# Patient Record
Sex: Male | Born: 1935 | Race: White | Hispanic: No | Marital: Married | Smoking: Former smoker
Health system: Southern US, Community
[De-identification: ages and names within clinical notes are randomized; demographics above are authoritative.]

## PROBLEM LIST (undated history)

## (undated) DIAGNOSIS — N189 Chronic kidney disease, unspecified: Secondary | ICD-10-CM

## (undated) DIAGNOSIS — M1990 Unspecified osteoarthritis, unspecified site: Secondary | ICD-10-CM

## (undated) DIAGNOSIS — J189 Pneumonia, unspecified organism: Secondary | ICD-10-CM

## (undated) DIAGNOSIS — I2510 Atherosclerotic heart disease of native coronary artery without angina pectoris: Secondary | ICD-10-CM

## (undated) DIAGNOSIS — F419 Anxiety disorder, unspecified: Secondary | ICD-10-CM

## (undated) DIAGNOSIS — R011 Cardiac murmur, unspecified: Secondary | ICD-10-CM

## (undated) DIAGNOSIS — I219 Acute myocardial infarction, unspecified: Secondary | ICD-10-CM

## (undated) DIAGNOSIS — I10 Essential (primary) hypertension: Secondary | ICD-10-CM

## (undated) DIAGNOSIS — K219 Gastro-esophageal reflux disease without esophagitis: Secondary | ICD-10-CM

## (undated) DIAGNOSIS — B0229 Other postherpetic nervous system involvement: Secondary | ICD-10-CM

## (undated) DIAGNOSIS — E7800 Pure hypercholesterolemia, unspecified: Secondary | ICD-10-CM

## (undated) DIAGNOSIS — IMO0002 Reserved for concepts with insufficient information to code with codable children: Secondary | ICD-10-CM

## (undated) DIAGNOSIS — N200 Calculus of kidney: Secondary | ICD-10-CM

## (undated) DIAGNOSIS — C801 Malignant (primary) neoplasm, unspecified: Secondary | ICD-10-CM

## (undated) DIAGNOSIS — R0602 Shortness of breath: Secondary | ICD-10-CM

## (undated) DIAGNOSIS — M4800 Spinal stenosis, site unspecified: Secondary | ICD-10-CM

## (undated) HISTORY — DX: Calculus of kidney: N20.0

## (undated) HISTORY — DX: Pure hypercholesterolemia, unspecified: E78.00

## (undated) HISTORY — PX: TONSILLECTOMY: SUR1361

## (undated) HISTORY — PX: LITHOTRIPSY: SUR834

## (undated) HISTORY — PX: HERNIA REPAIR: SHX51

## (undated) HISTORY — PX: NASAL SEPTUM SURGERY: SHX37

## (undated) HISTORY — PX: CORONARY ARTERY BYPASS GRAFT: SHX141

## (undated) HISTORY — DX: Other postherpetic nervous system involvement: B02.29

## (undated) HISTORY — PX: CARDIAC CATHETERIZATION: SHX172

## (undated) HISTORY — DX: Unspecified osteoarthritis, unspecified site: M19.90

---

## 1997-10-22 ENCOUNTER — Encounter (HOSPITAL_COMMUNITY): Admission: RE | Admit: 1997-10-22 | Discharge: 1998-01-20 | Payer: Self-pay | Admitting: Cardiology

## 1999-10-07 ENCOUNTER — Encounter: Payer: Self-pay | Admitting: Family Medicine

## 1999-10-07 ENCOUNTER — Encounter: Admission: RE | Admit: 1999-10-07 | Discharge: 1999-10-07 | Payer: Self-pay | Admitting: Family Medicine

## 1999-11-17 ENCOUNTER — Encounter: Admission: RE | Admit: 1999-11-17 | Discharge: 1999-11-17 | Payer: Self-pay | Admitting: Family Medicine

## 1999-11-17 ENCOUNTER — Encounter: Payer: Self-pay | Admitting: Family Medicine

## 2000-04-11 ENCOUNTER — Encounter (HOSPITAL_COMMUNITY): Admission: RE | Admit: 2000-04-11 | Discharge: 2000-07-10 | Payer: Self-pay | Admitting: Cardiology

## 2000-07-11 ENCOUNTER — Encounter (HOSPITAL_COMMUNITY): Admission: RE | Admit: 2000-07-11 | Discharge: 2000-10-09 | Payer: Self-pay | Admitting: Cardiology

## 2003-08-19 ENCOUNTER — Encounter: Admission: RE | Admit: 2003-08-19 | Discharge: 2003-08-19 | Payer: Self-pay | Admitting: Family Medicine

## 2003-08-21 ENCOUNTER — Encounter: Admission: RE | Admit: 2003-08-21 | Discharge: 2003-08-21 | Payer: Self-pay | Admitting: Family Medicine

## 2004-01-24 ENCOUNTER — Emergency Department (HOSPITAL_COMMUNITY): Admission: EM | Admit: 2004-01-24 | Discharge: 2004-01-24 | Payer: Self-pay | Admitting: Emergency Medicine

## 2004-02-06 ENCOUNTER — Ambulatory Visit (HOSPITAL_COMMUNITY): Admission: RE | Admit: 2004-02-06 | Discharge: 2004-02-06 | Payer: Self-pay | Admitting: Urology

## 2004-11-07 ENCOUNTER — Emergency Department (HOSPITAL_COMMUNITY): Admission: EM | Admit: 2004-11-07 | Discharge: 2004-11-08 | Payer: Self-pay | Admitting: Emergency Medicine

## 2004-12-17 ENCOUNTER — Encounter: Admission: RE | Admit: 2004-12-17 | Discharge: 2004-12-17 | Payer: Self-pay | Admitting: Urology

## 2004-12-23 ENCOUNTER — Ambulatory Visit (HOSPITAL_BASED_OUTPATIENT_CLINIC_OR_DEPARTMENT_OTHER): Admission: RE | Admit: 2004-12-23 | Discharge: 2004-12-23 | Payer: Self-pay | Admitting: Urology

## 2004-12-23 ENCOUNTER — Ambulatory Visit (HOSPITAL_COMMUNITY): Admission: RE | Admit: 2004-12-23 | Discharge: 2004-12-23 | Payer: Self-pay | Admitting: Urology

## 2004-12-24 ENCOUNTER — Emergency Department (HOSPITAL_COMMUNITY): Admission: EM | Admit: 2004-12-24 | Discharge: 2004-12-24 | Payer: Self-pay | Admitting: Emergency Medicine

## 2005-03-01 ENCOUNTER — Ambulatory Visit (HOSPITAL_COMMUNITY): Admission: RE | Admit: 2005-03-01 | Discharge: 2005-03-01 | Payer: Self-pay | Admitting: Urology

## 2005-03-01 ENCOUNTER — Ambulatory Visit (HOSPITAL_BASED_OUTPATIENT_CLINIC_OR_DEPARTMENT_OTHER): Admission: RE | Admit: 2005-03-01 | Discharge: 2005-03-01 | Payer: Self-pay | Admitting: Urology

## 2006-07-26 ENCOUNTER — Encounter (HOSPITAL_COMMUNITY): Admission: RE | Admit: 2006-07-26 | Discharge: 2006-10-24 | Payer: Self-pay | Admitting: Cardiology

## 2008-11-14 ENCOUNTER — Encounter: Admission: RE | Admit: 2008-11-14 | Discharge: 2008-11-14 | Payer: Self-pay | Admitting: Family Medicine

## 2010-06-10 ENCOUNTER — Inpatient Hospital Stay (HOSPITAL_COMMUNITY)
Admission: EM | Admit: 2010-06-10 | Discharge: 2010-06-13 | Payer: Self-pay | Source: Home / Self Care | Attending: Internal Medicine | Admitting: Internal Medicine

## 2010-06-11 ENCOUNTER — Encounter (INDEPENDENT_AMBULATORY_CARE_PROVIDER_SITE_OTHER): Payer: Self-pay | Admitting: Internal Medicine

## 2010-08-31 LAB — PROCALCITONIN: Procalcitonin: 1.65 ng/mL

## 2010-08-31 LAB — COMPREHENSIVE METABOLIC PANEL
Alkaline Phosphatase: 36 U/L — ABNORMAL LOW (ref 39–117)
BUN: 28 mg/dL — ABNORMAL HIGH (ref 6–23)
Calcium: 9.5 mg/dL (ref 8.4–10.5)
Glucose, Bld: 111 mg/dL — ABNORMAL HIGH (ref 70–99)
Total Protein: 5.5 g/dL — ABNORMAL LOW (ref 6.0–8.3)

## 2010-08-31 LAB — HEPATIC FUNCTION PANEL
Alkaline Phosphatase: 44 U/L (ref 39–117)
Total Bilirubin: 1 mg/dL (ref 0.3–1.2)
Total Protein: 6.1 g/dL (ref 6.0–8.3)

## 2010-08-31 LAB — POCT CARDIAC MARKERS
CKMB, poc: 4.2 ng/mL (ref 1.0–8.0)
Troponin i, poc: <0.05 ng/mL (ref 0.00–0.09)

## 2010-08-31 LAB — CULTURE, BLOOD (ROUTINE X 2): Culture: NO GROWTH

## 2010-08-31 LAB — URINALYSIS, ROUTINE W REFLEX MICROSCOPIC
Hgb urine dipstick: NEGATIVE
Nitrite: NEGATIVE

## 2010-08-31 LAB — CARDIAC PANEL(CRET KIN+CKTOT+MB+TROPI)
CK, MB: 6.6 ng/mL (ref 0.3–4.0)
Total CK: 245 U/L — ABNORMAL HIGH (ref 7–232)
Total CK: 293 U/L — ABNORMAL HIGH (ref 7–232)
Troponin I: 0.05 ng/mL (ref 0.00–0.06)
Troponin I: 0.06 ng/mL (ref 0.00–0.06)

## 2010-08-31 LAB — PROTIME-INR
INR: 1.12 (ref 0.00–1.49)
Prothrombin Time: 14.6 seconds (ref 11.6–15.2)

## 2010-08-31 LAB — POCT I-STAT, CHEM 8
BUN: 50 mg/dL — ABNORMAL HIGH (ref 6–23)
Chloride: 114 mEq/L — ABNORMAL HIGH (ref 96–112)
HCT: 52 % (ref 39.0–52.0)
Hemoglobin: 17.7 g/dL — ABNORMAL HIGH (ref 13.0–17.0)
Potassium: 4.2 mEq/L (ref 3.5–5.1)
Sodium: 142 mEq/L (ref 135–145)

## 2010-08-31 LAB — MRSA PCR SCREENING: MRSA by PCR: NEGATIVE

## 2010-08-31 LAB — BASIC METABOLIC PANEL
CO2: 19 mEq/L (ref 19–32)
Calcium: 8.9 mg/dL (ref 8.4–10.5)
Creatinine, Ser: 1.26 mg/dL (ref 0.4–1.5)
GFR calc Af Amer: >60 mL/min (ref >60)
GFR calc non Af Amer: 53 mL/min — ABNORMAL LOW (ref >60)

## 2010-08-31 LAB — TSH: TSH: 2.12 u[IU]/mL (ref 0.350–4.500)

## 2010-08-31 LAB — PROLACTIN: Prolactin: 6.6 ng/mL (ref 2.1–17.1)

## 2010-08-31 LAB — DIFFERENTIAL
Basophils Relative: 0 % (ref 0–1)
Eosinophils Absolute: 0 10*3/uL (ref 0.0–0.7)
Eosinophils Absolute: 0.1 10*3/uL (ref 0.0–0.7)
Eosinophils Relative: 1 % (ref 0–5)
Lymphs Abs: 1 10*3/uL (ref 0.7–4.0)
Monocytes Absolute: 0.4 10*3/uL (ref 0.1–1.0)
Monocytes Relative: 6 % (ref 3–12)
Neutrophils Relative %: 88 % — ABNORMAL HIGH (ref 43–77)

## 2010-08-31 LAB — URINE CULTURE

## 2010-08-31 LAB — LACTIC ACID, PLASMA
Lactic Acid, Venous: 3.1 mmol/L — ABNORMAL HIGH (ref 0.5–2.2)
Lactic Acid, Venous: 4.7 mmol/L — ABNORMAL HIGH (ref 0.5–2.2)

## 2010-08-31 LAB — CBC
HCT: 39.7 % (ref 39.0–52.0)
HCT: 50.8 % (ref 39.0–52.0)
Hemoglobin: 17.1 g/dL — ABNORMAL HIGH (ref 13.0–17.0)
MCH: 32 pg (ref 26.0–34.0)
MCH: 32.3 pg (ref 26.0–34.0)
MCHC: 33 g/dL (ref 30.0–36.0)
MCHC: 33.5 g/dL (ref 30.0–36.0)
MCV: 96.1 fL (ref 78.0–100.0)
Platelets: 135 10*3/uL — ABNORMAL LOW (ref 150–400)
RBC: 4.37 MIL/uL (ref 4.22–5.81)
RBC: 5.29 MIL/uL (ref 4.22–5.81)
RDW: 13.8 % (ref 11.5–15.5)
RDW: 14.2 % (ref 11.5–15.5)
WBC: 5.7 10*3/uL (ref 4.0–10.5)

## 2010-08-31 LAB — GIARDIA/CRYPTOSPORIDIUM SCREEN(EIA): Giardia Screen - EIA: NEGATIVE

## 2010-08-31 LAB — PHOSPHORUS: Phosphorus: 2.5 mg/dL (ref 2.3–4.6)

## 2010-08-31 LAB — BRAIN NATRIURETIC PEPTIDE: Pro B Natriuretic peptide (BNP): 57 pg/mL (ref 0.0–100.0)

## 2010-08-31 LAB — MAGNESIUM: Magnesium: 1.5 mg/dL (ref 1.5–2.5)

## 2010-11-06 NOTE — Op Note (Signed)
Shaun [name], Shaun [name]             ACCOUNT NO.:  [id number]   MEDICAL RECORD NO.:  [id number]          PATIENT TYPE:  AMB   LOCATION:  [facility]                         FACILITY:  [facility]   PHYSICIAN:  Courtney Paris, M.D.DATE OF BIRTH:  1914/08/26   DATE OF PROCEDURE:  03/01/2005  DATE OF DISCHARGE:                                 OPERATIVE REPORT   PREOPERATIVE DIAGNOSES:  Urinary retention secondary to benign prostatic  hypertrophy.   POSTOPERATIVE DIAGNOSES:  Urinary retention secondary to benign prostatic  hypertrophy.   OPERATION:  Suprapubic cystostomy, TUNA.   ANESTHESIA:  General.   SURGEON:  Courtney Paris, M.D.   BRIEF HISTORY:  This 77 year old patient has been in urinary retention since  he had a right ureteroscopy in early July. He has been unable to void  despite Proscar and Flomax and has failed numerous voiding trials. He comes  in now for a TUNA and a suprapubic tube so we can remove his Foley. 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   The patient was placed on the operating table in the dorsal lithotomy  position and after satisfactory induction of general anesthesia was shaved,  prepped and draped in the usual sterile fashion. The Foley catheter was  removed. A Lowsley tractor was then passed per urethra and then cut and  brought up in the suprapubic area where I could feel the tip of the  catheter. I cut down on this with a knife and was able to pop this through  the skin and then placed a #0 Prolene suture through the eye of the Lowsley  and also tied it to a #18 Foley catheter and pulled this back through into  the bladder and out the urethra. The stitch was then cut and then the  catheter was slowly pulled back into the bladder, the balloon inflated and  the tube irrigated to make sure it was located in the bladder. Next, the  cystoscope with the zero degree lens was then passed, no anterior strictures  were  seen. He did have a mildly obstructing prostate with about a 45 gram  prostate on rectal ultrasound with not a very large median lobe component,  mainly lateral lobe enlargement was noted.   The bladder was normal, 2+ trabeculated but otherwise no lesion seen. The  first stick of the needles with the TUNA was done at the mid bladder neck  with 14 mm of needle extension and the tissue was heated up with the machine  in a routine fashion. Two lateral bladder neck sticks were done with 18 mm  needle extension and then he had 2 more transverse left and right with 20 mm  of needle extension, 2 more transverse a little bit further back and then  two apex sticks with also 20 mm of needle extension. There were a total of 9  sticks which the patient tolerated well. The bleeding was very minimal. The  scope was removed and a #22 Foley catheter was inserted and the bladder  irrigated.  A small few clots  were returned but the irrigant was clear. The Foley was  then attached to a leg back. The suprapubic tube was clamped off and taped  to his lower abdomen with gauze. The patient was then returned to the  recovery room in good condition and will come to the office in 3 days and  will have his Foley removed at that time.      Courtney Paris, M.D.  Electronically Signed     HMK/MEDQ  D:  03/01/2005  T:  03/01/2005  Job:  [id number]

## 2011-05-29 ENCOUNTER — Emergency Department (HOSPITAL_COMMUNITY): Payer: Medicare Other

## 2011-05-29 ENCOUNTER — Encounter: Payer: Self-pay | Admitting: *Deleted

## 2011-05-29 ENCOUNTER — Inpatient Hospital Stay (HOSPITAL_COMMUNITY)
Admission: EM | Admit: 2011-05-29 | Discharge: 2011-05-31 | DRG: 153 | Disposition: A | Discharge status: Home / Self Care | Payer: Medicare Other | Attending: Internal Medicine | Admitting: Internal Medicine

## 2011-05-29 DIAGNOSIS — M5137 Other intervertebral disc degeneration, lumbosacral region: Secondary | ICD-10-CM | POA: Diagnosis present

## 2011-05-29 DIAGNOSIS — R0602 Shortness of breath: Secondary | ICD-10-CM | POA: Diagnosis present

## 2011-05-29 DIAGNOSIS — Z7982 Long term (current) use of aspirin: Secondary | ICD-10-CM

## 2011-05-29 DIAGNOSIS — J111 Influenza due to unidentified influenza virus with other respiratory manifestations: Principal | ICD-10-CM | POA: Diagnosis present

## 2011-05-29 DIAGNOSIS — M1990 Unspecified osteoarthritis, unspecified site: Secondary | ICD-10-CM | POA: Diagnosis present

## 2011-05-29 DIAGNOSIS — M51379 Other intervertebral disc degeneration, lumbosacral region without mention of lumbar back pain or lower extremity pain: Secondary | ICD-10-CM | POA: Diagnosis present

## 2011-05-29 DIAGNOSIS — J101 Influenza due to other identified influenza virus with other respiratory manifestations: Secondary | ICD-10-CM

## 2011-05-29 DIAGNOSIS — I2510 Atherosclerotic heart disease of native coronary artery without angina pectoris: Secondary | ICD-10-CM | POA: Diagnosis present

## 2011-05-29 DIAGNOSIS — R509 Fever, unspecified: Secondary | ICD-10-CM | POA: Diagnosis present

## 2011-05-29 DIAGNOSIS — I10 Essential (primary) hypertension: Secondary | ICD-10-CM

## 2011-05-29 HISTORY — DX: Atherosclerotic heart disease of native coronary artery without angina pectoris: I25.10

## 2011-05-29 HISTORY — DX: Reserved for concepts with insufficient information to code with codable children: IMO0002

## 2011-05-29 HISTORY — DX: Essential (primary) hypertension: I10

## 2011-05-29 HISTORY — DX: Unspecified osteoarthritis, unspecified site: M19.90

## 2011-05-29 HISTORY — DX: Cardiac murmur, unspecified: R01.1

## 2011-05-29 HISTORY — DX: Shortness of breath: R06.02

## 2011-05-29 HISTORY — DX: Spinal stenosis, site unspecified: M48.00

## 2011-05-29 LAB — CBC
HCT: 43.2 % (ref 39.0–52.0)
Hemoglobin: 14.3 g/dL (ref 13.0–17.0)
WBC: 11.4 10*3/uL — ABNORMAL HIGH (ref 4.0–10.5)

## 2011-05-29 LAB — BASIC METABOLIC PANEL
BUN: 22 mg/dL (ref 6–23)
Chloride: 105 mEq/L (ref 96–112)
Glucose, Bld: 122 mg/dL — ABNORMAL HIGH (ref 70–99)
Potassium: 4.1 mEq/L (ref 3.5–5.1)

## 2011-05-29 LAB — LACTIC ACID, PLASMA: Lactic Acid, Venous: 1.2 mmol/L (ref 0.5–2.2)

## 2011-05-29 MED ORDER — ACETAMINOPHEN 325 MG PO TABS
975.0000 mg | ORAL_TABLET | Freq: Once | ORAL | Status: AC
  Administered 2011-05-29: 975 mg via ORAL
  Filled 2011-05-29: qty 3

## 2011-05-29 MED ORDER — PIPERACILLIN-TAZOBACTAM 3.375 G IVPB
3.3750 g | Freq: Once | INTRAVENOUS | Status: AC
  Administered 2011-05-29: 3.375 g via INTRAVENOUS
  Filled 2011-05-29: qty 50

## 2011-05-29 MED ORDER — VANCOMYCIN HCL IN DEXTROSE 1-5 GM/200ML-% IV SOLN
1000.0000 mg | Freq: Once | INTRAVENOUS | Status: AC
  Administered 2011-05-29: 1000 mg via INTRAVENOUS
  Filled 2011-05-29: qty 200

## 2011-05-29 NOTE — ED Notes (Signed)
MD at bedside. 

## 2011-05-29 NOTE — ED Notes (Signed)
Patient sent here from [facility] In clinic for further evaluation of his fever, cough, and weakness.

## 2011-05-29 NOTE — ED Provider Notes (Signed)
History     CSN: [id number] Arrival date & time: 05/29/2011  7:21 PM   First MD Initiated Contact with Patient 05/29/11 1932      Chief Complaint  Patient presents with  . Fever  . Shortness of Breath  . Weakness    (Consider location/radiation/quality/duration/timing/severity/associated sxs/prior treatment) The history is provided by the patient and the spouse.   the patient was brought in by his family because of coughing congestion for several days with worsening shortness of breath today and development of generalized weakness and fever.  Found to have a temp of 102.7 rectally in the emergency department.  He denies nausea vomiting and diarrhea.  He denies chest pain.  She denies abdominal pain.  He denies new rash.  He denies neck pain or neck stiffness.  He denies headache.  Nothing worsens the symptoms.  Nothing improves his symptoms.  He's had no dysuria  Past Medical History  Diagnosis Date  . Hypertension     History reviewed. No pertinent past surgical history.  History reviewed. No pertinent family history.  History  Substance Use Topics  . Smoking status: Never Smoker   . Smokeless tobacco: Not on file  . Alcohol Use: No      Review of Systems  Constitutional: Positive for fever.  Respiratory: Positive for shortness of breath.   Neurological: Positive for weakness.  All other systems reviewed and are negative.    Allergies  Review of patient's allergies indicates no known allergies.  Home Medications   Current Outpatient Rx  Name Route Sig Dispense Refill  . ASPIRIN EC 325 MG PO TBEC Oral Take 325 mg by mouth daily.      . OCUVITE PO TABS Oral Take 1 tablet by mouth daily.      Marland Kitchen DICLOFENAC SODIUM 75 MG PO TBEC Oral Take 75 mg by mouth 2 (two) times daily.      Marland Kitchen EZETIMIBE-SIMVASTATIN 10-40 MG PO TABS Oral Take 1 tablet by mouth at bedtime.      Marland Kitchen GABAPENTIN 300 MG PO CAPS Oral Take 300 mg by mouth 3 (three) times daily.      . ISOSORBIDE  MONONITRATE ER 60 MG PO TB24 Oral Take 60 mg by mouth daily.      Marland Kitchen LORATADINE 10 MG PO TABS Oral Take 10 mg by mouth daily.      Marland Kitchen METOPROLOL SUCCINATE ER 50 MG PO TB24 Oral Take 50 mg by mouth daily.      Carma Leaven M PLUS PO TABS Oral Take 1 tablet by mouth daily.      Marland Kitchen NITROGLYCERIN 0.4 MG SL SUBL Sublingual Place 0.4 mg under the tongue every 5 (five) minutes as needed. For chest pain.     Marland Kitchen OMEPRAZOLE 20 MG PO CPDR Oral Take 20 mg by mouth daily.      Marland Kitchen OVER THE COUNTER MEDICATION Oral Take 2 capsules by mouth 2 (two) times daily. Herb-lax over the counter medication     . OVER THE COUNTER MEDICATION Oral Take 3 tablets by mouth daily.      Marland Kitchen POTASSIUM CITRATE 10 MEQ (1080 MG) PO TBCR Oral Take 10 mEq by mouth 2 (two) times daily.      Marland Kitchen VITAMIN C 500 MG PO TABS Oral Take 500 mg by mouth daily.      Marland Kitchen ZOLPIDEM TARTRATE 5 MG PO TABS Oral Take 5 mg by mouth at bedtime as needed. For sleep.       BP 90/77  Pulse 106  Temp(Src) 102.7 F (39.3 C) (Rectal)  Resp 18  Ht 5\' 6"  (1.676 m)  Wt 175 lb (79.379 kg)  BMI 28.25 kg/m2  SpO2 100%  Physical Exam  Nursing note and vitals reviewed. Constitutional: He is oriented to person, place, and time. He appears well-developed and well-nourished.  HENT:  Head: Normocephalic and atraumatic.  Eyes: EOM are normal.  Neck: Normal range of motion.  Cardiovascular: Regular rhythm, normal heart sounds and intact distal pulses.        tachycardia  Pulmonary/Chest: Effort normal and breath sounds normal. No respiratory distress.  Abdominal: Soft. He exhibits no distension. There is no tenderness.  Musculoskeletal: Normal range of motion.  Neurological: He is alert and oriented to person, place, and time.  Skin: Skin is warm and dry.  Psychiatric: He has a normal mood and affect. Judgment normal.    ED Course  Procedures (including critical care time)  Labs Reviewed  CBC - Abnormal; Notable for the following:    WBC 11.4 (*)    All other  components within normal limits  BASIC METABOLIC PANEL - Abnormal; Notable for the following:    Glucose, Bld 122 (*)    GFR calc non Af Amer 71 (*)    GFR calc Af Amer 82 (*)    All other components within normal limits  LACTIC ACID, PLASMA  PROCALCITONIN  CULTURE, BLOOD (ROUTINE X 2)  CULTURE, BLOOD (ROUTINE X 2)  URINALYSIS, ROUTINE W REFLEX MICROSCOPIC  URINE CULTURE  INFLUENZA PANEL BY PCR   Dg Chest 2 View  05/29/2011  *RADIOLOGY REPORT*  Clinical Data: Fever.  Cough.  Shortness of breath.  Generalized weakness.  Former smoker.  Prior CABG.  CHEST - 2 VIEW 05/29/2011:  Comparison: Portable chest x-ray 06/10/2010 [facility]. Two-view chest x-ray 12/17/2004 and 03/02 10/2003 [facility].  Findings: Prior sternotomy for CABG.  Cardiac silhouette normal in size.  Thoracic aorta tortuous and atherosclerotic, unchanged. Hilar and mediastinal contours otherwise unremarkable. Pleuroparenchymal scarring at both bases with blunting of the costophrenic angles, unchanged.  Calcified granuloma in the right mid lung, unchanged.  No new pulmonary parenchymal abnormalities. Pulmonary vascularity normal without evidence of pulmonary edema. Visualized bony thorax intact.  IMPRESSION: No acute cardiopulmonary disease.  Stable pleuroparenchymal scarring in the lung bases and calcified granuloma in the right mid lung.  Original Report Authenticated By: Arnell Sieving, M.D.     1. fever    MDM  Unclear etiology of fever. Pancultured. cxr and urine normal. No AMS to suggest need for LP. Rectal temp 102.7 Influenza titers sent. Will admit for observation  11:54 PM HR 101. BP 116/60 RR 16. Pulse ox 97% on 2 liters [place]       Lyanne Co, MD 05/29/11 2358

## 2011-05-30 ENCOUNTER — Encounter (HOSPITAL_COMMUNITY): Payer: Self-pay | Admitting: Internal Medicine

## 2011-05-30 DIAGNOSIS — R0602 Shortness of breath: Secondary | ICD-10-CM

## 2011-05-30 DIAGNOSIS — I10 Essential (primary) hypertension: Secondary | ICD-10-CM

## 2011-05-30 DIAGNOSIS — I2510 Atherosclerotic heart disease of native coronary artery without angina pectoris: Secondary | ICD-10-CM

## 2011-05-30 DIAGNOSIS — J101 Influenza due to other identified influenza virus with other respiratory manifestations: Secondary | ICD-10-CM

## 2011-05-30 LAB — BASIC METABOLIC PANEL
CO2: 25 mEq/L (ref 19–32)
Calcium: 9.9 mg/dL (ref 8.4–10.5)
Chloride: 105 mEq/L (ref 96–112)
Creatinine, Ser: 0.96 mg/dL (ref 0.50–1.35)
GFR calc Af Amer: 80 mL/min — ABNORMAL LOW (ref >90)
Sodium: 139 mEq/L (ref 135–145)

## 2011-05-30 LAB — URINE MICROSCOPIC-ADD ON

## 2011-05-30 LAB — CBC
MCV: 96.4 fL (ref 78.0–100.0)
Platelets: 171 10*3/uL (ref 150–400)
RBC: 4.18 MIL/uL — ABNORMAL LOW (ref 4.22–5.81)
RDW: 13.7 % (ref 11.5–15.5)
WBC: 9.9 10*3/uL (ref 4.0–10.5)

## 2011-05-30 LAB — INFLUENZA PANEL BY PCR (TYPE A & B): H1N1 flu by pcr: NOT DETECTED

## 2011-05-30 LAB — URINALYSIS, ROUTINE W REFLEX MICROSCOPIC
Bilirubin Urine: NEGATIVE
Glucose, UA: NEGATIVE mg/dL
Ketones, ur: 15 mg/dL — AB
pH: 5.5 (ref 5.0–8.0)

## 2011-05-30 MED ORDER — SENNOSIDES-DOCUSATE SODIUM 8.6-50 MG PO TABS: 1.0000 | ORAL_TABLET | Freq: Every evening | ORAL | Status: DC | PRN

## 2011-05-30 MED ORDER — OSELTAMIVIR PHOSPHATE 75 MG PO CAPS
75.0000 mg | ORAL_CAPSULE | Freq: Two times a day (BID) | ORAL | Status: DC
  Administered 2011-05-30 (×2): 75 mg via ORAL
  Filled 2011-05-30 (×4): qty 1

## 2011-05-30 MED ORDER — ACETAMINOPHEN 325 MG PO TABS: 650.0000 mg | ORAL_TABLET | Freq: Four times a day (QID) | ORAL | Status: DC | PRN

## 2011-05-30 MED ORDER — ACETAMINOPHEN 650 MG RE SUPP: 650.0000 mg | Freq: Four times a day (QID) | RECTAL | Status: DC | PRN

## 2011-05-30 MED ORDER — ALBUTEROL SULFATE (5 MG/ML) 0.5% IN NEBU
2.5000 mg | INHALATION_SOLUTION | Freq: Four times a day (QID) | RESPIRATORY_TRACT | Status: DC | PRN
  Administered 2011-05-30: 2.5 mg via RESPIRATORY_TRACT
  Filled 2011-05-30: qty 0.5

## 2011-05-30 MED ORDER — OSELTAMIVIR PHOSPHATE 75 MG PO CAPS
75.0000 mg | ORAL_CAPSULE | ORAL | Status: AC
  Administered 2011-05-30: 75 mg via ORAL
  Filled 2011-05-30: qty 1

## 2011-05-30 MED ORDER — VANCOMYCIN HCL 1000 MG IV SOLR
750.0000 mg | Freq: Two times a day (BID) | INTRAVENOUS | Status: DC
  Administered 2011-05-30: 750 mg via INTRAVENOUS
  Filled 2011-05-30 (×2): qty 750

## 2011-05-30 MED ORDER — ENOXAPARIN SODIUM 40 MG/0.4ML ~~LOC~~ SOLN
40.0000 mg | SUBCUTANEOUS | Status: DC
  Administered 2011-05-30: 40 mg via SUBCUTANEOUS
  Filled 2011-05-30 (×2): qty 0.4

## 2011-05-30 MED ORDER — ALUM & MAG HYDROXIDE-SIMETH 200-200-20 MG/5ML PO SUSP: 30.0000 mL | Freq: Four times a day (QID) | ORAL | Status: DC | PRN

## 2011-05-30 MED ORDER — OXYCODONE HCL 5 MG PO TABS: 5.0000 mg | ORAL_TABLET | ORAL | Status: DC | PRN

## 2011-05-30 MED ORDER — ONDANSETRON HCL 4 MG/2ML IJ SOLN: 4.0000 mg | Freq: Four times a day (QID) | INTRAMUSCULAR | Status: DC | PRN

## 2011-05-30 MED ORDER — GUAIFENESIN 100 MG/5ML PO SOLN
5.0000 mL | ORAL | Status: DC | PRN
  Administered 2011-05-30: 100 mg via ORAL
  Filled 2011-05-30: qty 5

## 2011-05-30 MED ORDER — ONDANSETRON HCL 4 MG PO TABS: 4.0000 mg | ORAL_TABLET | Freq: Four times a day (QID) | ORAL | Status: DC | PRN

## 2011-05-30 MED ORDER — SODIUM CHLORIDE 0.9 % IV SOLN
INTRAVENOUS | Status: DC
  Administered 2011-05-30: 04:00:00 via INTRAVENOUS

## 2011-05-30 MED ORDER — HYDROMORPHONE HCL PF 1 MG/ML IJ SOLN: 0.5000 mg | INTRAMUSCULAR | Status: DC | PRN

## 2011-05-30 MED ORDER — PIPERACILLIN-TAZOBACTAM 3.375 G IVPB
3.3750 g | Freq: Three times a day (TID) | INTRAVENOUS | Status: DC
  Administered 2011-05-30 (×2): 3.375 g via INTRAVENOUS
  Filled 2011-05-30 (×3): qty 50

## 2011-05-30 NOTE — ED Notes (Signed)
Pt with wheezing bil lungs; pt given PRN breathing treatment

## 2011-05-30 NOTE — Progress Notes (Signed)
Subjective: Feels much better than yesterday, although he still has some mild shortness of breath and myalgias. He has tested positive for influenza A.  Objective: Vital signs in last 24 hours: Temp:  [98.6 F (37 C)-102.7 F (39.3 C)] 98.8 F (37.1 C) (12/09 0545) Pulse Rate:  [94-136] 111  (12/09 0545) Resp:  [16-24] 16  (12/09 0545) BP: (90-142)/(48-109) 135/64 mmHg (12/09 0545) SpO2:  [91 %-100 %] 91 % (12/09 0545) Weight:  [79.379 kg (175 lb)] 175 lb (79.379 kg) (12/08 1922) Weight change:     Intake/Output from previous day: 12/08 0701 - 12/09 0700 In: 240 [P.O.:240] Out: -  Total I/O In: 240 [P.O.:240] Out: -    Physical Exam: General: Alert, awake, oriented x3, in no acute distress. HEENT: No bruits, no goiter. Heart: Regular rate and rhythm, without murmurs, rubs, gallops. Lungs: Clear to auscultation bilaterally. Abdomen: Soft, nontender, nondistended, positive bowel sounds. Extremities: No clubbing cyanosis or edema with positive pedal pulses. Neuro: Grossly intact, nonfocal.    Lab Results: Basic Metabolic Panel:  Basename 05/30/11 0620 05/29/11 2019  NA 139 137  K 3.9 4.1  CL 105 105  CO2 25 23  GLUCOSE 111* 122*  BUN 19 22  CREATININE 0.96 0.89  CALCIUM 9.9 10.1  MG -- --  PHOS -- --   CBC:  Basename 05/30/11 0620 05/29/11 2019  WBC 9.9 11.4*  NEUTROABS -- --  HGB 13.4 14.3  HCT 40.3 43.2  MCV 96.4 96.6  PLT 171 191    Studies/Results: Dg Chest 2 View  05/29/2011  *RADIOLOGY REPORT*  Clinical Data: Fever.  Cough.  Shortness of breath.  Generalized weakness.  Former smoker.  Prior CABG.  CHEST - 2 VIEW 05/29/2011:  Comparison: Portable chest x-ray 06/10/2010 [facility]. Two-view chest x-ray 12/17/2004 and 03/02 10/2003 [facility].  Findings: Prior sternotomy for CABG.  Cardiac silhouette normal in size.  Thoracic aorta tortuous and atherosclerotic, unchanged. Hilar and mediastinal contours otherwise unremarkable.  Pleuroparenchymal scarring at both bases with blunting of the costophrenic angles, unchanged.  Calcified granuloma in the right mid lung, unchanged.  No new pulmonary parenchymal abnormalities. Pulmonary vascularity normal without evidence of pulmonary edema. Visualized bony thorax intact.  IMPRESSION: No acute cardiopulmonary disease.  Stable pleuroparenchymal scarring in the lung bases and calcified granuloma in the right mid lung.  Original Report Authenticated By: Arnell Sieving, M.D.    Medications: Scheduled Meds:   . acetaminophen  975 mg Oral Once  . enoxaparin  40 mg Subcutaneous Q24H  . oseltamivir  75 mg Oral To Minor  . oseltamivir  75 mg Oral BID  . piperacillin-tazobactam (ZOSYN)  IV  3.375 g Intravenous Once  . piperacillin-tazobactam (ZOSYN)  IV  3.375 g Intravenous Q8H  . vancomycin  750 mg Intravenous Q12H  . vancomycin  1,000 mg Intravenous Once   Continuous Infusions:   . sodium chloride 75 mL/hr at 05/30/11 0343   PRN Meds:.acetaminophen, acetaminophen, albuterol, alum & mag hydroxide-simeth, guaiFENesin, HYDROmorphone, ondansetron (ZOFRAN) IV, ondansetron, oxyCODONE, senna-docusate  Assessment/Plan:  Principal Problem:  *Influenza A Active Problems:  SOB (shortness of breath)  HTN (hypertension)  CAD (coronary artery disease)    #1 influenza: He has tested positive for influenza A. This is likely the etiology of his shortness of breath and fevers. His chest x-ray shows no evidence for pneumonia so will go ahead and discontinue antibiotics and continue with Tamiflu only. If he maintains afebrile overnight and can potentially discharge home in the morning.  LOS: 1 day   Chaya Jan Pager: [phone] 05/30/2011, 1:48 PM

## 2011-05-30 NOTE — ED Notes (Signed)
Patient to be moved to yellow for holding waiting for admit orders report given to nurse

## 2011-05-30 NOTE — Progress Notes (Signed)
ANTIBIOTIC CONSULT NOTE - INITIAL  Pharmacy Consult for Vancocin/Zosyn Indication: empiric  No Known Allergies  Patient Measurements: Height: 5\' 6"  (167.6 cm) Weight: 175 lb (79.379 kg) IBW/kg (Calculated) : 63.8   Vital Signs: Temp: 98.7 F (37.1 C) (12/09 0015) Temp src: Oral (12/09 0015) BP: 120/73 mmHg (12/09 0028) Pulse Rate: 99  (12/09 0028)  Labs:  Basename 05/29/11 2019  WBC 11.4*  HGB 14.3  PLT 191  LABCREA --  CREATININE 0.89   Estimated Creatinine Clearance: 50.2 ml/min (by C-G formula based on Cr of 0.89).  Microbiology: No results found for this or any previous visit (from the past 720 hour(s)).  Medical History: Past Medical History  Diagnosis Date  . Hypertension    Medications:  Pending  Assessment: 77yo male c/o coughing and chest congestion associated with worsening SOB and generalized weakness and fever, found with elevated WBC, initial CXR negative for PNA, to begin IV ABX empirically for fever/leukocytosis of unclear etiology.  Goal of Therapy:  Vancomycin trough level 15-20 mcg/ml  Plan:  Rec'd vanc 1g and Zosyn 3.375g in ED.  Will continue therapy with vancomycin 750mg  IV Q12H and Zosyn 3.375g IV Q8H.  Monitor CBC, Cx, levels prn.  Colleen Can PharmD BCPS 05/30/2011,1:20 AM

## 2011-05-30 NOTE — H&P (Signed)
DATE OF ADMISSION:  05/30/2011  PCP:    No primary provider on file.   Chief Complaint:    HPI: Shaun [name] is an 77 y.o. male presenting with complaints of SOB, increased Cough and Chest Congestion along with Fevers and Chills X 2 days.  He had a Temperature to 102.7.  He denies and loss of appetite, nausea or vomiting or diarrhea.  He reports not being able to cough up any mucous.        Past Medical History  Diagnosis Date  . Hypertension   . CAD (coronary artery disease)   . Osteoarthritis   . DDD (degenerative disc disease)   . Spinal stenosis     Past Surgical History  Procedure Date  . Coronary artery bypass graft     Medications:  HOME MEDS: Prior to Admission medications   Medication Sig Start Date End Date Taking? Authorizing Provider  aspirin EC 325 MG tablet Take 325 mg by mouth daily.     Yes Historical Provider, MD  beta carotene w/minerals (OCUVITE) tablet Take 1 tablet by mouth daily.     Yes Historical Provider, MD  diclofenac (VOLTAREN) 75 MG EC tablet Take 75 mg by mouth 2 (two) times daily.     Yes Historical Provider, MD  ezetimibe-simvastatin (VYTORIN) 10-40 MG per tablet Take 1 tablet by mouth at bedtime.     Yes Historical Provider, MD  gabapentin (NEURONTIN) 300 MG capsule Take 300 mg by mouth 3 (three) times daily.     Yes Historical Provider, MD  isosorbide mononitrate (IMDUR) 60 MG 24 hr tablet Take 60 mg by mouth daily.     Yes Historical Provider, MD  loratadine (CLARITIN) 10 MG tablet Take 10 mg by mouth daily.     Yes Historical Provider, MD  metoprolol (TOPROL-XL) 50 MG 24 hr tablet Take 50 mg by mouth daily.     Yes Historical Provider, MD  Multiple Vitamins-Minerals (MULTIVITAMINS THER. W/MINERALS) TABS Take 1 tablet by mouth daily.     Yes Historical Provider, MD  nitroGLYCERIN (NITROSTAT) 0.4 MG SL tablet Place 0.4 mg under the tongue every 5 (five) minutes as needed. For chest pain.    Yes Historical Provider, MD  omeprazole  (PRILOSEC) 20 MG capsule Take 20 mg by mouth daily.     Yes Historical Provider, MD  OVER THE COUNTER MEDICATION Take 2 capsules by mouth 2 (two) times daily. Herb-lax over the counter medication    Yes Historical Provider, MD  OVER THE COUNTER MEDICATION Take 3 tablets by mouth daily.     Yes Historical Provider, MD  potassium citrate (UROCIT-K) 10 MEQ (1080 MG) SR tablet Take 10 mEq by mouth 2 (two) times daily.     Yes Historical Provider, MD  vitamin C (ASCORBIC ACID) 500 MG tablet Take 500 mg by mouth daily.     Yes Historical Provider, MD  zolpidem (AMBIEN) 5 MG tablet Take 5 mg by mouth at bedtime as needed. For sleep.    Yes Historical Provider, MD    Allergies:  No Known Allergies  Social History:   reports that he has never smoked. He does not have any smokeless tobacco history on file. He reports that he does not drink alcohol. His drug history not on file.  Family History: Family History  Problem Relation Age of Onset  . Cancer Father   . Coronary artery disease Father   . Stroke Mother     Review of Systems:  The patient  denies anorexia, fever, weight loss, vision loss, decreased hearing, hoarseness, chest pain, syncope, dyspnea on exertion, peripheral edema, balance deficits, hemoptysis, abdominal pain, melena, hematochezia, severe indigestion/heartburn, hematuria, incontinence, genital sores, muscle weakness, suspicious skin lesions, transient blindness, difficulty walking, depression, unusual weight change, abnormal bleeding, enlarged lymph nodes, angioedema.   Physical Exam:  GEN:  Pleasant examined  and in no acute distress; cooperative with exam Filed Vitals:   05/29/11 2230 05/29/11 2348 05/30/11 0015 05/30/11 0028  BP: 105/80 90/77 109/48 120/73  Pulse: 101 106 94 99  Temp:   98.7 F (37.1 C)   TempSrc:   Oral   Resp:  18  24  Height:      Weight:      SpO2: 96% 100% 96% 94%   Blood pressure 120/73, pulse 99, temperature 98.7 F (37.1 C), temperature  source Oral, resp. rate 24, height 5\' 6"  (1.676 m), weight 79.379 kg (175 lb), SpO2 94.00%. PSYCH: He is alert and oriented x4; does not appear anxious does not appear depressed; affect is normal HEENT: Normocephalic and Atraumatic, Mucous membranes pink; PERRLA; EOM intact; Fundi:  Benign;  No scleral icterus, Nares: Patent, Oropharynx: Clear, Fair Dentition, Neck:  FROM, no cervical lymphadenopathy nor thyromegaly or carotid bruit; no JVD; Breasts:: Not examined CHEST WALL: No tenderness CHEST: Normal respiration, clear to auscultation bilaterally HEART: Regular rate and rhythm; no murmurs rubs or gallops BACK: No kyphosis or scoliosis; no CVA tenderness ABDOMEN: Positive Bowel Sounds, Obese, soft non-tender; no masses, no organomegaly. Rectal Exam: Not done EXTREMITIES: No bone or joint deformity; age-appropriate arthropathy of the hands and knees; no cyanosis, clubbing or edema; no ulcerations. Genitalia: not examined PULSES: 2+ and symmetric SKIN: Normal hydration no rash or ulceration CNS: Cranial nerves 2-12 grossly intact no focal neurologic deficit   Labs & Imaging Results for orders placed during the hospital encounter of 05/29/11 (from the past 48 hour(s))  CBC     Status: Abnormal   Collection Time   05/29/11  8:19 PM      Component Value Range Comment   WBC 11.4 (*) 4.0 - 10.5 (K/uL)    RBC 4.47  4.22 - 5.81 (MIL/uL)    Hemoglobin 14.3  13.0 - 17.0 (g/dL)    HCT 04.5  40.9 - 81.1 (%)    MCV 96.6  78.0 - 100.0 (fL)    MCH 32.0  26.0 - 34.0 (pg)    MCHC 33.1  30.0 - 36.0 (g/dL)    RDW 91.4  78.2 - 95.6 (%)    Platelets 191  150 - 400 (K/uL)   BASIC METABOLIC PANEL     Status: Abnormal   Collection Time   05/29/11  8:19 PM      Component Value Range Comment   Sodium 137  135 - 145 (mEq/L)    Potassium 4.1  3.5 - 5.1 (mEq/L)    Chloride 105  96 - 112 (mEq/L)    CO2 23  19 - 32 (mEq/L)    Glucose, Bld 122 (*) 70 - 99 (mg/dL)    BUN 22  6 - 23 (mg/dL)    Creatinine, Ser  2.13  0.50 - 1.35 (mg/dL)    Calcium 08.6  8.4 - 10.5 (mg/dL)    GFR calc non Af Amer 71 (*) >90 (mL/min)    GFR calc Af Amer 82 (*) >90 (mL/min)   LACTIC ACID, PLASMA     Status: Normal   Collection Time   05/29/11  8:20 PM  Component Value Range Comment   Lactic Acid, Venous 1.2  0.5 - 2.2 (mmol/L)   PROCALCITONIN     Status: Normal   Collection Time   05/29/11  8:21 PM      Component Value Range Comment   Procalcitonin 0.10     URINALYSIS, ROUTINE W REFLEX MICROSCOPIC     Status: Abnormal   Collection Time   05/29/11 11:50 PM      Component Value Range Comment   Color, Urine YELLOW  YELLOW     APPearance CLEAR  CLEAR     Specific Gravity, Urine 1.020  1.005 - 1.030     pH 5.5  5.0 - 8.0     Glucose, UA NEGATIVE  NEGATIVE (mg/dL)    Hgb urine dipstick NEGATIVE  NEGATIVE     Bilirubin Urine NEGATIVE  NEGATIVE     Ketones, ur 15 (*) NEGATIVE (mg/dL)    Protein, ur NEGATIVE  NEGATIVE (mg/dL)    Urobilinogen, UA 0.2  0.0 - 1.0 (mg/dL)    Nitrite NEGATIVE  NEGATIVE     Leukocytes, UA TRACE (*) NEGATIVE    URINE MICROSCOPIC-ADD ON     Status: Normal   Collection Time   05/29/11 11:50 PM      Component Value Range Comment   WBC, UA 3-6  <3 (WBC/hpf)    RBC / HPF 0-2  <3 (RBC/hpf)    Bacteria, UA RARE  RARE     Dg Chest 2 View  05/29/2011  *RADIOLOGY REPORT*  Clinical Data: Fever.  Cough.  Shortness of breath.  Generalized weakness.  Former smoker.  Prior CABG.  CHEST - 2 VIEW 05/29/2011:  Comparison: Portable chest x-ray 06/10/2010 [facility]. Two-view chest x-ray 12/17/2004 and 03/02 10/2003 [facility].  Findings: Prior sternotomy for CABG.  Cardiac silhouette normal in size.  Thoracic aorta tortuous and atherosclerotic, unchanged. Hilar and mediastinal contours otherwise unremarkable. Pleuroparenchymal scarring at both bases with blunting of the costophrenic angles, unchanged.  Calcified granuloma in the right mid lung, unchanged.  No new pulmonary parenchymal  abnormalities. Pulmonary vascularity normal without evidence of pulmonary edema. Visualized bony thorax intact.  IMPRESSION: No acute cardiopulmonary disease.  Stable pleuroparenchymal scarring in the lung bases and calcified granuloma in the right mid lung.  Original Report Authenticated By: Arnell Sieving, M.D.      Assessment/Plan:  1.  Fever-  Early Pneumonia versus Influenzae versus Acute Bronchitis -  Blood Cultures X 2, IV Vancomycin and Zosyn started, and Oral Tamiflu given, and droplet precautions initiated. Antipyretics ordered.    2.  SOB due to #1, Nebs and supplemental O2 PRN.   3.  Tachycardia - Improving with IVFs.   4.  Leukocytosis - Due to infectious process.   5.  CAD hx - Stable  6.  Osteoarthritis Hx - Stable.  7.  Spinal Stenosis with Chronic Back Pain Hx-Stable.  8.  Other plans as per orders.    CODE STATUS:      FULL CODE        Shaylene Paganelli C 05/30/2011, 1:46 AM

## 2011-05-31 LAB — BASIC METABOLIC PANEL
CO2: 27 mEq/L (ref 19–32)
Chloride: 104 mEq/L (ref 96–112)
GFR calc Af Amer: 84 mL/min — ABNORMAL LOW (ref >90)
Potassium: 3.6 mEq/L (ref 3.5–5.1)

## 2011-05-31 LAB — URINE CULTURE
Colony Count: NO GROWTH
Culture  Setup Time: 201212091127

## 2011-05-31 LAB — CBC
HCT: 41.4 % (ref 39.0–52.0)
Hemoglobin: 13.6 g/dL (ref 13.0–17.0)
MCV: 96.7 fL (ref 78.0–100.0)
RBC: 4.28 MIL/uL (ref 4.22–5.81)
WBC: 6 10*3/uL (ref 4.0–10.5)

## 2011-05-31 MED ORDER — OSELTAMIVIR PHOSPHATE 75 MG PO CAPS: 75.0000 mg | ORAL_CAPSULE | Freq: Two times a day (BID) | ORAL | Status: AC

## 2011-05-31 NOTE — Discharge Summary (Signed)
Physician Discharge Summary  Patient ID: Shaun [name] MRN: [id number] DOB/AGE: 1914/11/28 77 y.o.  Admit date: 05/29/2011 Discharge date: 05/31/2011  Primary Care Physician:  No primary provider on file.   Discharge Diagnoses:    Principal Problem:  *Influenza Shaun Active Problems:  SOB (shortness of breath)  HTN (hypertension)  CAD (coronary artery disease)    Discharge Medication List as of 05/31/2011  9:22 AM    START taking these medications   Details  oseltamivir (TAMIFLU) 75 MG capsule Take 1 capsule (75 mg total) by mouth 2 (two) times daily., Starting 05/31/2011, Until Thu 06/10/11, Print      CONTINUE these medications which have NOT CHANGED   Details  aspirin EC 325 MG tablet Take 325 mg by mouth daily.  , Until Discontinued, Historical Med    !! beta carotene w/minerals (OCUVITE) tablet Take 1 tablet by mouth daily.  , Until Discontinued, Historical Med    diclofenac (VOLTAREN) 75 MG EC tablet Take 75 mg by mouth 2 (two) times daily.  , Until Discontinued, Historical Med    ezetimibe-simvastatin (VYTORIN) 10-40 MG per tablet Take 1 tablet by mouth at bedtime.  , Until Discontinued, Historical Med    gabapentin (NEURONTIN) 300 MG capsule Take 300 mg by mouth 3 (three) times daily.  , Until Discontinued, Historical Med    isosorbide mononitrate (IMDUR) 60 MG 24 hr tablet Take 60 mg by mouth daily.  , Until Discontinued, Historical Med    loratadine (CLARITIN) 10 MG tablet Take 10 mg by mouth daily.  , Until Discontinued, Historical Med    metoprolol (TOPROL-XL) 50 MG 24 hr tablet Take 50 mg by mouth daily.  , Until Discontinued, Historical Med    !! Multiple Vitamins-Minerals (MULTIVITAMINS THER. W/MINERALS) TABS Take 1 tablet by mouth daily.  , Until Discontinued, Historical Med    nitroGLYCERIN (NITROSTAT) 0.4 MG SL tablet Place 0.4 mg under the tongue every 5 (five) minutes as needed. For chest pain. , Until Discontinued, Historical Med    omeprazole  (PRILOSEC) 20 MG capsule Take 20 mg by mouth daily.  , Until Discontinued, Historical Med    potassium citrate (UROCIT-K) 10 MEQ (1080 MG) SR tablet Take 10 mEq by mouth 2 (two) times daily.  , Until Discontinued, Historical Med    vitamin C (ASCORBIC ACID) 500 MG tablet Take 500 mg by mouth daily.  , Until Discontinued, Historical Med    zolpidem (AMBIEN) 5 MG tablet Take 5 mg by mouth at bedtime as needed. For sleep. , Until Discontinued, Historical Med     !! - Potential duplicate medications found. Please discuss with provider.    STOP taking these medications     OVER THE COUNTER MEDICATION      OVER THE COUNTER MEDICATION          Disposition and Follow-up:  Patient has been discharged today in stable and improved condition.  Consults:  none    Significant Diagnostic Studies:  Dg Chest 2 View  05/29/2011  *RADIOLOGY REPORT*  Clinical Data: Fever.  Cough.  Shortness of breath.  Generalized weakness.  Former smoker.  Prior CABG.  CHEST - 2 VIEW 05/29/2011:  Comparison: Portable chest x-ray 06/10/2010 [facility]. Two-view chest x-ray 12/17/2004 and 03/02 10/2003 [facility].  Findings: Prior sternotomy for CABG.  Cardiac silhouette normal in size.  Thoracic aorta tortuous and atherosclerotic, unchanged. Hilar and mediastinal contours otherwise unremarkable. Pleuroparenchymal scarring at both bases with blunting of the costophrenic angles, unchanged.  Calcified granuloma in the right mid lung, unchanged.  No new pulmonary parenchymal abnormalities. Pulmonary vascularity normal without evidence of pulmonary edema. Visualized bony thorax intact.  IMPRESSION: No acute cardiopulmonary disease.  Stable pleuroparenchymal scarring in the lung bases and calcified granuloma in the right mid lung.  Original Report Authenticated By: Arnell Sieving, M.D.    Brief H and P: For complete details please refer to admission H and P, but in brief patient is Shaun pleasant 77 year old  white [name] who looks much younger than his stated age who presented to the hospital with fever, shortness of breath, cough for 2 days, had Shaun temperature of 102.7. Because of this was admitted for further evaluation and management.    Hospital Course:  Principal Problem:  *Influenza Shaun Active Problems:  SOB (shortness of breath)  HTN (hypertension)  CAD (coronary artery disease)   #1 influenza: He did test positive for influenza Shaun. This viral illness can certainly explain all his symptoms. His chest x-ray was clear without evidence for pneumonia. He has been discharged on Tamiflu.  All rest of his chronic medical conditions have been stable and his home medications have not been changed.  Time spent on Discharge: Greater than 30 minutes.  SignedChaya Jan [facility] Pager: [phone] 05/31/2011, 5:47 PM

## 2011-05-31 NOTE — Progress Notes (Signed)
   CARE MANAGEMENT NOTE 05/31/2011  Patient:  Shaun [name], Shaun [name]   Account Number:  [id number]  Date Initiated:  05/31/2011  Documentation initiated by:  Letha Cape  Subjective/Objective Assessment:   dx flu  admit- lives with spouse.     Action/Plan:   Anticipated DC Date:  05/31/2011   Anticipated DC Plan:  HOME/SELF CARE      DC Planning Services  CM consult      Choice offered to / List presented to:             Status of service:  Completed, signed off Medicare Important Message given?   (If response is "NO", the following Medicare IM given date fields will be blank) Date Medicare IM given:   Date Additional Medicare IM given:    Discharge Disposition:  HOME/SELF CARE  Per UR Regulation:    Comments:  05/31/11 13:56 Debotrah Ladona Ridgel RN, BSN [phone] patient lives with spouse, discharged home with spouse today.

## 2011-05-31 NOTE — Progress Notes (Signed)
Utilization review completed.  

## 2011-06-05 LAB — CULTURE, BLOOD (ROUTINE X 2)
Culture  Setup Time: 201212090119
Culture: NO GROWTH

## 2011-11-05 ENCOUNTER — Encounter (HOSPITAL_COMMUNITY): Payer: Self-pay | Admitting: Adult Health

## 2011-11-05 ENCOUNTER — Inpatient Hospital Stay: Admission: AD | Admit: 2011-11-05 | Payer: Self-pay | Source: Ambulatory Visit | Admitting: Interventional Cardiology

## 2011-11-05 ENCOUNTER — Inpatient Hospital Stay (HOSPITAL_COMMUNITY)
Admission: EM | Admit: 2011-11-05 | Discharge: 2011-11-17 | DRG: 280 | Disposition: A | Discharge status: Skilled Nursing Facility | Payer: MEDICARE | Attending: Interventional Cardiology | Admitting: Interventional Cardiology

## 2011-11-05 ENCOUNTER — Other Ambulatory Visit: Payer: Self-pay | Admitting: Interventional Cardiology

## 2011-11-05 DIAGNOSIS — G4700 Insomnia, unspecified: Secondary | ICD-10-CM | POA: Diagnosis present

## 2011-11-05 DIAGNOSIS — B961 Klebsiella pneumoniae [K. pneumoniae] as the cause of diseases classified elsewhere: Secondary | ICD-10-CM | POA: Diagnosis present

## 2011-11-05 DIAGNOSIS — I2119 ST elevation (STEMI) myocardial infarction involving other coronary artery of inferior wall: Principal | ICD-10-CM | POA: Diagnosis present

## 2011-11-05 DIAGNOSIS — I350 Nonrheumatic aortic (valve) stenosis: Secondary | ICD-10-CM

## 2011-11-05 DIAGNOSIS — I2510 Atherosclerotic heart disease of native coronary artery without angina pectoris: Secondary | ICD-10-CM

## 2011-11-05 DIAGNOSIS — E785 Hyperlipidemia, unspecified: Secondary | ICD-10-CM

## 2011-11-05 DIAGNOSIS — I509 Heart failure, unspecified: Secondary | ICD-10-CM | POA: Diagnosis present

## 2011-11-05 DIAGNOSIS — I359 Nonrheumatic aortic valve disorder, unspecified: Secondary | ICD-10-CM | POA: Diagnosis present

## 2011-11-05 DIAGNOSIS — I4891 Unspecified atrial fibrillation: Secondary | ICD-10-CM | POA: Diagnosis present

## 2011-11-05 DIAGNOSIS — N390 Urinary tract infection, site not specified: Secondary | ICD-10-CM | POA: Diagnosis present

## 2011-11-05 DIAGNOSIS — I4892 Unspecified atrial flutter: Secondary | ICD-10-CM | POA: Diagnosis present

## 2011-11-05 DIAGNOSIS — Z66 Do not resuscitate: Secondary | ICD-10-CM | POA: Diagnosis present

## 2011-11-05 DIAGNOSIS — I10 Essential (primary) hypertension: Secondary | ICD-10-CM | POA: Diagnosis present

## 2011-11-05 DIAGNOSIS — I5023 Acute on chronic systolic (congestive) heart failure: Secondary | ICD-10-CM | POA: Diagnosis present

## 2011-11-05 HISTORY — DX: Acute myocardial infarction, unspecified: I21.9

## 2011-11-05 LAB — CBC
HCT: 40.5 % (ref 39.0–52.0)
Hemoglobin: 13.9 g/dL (ref 13.0–17.0)
RDW: 13.2 % (ref 11.5–15.5)
WBC: 12.5 10*3/uL — ABNORMAL HIGH (ref 4.0–10.5)

## 2011-11-05 LAB — CARDIAC PANEL(CRET KIN+CKTOT+MB+TROPI): Relative Index: 10.7 — ABNORMAL HIGH (ref 0.0–2.5)

## 2011-11-05 MED ORDER — NITROGLYCERIN 0.4 MG SL SUBL
0.4000 mg | SUBLINGUAL_TABLET | SUBLINGUAL | Status: DC | PRN
  Administered 2011-11-08 (×2): 0.4 mg via SUBLINGUAL
  Filled 2011-11-05: qty 25

## 2011-11-05 MED ORDER — SODIUM CHLORIDE 0.9 % IV SOLN
INTRAVENOUS | Status: DC
  Administered 2011-11-05 – 2011-11-08 (×2): via INTRAVENOUS

## 2011-11-05 MED ORDER — HEPARIN BOLUS VIA INFUSION
3000.0000 [IU] | Freq: Once | INTRAVENOUS | Status: AC
  Administered 2011-11-05: 3000 [IU] via INTRAVENOUS

## 2011-11-05 MED ORDER — ONDANSETRON HCL 4 MG/2ML IJ SOLN: 4.0000 mg | Freq: Four times a day (QID) | INTRAMUSCULAR | Status: DC | PRN

## 2011-11-05 MED ORDER — ASPIRIN 300 MG RE SUPP: 300.0000 mg | RECTAL | Status: AC

## 2011-11-05 MED ORDER — ACETAMINOPHEN 325 MG PO TABS
650.0000 mg | ORAL_TABLET | ORAL | Status: DC | PRN
  Administered 2011-11-06 – 2011-11-17 (×5): 650 mg via ORAL
  Filled 2011-11-05 (×5): qty 2

## 2011-11-05 MED ORDER — ASPIRIN EC 81 MG PO TBEC
81.0000 mg | DELAYED_RELEASE_TABLET | Freq: Every day | ORAL | Status: DC
  Administered 2011-11-06 – 2011-11-17 (×12): 81 mg via ORAL
  Filled 2011-11-05 (×12): qty 1

## 2011-11-05 MED ORDER — NITROGLYCERIN IN D5W 200-5 MCG/ML-% IV SOLN
3.0000 ug/min | INTRAVENOUS | Status: DC
  Administered 2011-11-05: 3 ug/min via INTRAVENOUS
  Filled 2011-11-05: qty 250

## 2011-11-05 MED ORDER — ASPIRIN 81 MG PO CHEW
324.0000 mg | CHEWABLE_TABLET | ORAL | Status: AC
  Administered 2011-11-05: 324 mg via ORAL
  Filled 2011-11-05: qty 4

## 2011-11-05 MED ORDER — HEPARIN (PORCINE) IN NACL 100-0.45 UNIT/ML-% IJ SOLN
1050.0000 [IU]/h | INTRAMUSCULAR | Status: AC
  Administered 2011-11-05: 900 [IU]/h via INTRAVENOUS
  Administered 2011-11-06 (×2): 1050 [IU]/h via INTRAVENOUS
  Filled 2011-11-05 (×3): qty 250

## 2011-11-05 MED ORDER — METOPROLOL SUCCINATE ER 50 MG PO TB24
50.0000 mg | ORAL_TABLET | Freq: Every day | ORAL | Status: DC
  Administered 2011-11-05 – 2011-11-07 (×3): 50 mg via ORAL
  Filled 2011-11-05 (×4): qty 1

## 2011-11-05 MED ORDER — SIMVASTATIN 40 MG PO TABS
40.0000 mg | ORAL_TABLET | Freq: Every day | ORAL | Status: DC
  Administered 2011-11-05 – 2011-11-06 (×2): 40 mg via ORAL
  Filled 2011-11-05 (×3): qty 1

## 2011-11-05 NOTE — ED Notes (Signed)
Charlynn Court, Medical Secretary called to let Dr., Gwendel Hanson that pt. Has arrived

## 2011-11-05 NOTE — ED Notes (Signed)
[id number] Ready

## 2011-11-05 NOTE — Progress Notes (Signed)
ANTICOAGULATION CONSULT NOTE - Initial Consult  Pharmacy Consult for heparin Indication: chest pain/ACS  Allergies  Allergen Reactions  . Cocaine     Patient Measurements:   Heparin Dosing Weight: 76 kg  Vital Signs: Temp: 97.3 F (36.3 C) (05/17 1711) Temp src: Oral (05/17 1711) BP: 137/82 mmHg (05/17 1711) Pulse Rate: 89  (05/17 1711)  Labs: No results found for this basename: HGB:2,HCT:3,PLT:3,APTT:3,LABPROT:3,INR:3,HEPARINUNFRC:3,CREATININE:3,CKTOTAL:3,CKMB:3,TROPONINI:3 in the last 72 hours  The CrCl is unknown because both a height and weight (above a minimum accepted value) are required for this calculation.   Medical History: Past Medical History  Diagnosis Date  . Hypertension   . CAD (coronary artery disease)   . Osteoarthritis   . DDD (degenerative disc disease)   . Spinal stenosis   . Heart murmur   . Shortness of breath   . Myocardial infarct     Medications:   (Not in a hospital admission)  Assessment: 78 yo man to start heparin for CP. Goal of Therapy:  Heparin level 0.3-0.7 units/ml Monitor platelets by anticoagulation protocol: Yes   Plan:  Heparin 3000 unit bolus and drip at 900 units/hr. Check heparin level and CBC 8 hours after start and ddaily while on heparin. CBC daily while on heparin.  Talbert Cage Poteet 11/05/2011,6:16 PM

## 2011-11-05 NOTE — H&P (Signed)
Admit date: (Not on file) Referring Physician  Dr, D. [facility] Primary Cardiologist Eldridge Dace Chief complaint/reason for admission: Chest pain  HPI: 78 -year-old man with known coronary artery disease.  He had bypass surgery back in 1999.  He has had moderate aortic stenosis.  He was just evaluated in our office about 4 weeks ago.  Her recent echocardiogram showed normal left ventricular function.  Last night, he had discomfort in his chest a rate about a 5/10.  He is in the left side of his chest.  He does not recall what his prior angina was like so he could not tell if it was similar.  He did take several nitroglycerin tablets and after about half an hour, he had some relief.  He went to bed and woke up this morning pain-free.  During the morning he has had some twinges of discomfort in his chest but they have resolved spontaneously without nitroglycerin.  He came to the office for further evaluation.  ECG revealed slight ST elevation in lead 3 with T wave inversions noted in leads 3 and aVF.  This was consistent with inferior injury.  The patient was pain-free at that time.  I explain this finding to him and the fact that it was different from his prior ECGs.  We discussed hospital admission.  He did not want to be admitted to the hospital.  I then explained to him that we could check some blood tests to evaluate for damage to his heart muscle and he agreed to this.  If the blood tests were abnormal, he would agree to admission.  If they were normal, he preferred to go home.  The initial CK from his blood test was 901 which is well above the normal range.  He did agree to come to the emergency room.  He currently has no chest discomfort.  He will be direct admitted to a step down bed.    PMH:    Past Medical History  Diagnosis Date  . Hypertension   . CAD (coronary artery disease)   . Osteoarthritis   . DDD (degenerative disc disease)   . Spinal stenosis   . Heart murmur   . Shortness of  breath     PSH:    Past Surgical History  Procedure Date  . Coronary artery bypass graft   . Tonsillectomy   . Vascular surgery   . Coronary angioplasty     ALLERGIES:   Review of patient's allergies indicates no known allergies.  Prior to Admit Meds:  Toprol XL 50 MG Tablet Extended Release 24 Hour 1 tablet Once a day, Potassium Citrate 10 MEQ (1080 MG) Tablet Extended Release 1 tablet with meals once a day---takes a second tablet as needed, Diclofenac Sodium 75MG  Tablet Delayed Release 1 tablet BID, Loratadine 10 MG Tablet 1 tablet 1 to 2 tablets daily as needed, Nitroglycerin 0.4 MG Tablet Sublingual 1 tablet under the tongue as needed, Ocuvite Tablet 1 tablet Twice a day, Multivitamins Tablet 1 tablet once a day, Vitamin C 500 MG Tablet 1 tablet Once a day, Herbalife fiber/laxative 2 tablets twice a day, Stool Softener 100 MG Capsule 3 tablets daily, Omeprazole 20MG  Capsule Delayed Release TAKE 1 CAPSULE DAILY , Isosorbide Mononitrate 60 MG tablet 1 tablet Once a day, Gabapentin 300 MG Tablet 1 capsule Three times a day, Aspirin 325 mg 325 mg tablet 1 tablet Once a day, Vytorin 10/40MG  Tablet 1 tablet once a day, Osteo Bi-Flex Triple Strength Tablet 1 tablet daily,  Medication List reviewed and reconciled with the patient  Family HX:    Family History  Problem Relation Age of Onset  . Cancer Father   . Coronary artery disease Father   . Stroke Mother    Social HX:    History   Social History  . Marital Status: Married    Spouse Name: N/A    Number of Children: N/A  . Years of Education: N/A   Occupational History  . Not on file.   Social History Main Topics  . Smoking status: Never Smoker   . Smokeless tobacco: Not on file  . Alcohol Use: No  . Drug Use: No  . Sexually Active: Not Currently   Other Topics Concern  . Not on file   Social History Narrative  . No narrative on file     ROS:  All 11 ROS were addressed and are negative except what is stated in the  HPI  PHYSICAL EXAM There were no vitals filed for this visit. General: Well developed, well nourished, in no acute distress Head:   Normal cephalic and atramatic  Lungs:   Clear bilaterally to auscultation and percussion. Heart:   HRRR S1 S2  Abdomen: Bowel sounds are positive, abdomen soft and non-tender without masses or                  Hernia's noted. Msk:  Back normal, slow gait. Normal strength and tone for age. Extremities:   No clubbing, cyanosis or edema.  Neuro: Alert and oriented X 3. Psych:  Good affect, responds appropriately   Labs:   Lab Results  Component Value Date   WBC 6.0 05/31/2011   HGB 13.6 05/31/2011   HCT 41.4 05/31/2011   MCV 96.7 05/31/2011   PLT 164 05/31/2011   No results found for this basename: NA,K,CL,CO2,BUN,CREATININE,CALCIUM,LABALBU,PROT,BILITOT,ALKPHOS,ALT,AST,GLUCOSE in the last 168 hours Lab Results  Component Value Date   CKTOTAL 293* 06/10/2010   CKMB 6.6 CRITICAL VALUE NOTED.  VALUE IS CONSISTENT WITH PREVIOUSLY REPORTED AND CALLED VALUE.* 06/10/2010   TROPONINI  Value: 0.06        PERSISTENTLY INCREASED TROPONIN VALUES IN THE RANGE OF 0.06-0.49 ng/mL CAN BE SEEN IN:       -UNSTABLE ANGINA       -CONGESTIVE HEART FAILURE       -MYOCARDITIS       -CHEST TRAUMA       -ARRYHTHMIAS       -LATE PRESENTING MI       -COPD   CLINICAL FOLLOW-UP RECOMMENDED. 06/10/2010   No results found for this basename: PTT   Lab Results  Component Value Date   INR 1.12 06/12/2010     No results found for this basename: CHOL   No results found for this basename: HDL   No results found for this basename: LDLCALC   No results found for this basename: TRIG   No results found for this basename: CHOLHDL   No results found for this basename: LDLDIRECT      Radiology:  @[id number] @  EKG:  Normal sinus rhythm, slight inferior ST elevation with T-wave inversion.  ASSESSMENT: Late presentation of an acute inferior myocardial infarction  PLAN:  Given the  patient's age and the fact that he is now pain-free, we will just medically manage him.  I would plan on heparin for 48 hours.  We used IV nitroglycerin as needed to treat his discomfort.  Continue aspirin, beta blocker, statin.  Would consider adding Plavix  to his regimen if he does not have any significant bleeding problems.  Will watch likely through the weekend.  Recent echocardiogram showed normal left ventricular function and moderate aortic stenosis.  This was done in our office.  I don't think we need to repeat the echocardiogram unless he has significant heart failure symptoms.  Cycle enzymes.  Watch on telemetry.  Corky Crafts., MD  11/05/2011  5:42 PM

## 2011-11-05 NOTE — ED Notes (Signed)
Spoke to Dr. Donnie Aho, made aware of pt's critical lab results (cardiac panel).

## 2011-11-05 NOTE — ED Notes (Signed)
Spoke to 2900, states report will be taken after change of shift. Pt/Family made aware.

## 2011-11-05 NOTE — ED Notes (Signed)
Dr. Everette Rank to admit pt to step down. We are waiting on a bed. Dr. Everette Rank has seen pt and written orders.  Bed control notified.Pt began having chest pain last night on the left side relieved with nitro.  EKG complete. Denies radiation. Associated with mild nausea.

## 2011-11-06 LAB — BASIC METABOLIC PANEL
BUN: 17 mg/dL (ref 6–23)
CO2: 23 mEq/L (ref 19–32)
Chloride: 106 mEq/L (ref 96–112)
Creatinine, Ser: 0.85 mg/dL (ref 0.50–1.35)

## 2011-11-06 LAB — CARDIAC PANEL(CRET KIN+CKTOT+MB+TROPI)
CK, MB: 105.9 ng/mL (ref 0.3–4.0)
Relative Index: 10.8 — ABNORMAL HIGH (ref 0.0–2.5)
Total CK: 982 U/L — ABNORMAL HIGH (ref 7–232)
Troponin I: 18.83 ng/mL (ref <0.30)

## 2011-11-06 LAB — CBC
HCT: 40.2 % (ref 39.0–52.0)
Hemoglobin: 13.6 g/dL (ref 13.0–17.0)
MCH: 31.7 pg (ref 26.0–34.0)
MCHC: 33.8 g/dL (ref 30.0–36.0)

## 2011-11-06 LAB — LIPID PANEL
Cholesterol: 160 mg/dL (ref 0–200)
VLDL: 22 mg/dL (ref 0–40)

## 2011-11-06 LAB — HEPARIN LEVEL (UNFRACTIONATED)
Heparin Unfractionated: 0.19 IU/mL — ABNORMAL LOW (ref 0.30–0.70)
Heparin Unfractionated: 0.62 IU/mL (ref 0.30–0.70)

## 2011-11-06 MED ORDER — HEPARIN BOLUS VIA INFUSION
1500.0000 [IU] | Freq: Once | INTRAVENOUS | Status: AC
  Administered 2011-11-06: 1500 [IU] via INTRAVENOUS
  Filled 2011-11-06: qty 1500

## 2011-11-06 MED ORDER — DIPHENHYDRAMINE HCL 25 MG PO CAPS
25.0000 mg | ORAL_CAPSULE | Freq: Every evening | ORAL | Status: DC | PRN
  Administered 2011-11-06: 25 mg via ORAL
  Filled 2011-11-06: qty 1

## 2011-11-06 MED ORDER — FUROSEMIDE 10 MG/ML IJ SOLN
40.0000 mg | Freq: Once | INTRAMUSCULAR | Status: AC
  Administered 2011-11-06: 40 mg via INTRAVENOUS
  Filled 2011-11-06: qty 4

## 2011-11-06 NOTE — Progress Notes (Signed)
ANTICOAGULATION CONSULT NOTE - Follow Up  Pharmacy Consult for heparin Indication: chest pain/ACS  Allergies  Allergen Reactions  . Cocaine     Patient Measurements: Height: 5\' 6"  (167.6 cm) Weight: 167 lb 1.7 oz (75.8 kg) IBW/kg (Calculated) : 63.8  Heparin Dosing Weight: 76 kg  Vital Signs: Temp: 99.2 F (37.3 C) (05/18 1938) Temp src: Oral (05/18 1938) BP: 98/48 mmHg (05/18 1940) Pulse Rate: 91  (05/18 1100)  Labs:  Basename 11/06/11 2130 11/06/11 1123 11/06/11 0530 11/06/11 0239 11/05/11 2341 11/05/11 1847  HGB -- -- 13.6 -- -- 13.9  HCT -- -- 40.2 -- -- 40.5  PLT -- -- 179 -- -- 173  APTT -- -- -- -- -- 28  LABPROT -- -- -- -- -- 13.2  INR -- -- -- -- -- 0.98  HEPARINUNFRC 0.62 0.80* -- 0.19* -- --  CREATININE -- -- 0.85 -- -- --  CKTOTAL -- -- 973* -- 982* 1057*  CKMB -- -- 90.0* -- 105.9* 113.5*  TROPONINI -- -- 17.35* -- 18.83* 21.76*    Estimated Creatinine Clearance: 48 ml/min (by C-G formula based on Cr of 0.85).   Medical History: Past Medical History  Diagnosis Date  . Hypertension   . CAD (coronary artery disease)   . Osteoarthritis   . DDD (degenerative disc disease)   . Spinal stenosis   . Heart murmur   . Shortness of breath   . Myocardial infarct     Medications:  Prescriptions prior to admission  Medication Sig Dispense Refill  . aspirin EC 325 MG tablet Take 325 mg by mouth daily.        . beta carotene w/minerals (OCUVITE) tablet Take 1 tablet by mouth daily.        . diclofenac (VOLTAREN) 75 MG EC tablet Take 75 mg by mouth 2 (two) times daily.        Marland Kitchen ezetimibe-simvastatin (VYTORIN) 10-40 MG per tablet Take 1 tablet by mouth at bedtime.        . gabapentin (NEURONTIN) 300 MG capsule Take 300 mg by mouth 3 (three) times daily.        . isosorbide mononitrate (IMDUR) 60 MG 24 hr tablet Take 60 mg by mouth daily.        Marland Kitchen loratadine (CLARITIN) 10 MG tablet Take 10 mg by mouth daily.        . metoprolol (TOPROL-XL) 50 MG 24 hr  tablet Take 50 mg by mouth daily.        . Multiple Vitamins-Minerals (MULTIVITAMINS THER. W/MINERALS) TABS Take 1 tablet by mouth daily.        . nitroGLYCERIN (NITROSTAT) 0.4 MG SL tablet Place 0.4 mg under the tongue every 5 (five) minutes as needed. For chest pain.       Marland Kitchen omeprazole (PRILOSEC) 20 MG capsule Take 20 mg by mouth daily.        . potassium citrate (UROCIT-K) 10 MEQ (1080 MG) SR tablet Take 10 mEq by mouth 2 (two) times daily.        . vitamin C (ASCORBIC ACID) 500 MG tablet Take 500 mg by mouth daily.          Assessment: 78 yo male started on heparin for chest pain. Heparin level (0.62) is at-goal on 1050 units/hr.   Goal of Therapy:  Heparin level 0.3-0.7 units/ml Monitor platelets by anticoagulation protocol: Yes   Plan:  1. Continue IV heparin at 1050 units/hr   2. Daily HL, CBC  Kaya Pottenger,  Mellody Drown 11/06/2011,10:26 PM

## 2011-11-06 NOTE — Progress Notes (Signed)
SUBJECTIVE:  Mild intermittent chest discomfort.  Some shortness of breath.  Difficulty urinating due to prostate problems.    OBJECTIVE:   Vitals:   Filed Vitals:   11/06/11 0402 11/06/11 0545 11/06/11 0719 11/06/11 1039  BP: 114/51 126/67 123/87 129/82  Pulse: 86 92  89  Temp: 99 F (37.2 C)  97.9 F (36.6 C)   TempSrc: Oral  Oral   Resp: 18 20 18    Height:      Weight:      SpO2: 95% 94% 96% 95%   I&O's:   Intake/Output Summary (Last 24 hours) at 11/06/11 1052 Last data filed at 11/06/11 1000  Gross per 24 hour  Intake  781.4 ml  Output   1025 ml  Net -243.6 ml   TELEMETRY: Reviewed telemetry pt in NSR:     PHYSICAL EXAM General: Well developed, well nourished, in no acute distress Head: Eyes PERRLA, No xanthomas.   Normal cephalic and atramatic  Lungs:  Bibasilar crackles Heart:   HRRR S1 S2, 2/6 SEM Abdomen:  abdomen soft and non-tender without masses or                  Hernia's noted. Msk:  Back normal, normal gait. Normal strength and tone for age. Extremities:   1+ edema Neuro: Alert and oriented X 3. Psych:  Good affect, responds appropriately   LABS: Basic Metabolic Panel:  Basename 11/06/11 0530  NA 139  K 4.1  CL 106  CO2 23  GLUCOSE 137*  BUN 17  CREATININE 0.85  CALCIUM 10.3  MG --  PHOS --   Liver Function Tests: No results found for this basename: AST:2,ALT:2,ALKPHOS:2,BILITOT:2,PROT:2,ALBUMIN:2 in the last 72 hours No results found for this basename: LIPASE:2,AMYLASE:2 in the last 72 hours CBC:  Basename 11/06/11 0530 11/05/11 1847  WBC 15.8* 12.5*  NEUTROABS -- --  HGB 13.6 13.9  HCT 40.2 40.5  MCV 93.7 94.8  PLT 179 173   Cardiac Enzymes:  Basename 11/06/11 0530 11/05/11 2341 11/05/11 1847  CKTOTAL 973* 982* 1057*  CKMB 90.0* 105.9* 113.5*  CKMBINDEX -- -- --  TROPONINI 17.35* 18.83* 21.76*   BNP: No components found with this basename: POCBNP:3 D-Dimer: No results found for this basename: DDIMER:2 in the last 72  hours Hemoglobin A1C:  Basename 11/05/11 1847  HGBA1C 5.9*   Fasting Lipid Panel:  Basename 11/06/11 0530  CHOL 160  HDL 49  LDLCALC 89  TRIG 111  CHOLHDL 3.3  LDLDIRECT --   Thyroid Function Tests: No results found for this basename: TSH,T4TOTAL,FREET3,T3FREE,THYROIDAB in the last 72 hours Anemia Panel: No results found for this basename: VITAMINB12,FOLATE,FERRITIN,TIBC,IRON,RETICCTPCT in the last 72 hours Coag Panel:   Lab Results  Component Value Date   INR 0.98 11/05/2011   INR 1.12 06/12/2010    RADIOLOGY: No results found.    ASSESSMENT: Late presenting acute inferior MI.  Medical therapy.  PLAN:  1) Given age, and comorbidites, we are planning with conservative therapy at this time.  Enzymes are slowly trending down.  Most severe pain was Thursday night. I do not think that there is any benefit to cardiac cath at this time.  Continue beta blocker, aspirin, statin.  Titrate NTG for chest pain.    2) Some SHOB.  May be from LV dysfunction.  Will treat with IV Lasix to help with [facility].    3) Also discussed code status at length with family present.  He wishes to be DNR/DNI and filled out a  living will in the past.    4) Benadryl at night at home dose for sleep.  Corky Crafts., MD  11/06/2011  10:52 AM

## 2011-11-06 NOTE — Progress Notes (Signed)
ANTICOAGULATION CONSULT NOTE - Follow Up  Pharmacy Consult for heparin Indication: chest pain/ACS  Allergies  Allergen Reactions  . Cocaine     Patient Measurements: Height: 5\' 6"  (167.6 cm) Weight: 167 lb 1.7 oz (75.8 kg) IBW/kg (Calculated) : 63.8  Heparin Dosing Weight: 76 kg  Vital Signs: Temp: 98.4 F (36.9 C) (05/18 1100) Temp src: Oral (05/18 1100) BP: 129/82 mmHg (05/18 1039) Pulse Rate: 91  (05/18 1100)  Labs:  Basename 11/06/11 1123 11/06/11 0530 11/06/11 0239 11/05/11 2341 11/05/11 1847  HGB -- 13.6 -- -- 13.9  HCT -- 40.2 -- -- 40.5  PLT -- 179 -- -- 173  APTT -- -- -- -- 28  LABPROT -- -- -- -- 13.2  INR -- -- -- -- 0.98  HEPARINUNFRC 0.80* -- 0.19* -- --  CREATININE -- 0.85 -- -- --  CKTOTAL -- 973* -- 982* 1057*  CKMB -- 90.0* -- 105.9* 113.5*  TROPONINI -- 17.35* -- 18.83* 21.76*    Estimated Creatinine Clearance: 48 ml/min (by C-G formula based on Cr of 0.85).   Medical History: Past Medical History  Diagnosis Date  . Hypertension   . CAD (coronary artery disease)   . Osteoarthritis   . DDD (degenerative disc disease)   . Spinal stenosis   . Heart murmur   . Shortness of breath   . Myocardial infarct     Medications:  Prescriptions prior to admission  Medication Sig Dispense Refill  . aspirin EC 325 MG tablet Take 325 mg by mouth daily.        . beta carotene w/minerals (OCUVITE) tablet Take 1 tablet by mouth daily.        . diclofenac (VOLTAREN) 75 MG EC tablet Take 75 mg by mouth 2 (two) times daily.        Marland Kitchen ezetimibe-simvastatin (VYTORIN) 10-40 MG per tablet Take 1 tablet by mouth at bedtime.        . gabapentin (NEURONTIN) 300 MG capsule Take 300 mg by mouth 3 (three) times daily.        . isosorbide mononitrate (IMDUR) 60 MG 24 hr tablet Take 60 mg by mouth daily.        Marland Kitchen loratadine (CLARITIN) 10 MG tablet Take 10 mg by mouth daily.        . metoprolol (TOPROL-XL) 50 MG 24 hr tablet Take 50 mg by mouth daily.        . Multiple  Vitamins-Minerals (MULTIVITAMINS THER. W/MINERALS) TABS Take 1 tablet by mouth daily.        . nitroGLYCERIN (NITROSTAT) 0.4 MG SL tablet Place 0.4 mg under the tongue every 5 (five) minutes as needed. For chest pain.       Marland Kitchen omeprazole (PRILOSEC) 20 MG capsule Take 20 mg by mouth daily.        . potassium citrate (UROCIT-K) 10 MEQ (1080 MG) SR tablet Take 10 mEq by mouth 2 (two) times daily.        . vitamin C (ASCORBIC ACID) 500 MG tablet Take 500 mg by mouth daily.          Assessment: 78 yo male started on heparin for chest pain. Heparin level after bolus and increase in rate is slightly supratherapeutic at 0.80. No bleeding noted in documentation.   Goal of Therapy:  Heparin level 0.3-0.7 units/ml Monitor platelets by anticoagulation protocol: Yes   Plan:  1. Decrease heparin to 1050 units/hr   2. Heparin level in 8 hours.   Brett Fairy  N 11/06/2011,12:44 PM

## 2011-11-06 NOTE — Progress Notes (Signed)
ANTICOAGULATION CONSULT NOTE - Follow Up  Pharmacy Consult for heparin Indication: chest pain/ACS  Allergies  Allergen Reactions  . Cocaine     Patient Measurements: Height: 5\' 6"  (167.6 cm) Weight: 167 lb 1.7 oz (75.8 kg) IBW/kg (Calculated) : 63.8  Heparin Dosing Weight: 76 kg  Vital Signs: Temp: 97.6 F (36.4 C) (05/18 0011) Temp src: Oral (05/18 0011) BP: 118/45 mmHg (05/18 0200) Pulse Rate: 91  (05/18 0200)  Labs:  Basename 11/06/11 0239 11/05/11 2341 11/05/11 1847  HGB -- -- 13.9  HCT -- -- 40.5  PLT -- -- 173  APTT -- -- 28  LABPROT -- -- 13.2  INR -- -- 0.98  HEPARINUNFRC 0.19* -- --  CREATININE -- -- --  CKTOTAL -- 982* 1057*  CKMB -- 105.9* 113.5*  TROPONINI -- 18.83* 21.76*    Estimated Creatinine Clearance: 48.5 ml/min (by C-G formula based on Cr of 0.84).   Medical History: Past Medical History  Diagnosis Date  . Hypertension   . CAD (coronary artery disease)   . Osteoarthritis   . DDD (degenerative disc disease)   . Spinal stenosis   . Heart murmur   . Shortness of breath   . Myocardial infarct     Medications:  Prescriptions prior to admission  Medication Sig Dispense Refill  . aspirin EC 325 MG tablet Take 325 mg by mouth daily.        . beta carotene w/minerals (OCUVITE) tablet Take 1 tablet by mouth daily.        . diclofenac (VOLTAREN) 75 MG EC tablet Take 75 mg by mouth 2 (two) times daily.        Marland Kitchen ezetimibe-simvastatin (VYTORIN) 10-40 MG per tablet Take 1 tablet by mouth at bedtime.        . gabapentin (NEURONTIN) 300 MG capsule Take 300 mg by mouth 3 (three) times daily.        . isosorbide mononitrate (IMDUR) 60 MG 24 hr tablet Take 60 mg by mouth daily.        Marland Kitchen loratadine (CLARITIN) 10 MG tablet Take 10 mg by mouth daily.        . metoprolol (TOPROL-XL) 50 MG 24 hr tablet Take 50 mg by mouth daily.        . Multiple Vitamins-Minerals (MULTIVITAMINS THER. W/MINERALS) TABS Take 1 tablet by mouth daily.        . nitroGLYCERIN  (NITROSTAT) 0.4 MG SL tablet Place 0.4 mg under the tongue every 5 (five) minutes as needed. For chest pain.       Marland Kitchen omeprazole (PRILOSEC) 20 MG capsule Take 20 mg by mouth daily.        . potassium citrate (UROCIT-K) 10 MEQ (1080 MG) SR tablet Take 10 mEq by mouth 2 (two) times daily.        . vitamin C (ASCORBIC ACID) 500 MG tablet Take 500 mg by mouth daily.          Assessment: 78 yo man started on heparin for chest pain. Heparin level (0.19) is below-goal on 900 units/hr. No problems with infusion per RN.   Goal of Therapy:  Heparin level 0.3-0.7 units/ml Monitor platelets by anticoagulation protocol: Yes   Plan:  1. Heparin IV bolus of 1500 units x 1, then increase IV infusion to 1150 units/hr.  2. Heparin level in 8 hours.   Emeline Gins 11/06/2011,3:23 AM

## 2011-11-07 ENCOUNTER — Inpatient Hospital Stay (HOSPITAL_COMMUNITY): Payer: MEDICARE

## 2011-11-07 DIAGNOSIS — I359 Nonrheumatic aortic valve disorder, unspecified: Secondary | ICD-10-CM

## 2011-11-07 DIAGNOSIS — I350 Nonrheumatic aortic (valve) stenosis: Secondary | ICD-10-CM

## 2011-11-07 DIAGNOSIS — E785 Hyperlipidemia, unspecified: Secondary | ICD-10-CM

## 2011-11-07 DIAGNOSIS — I10 Essential (primary) hypertension: Secondary | ICD-10-CM

## 2011-11-07 DIAGNOSIS — I2119 ST elevation (STEMI) myocardial infarction involving other coronary artery of inferior wall: Secondary | ICD-10-CM

## 2011-11-07 LAB — CARDIAC PANEL(CRET KIN+CKTOT+MB+TROPI)
Relative Index: 4.5 — ABNORMAL HIGH (ref 0.0–2.5)
Total CK: 601 U/L — ABNORMAL HIGH (ref 7–232)

## 2011-11-07 LAB — BASIC METABOLIC PANEL
Chloride: 99 mEq/L (ref 96–112)
Creatinine, Ser: 0.89 mg/dL (ref 0.50–1.35)
GFR calc Af Amer: 82 mL/min — ABNORMAL LOW (ref >90)
GFR calc non Af Amer: 71 mL/min — ABNORMAL LOW (ref >90)
Potassium: 3.3 mEq/L — ABNORMAL LOW (ref 3.5–5.1)

## 2011-11-07 LAB — HEPARIN LEVEL (UNFRACTIONATED): Heparin Unfractionated: 0.56 IU/mL (ref 0.30–0.70)

## 2011-11-07 MED ORDER — ATORVASTATIN CALCIUM 40 MG PO TABS
40.0000 mg | ORAL_TABLET | Freq: Every day | ORAL | Status: DC
  Administered 2011-11-07 – 2011-11-16 (×9): 40 mg via ORAL
  Filled 2011-11-07 (×11): qty 1

## 2011-11-07 NOTE — Progress Notes (Signed)
Pt showed signes of confusion and restlessness after receiving Benadryl 25mg  and Tylenol 650mg ; pt attempted to get OOB on his own and almost pulled out IV; pt was reoriented; no further difficulties  Shaun [name]

## 2011-11-07 NOTE — Progress Notes (Signed)
ANTICOAGULATION CONSULT NOTE - Follow Up  Pharmacy Consult for heparin Indication: chest pain/ACS  Allergies  Allergen Reactions  . Cocaine     Patient Measurements: Height: 5\' 6"  (167.6 cm) Weight: 167 lb 1.7 oz (75.8 kg) IBW/kg (Calculated) : 63.8  Heparin Dosing Weight: 76 kg  Vital Signs: Temp: 98.9 F (37.2 C) (05/19 1058) Temp src: Oral (05/19 1058) BP: 122/70 mmHg (05/19 1058) Pulse Rate: 93  (05/19 1058)  Labs:  Basename 11/07/11 0630 11/07/11 0500 11/06/11 2130 11/06/11 1123 11/06/11 0530 11/05/11 2341 11/05/11 1847  HGB -- -- -- -- 13.6 -- 13.9  HCT -- -- -- -- 40.2 -- 40.5  PLT -- -- -- -- 179 -- 173  APTT -- -- -- -- -- -- 28  LABPROT -- -- -- -- -- -- 13.2  INR -- -- -- -- -- -- 0.98  HEPARINUNFRC 0.56 -- 0.62 0.80* -- -- --  CREATININE 0.89 -- -- -- 0.85 -- --  CKTOTAL -- 601* -- -- 973* 982* --  CKMB -- 26.9* -- -- 90.0* 105.9* --  TROPONINI -- 7.31* -- -- 17.35* 18.83* --    Estimated Creatinine Clearance: 45.8 ml/min (by C-G formula based on Cr of 0.89).   Medical History: Past Medical History  Diagnosis Date  . Hypertension   . CAD (coronary artery disease)   . Osteoarthritis   . DDD (degenerative disc disease)   . Spinal stenosis   . Heart murmur   . Shortness of breath   . Myocardial infarct     Medications:  Prescriptions prior to admission  Medication Sig Dispense Refill  . aspirin EC 325 MG tablet Take 325 mg by mouth daily.        . beta carotene w/minerals (OCUVITE) tablet Take 1 tablet by mouth daily.        . diclofenac (VOLTAREN) 75 MG EC tablet Take 75 mg by mouth 2 (two) times daily.        Marland Kitchen ezetimibe-simvastatin (VYTORIN) 10-40 MG per tablet Take 1 tablet by mouth at bedtime.        . gabapentin (NEURONTIN) 300 MG capsule Take 300 mg by mouth 3 (three) times daily.        . isosorbide mononitrate (IMDUR) 60 MG 24 hr tablet Take 60 mg by mouth daily.        Marland Kitchen loratadine (CLARITIN) 10 MG tablet Take 10 mg by mouth daily.         . metoprolol (TOPROL-XL) 50 MG 24 hr tablet Take 50 mg by mouth daily.        . Multiple Vitamins-Minerals (MULTIVITAMINS THER. W/MINERALS) TABS Take 1 tablet by mouth daily.        . nitroGLYCERIN (NITROSTAT) 0.4 MG SL tablet Place 0.4 mg under the tongue every 5 (five) minutes as needed. For chest pain.       Marland Kitchen omeprazole (PRILOSEC) 20 MG capsule Take 20 mg by mouth daily.        . potassium citrate (UROCIT-K) 10 MEQ (1080 MG) SR tablet Take 10 mEq by mouth 2 (two) times daily.        . vitamin C (ASCORBIC ACID) 500 MG tablet Take 500 mg by mouth daily.          Assessment: 78 yo male started on heparin for chest pain. Heparin level (0.58) is at-goal on 1050 units/hr.   Goal of Therapy:  Heparin level 0.3-0.7 units/ml Monitor platelets by anticoagulation protocol: Yes   Plan:  1. Continue  IV heparin at 1050 units/hr   2. Daily HL, CBC  Thank you,  Brett Fairy, PharmD Pager: [phone]  11/07/2011 1:03 PM

## 2011-11-07 NOTE — Progress Notes (Signed)
Shaun [name]  78 y.o.  male  Subjective: Appears slightly dyspneic, but denies chest discomfort or shortness of breath since yesterday. He has not yet gotten up to a chair. He is mentally alert and appropriate.  Reports mild anorexia, but this has been present for months. He notes a recent weight loss.  Review of available records shows a weight of 175 pounds 5 months ago and the current weight of 167 pounds.  Allergy: Cocaine  Objective: Vital signs in last 24 hours: Temp:  [97.7 F (36.5 C)-99.2 F (37.3 C)] 98.9 F (37.2 C) (05/19 1058) Pulse Rate:  [90-93] 93  (05/19 1058) BP: (98-122)/(48-70) 122/70 mmHg (05/19 1058) SpO2:  [93 %-96 %] 94 % (05/19 1058)  75.8 kg (167 lb 1.7 oz) Body mass index is 26.97 kg/(m^2).  Weight change:  Last BM Date: 11/05/11  Intake/Output from previous day: 05/18 0701 - 05/19 0700 In: 1048.8 [P.O.:480; I.V.:564.8; IV Piggyback:4] Out: 2050 [Urine:2050]  Total I&O since admission: -1.3 L  General- Well developed; no acute distress; tachypneic Neck- No JVD Lungs- clear lung fields; normal I:E ratio Cardiovascular- normal PMI; normal S1, absent aortic component of the second heart sound, grade 3/6 wheezing mid-peaking systolic murmur at the cardiac base Abdomen- normal bowel sounds; soft and non-tender without masses or organomegaly Skin- Warm, no significant lesions Extremities- Nl distal pulses; trace presacral edema  Lab Results: Cardiac Markers:   Basename 11/07/11 0500 11/06/11 0530  TROPONINI 7.31* 17.35*   CBC:   Basename 11/06/11 0530 11/05/11 1847  WBC 15.8* 12.5*  HGB 13.6 13.9  HCT 40.2 40.5  PLT 179 173   BMET:  Basename 11/07/11 0630 11/06/11 0530  NA 136 139  K 3.3* 4.1  CL 99 106  CO2 24 23  GLUCOSE 120* 137*  BUN 16 17  CREATININE 0.89 0.85  CALCIUM 10.1 10.3   Lipids:   Basename 11/06/11 0530  CHOL 160  TRIG 111  HDL 49   Imaging Studies/Results: No results found.  Imaging: Imaging results have been  reviewed.  No chest x-rays available-one will be obtained.  Medications:  I have reviewed the patient's current medications. Scheduled:   . aspirin EC  81 mg Oral [name]  . atorvastatin  40 mg Oral q1800  . metoprolol succinate  50 mg Oral [name]  . DISCONTD: simvastatin  40 mg Oral q1800   Infusions:     . sodium chloride 10 mL/hr at 11/05/11 2200  . heparin 1,050 Units/hr (11/06/11 1540)  . nitroGLYCERIN 10 mcg/min (11/06/11 1047)    Assessment/Plan: Acute inferior myocardial infarction:  Doing well with no apparent hemodynamic compromise and no arrhythmias. Cardiac markers are now trending downward and suggest a moderate-sized infarction.   Chest x-ray and BNP level will be obtained to exclude congestive heart failure. With tachycardia and preserved left ventricular systolic function, and beta blocker will be increased. Echocardiogram will be obtained to assess left ventricular function and severity of aortic stenosis.  EKG shows inferior T-wave inversion with a Q wave limited to lead III.  Hypertension:  Blood pressure is well-controlled with current medication.  Hyperlipidemia:  Lipid profile is good but not excellent. Atorvastatin will be substituted for simvastatin at moderate dose. A lipid profile can be obtained as an outpatient and 1-2 months.  Aortic stenosis:  Examination is consistent with moderate severity. Repeat echocardiogram will allow reassessment.    LOS: 2 days   [place] Bing 11/07/2011, 3:11 PM

## 2011-11-08 LAB — CBC
HCT: 37.6 % — ABNORMAL LOW (ref 39.0–52.0)
Hemoglobin: 12.9 g/dL — ABNORMAL LOW (ref 13.0–17.0)
MCH: 32.4 pg (ref 26.0–34.0)
MCHC: 34.3 g/dL (ref 30.0–36.0)
MCV: 94.5 fL (ref 78.0–100.0)

## 2011-11-08 LAB — HEPARIN LEVEL (UNFRACTIONATED): Heparin Unfractionated: 0.35 IU/mL (ref 0.30–0.70)

## 2011-11-08 LAB — BASIC METABOLIC PANEL
BUN: 17 mg/dL (ref 6–23)
Calcium: 10 mg/dL (ref 8.4–10.5)
Creatinine, Ser: 0.98 mg/dL (ref 0.50–1.35)
GFR calc non Af Amer: 68 mL/min — ABNORMAL LOW (ref >90)
Glucose, Bld: 181 mg/dL — ABNORMAL HIGH (ref 70–99)
Sodium: 136 mEq/L (ref 135–145)

## 2011-11-08 MED ORDER — SODIUM CHLORIDE 0.9 % IV BOLUS (SEPSIS)
250.0000 mL | Freq: Once | INTRAVENOUS | Status: AC
  Administered 2011-11-08: 250 mL via INTRAVENOUS

## 2011-11-08 MED ORDER — HEPARIN BOLUS VIA INFUSION
3000.0000 [IU] | Freq: Once | INTRAVENOUS | Status: AC
  Administered 2011-11-08: 3000 [IU] via INTRAVENOUS
  Filled 2011-11-08: qty 3000

## 2011-11-08 MED ORDER — SODIUM CHLORIDE 0.9 % IV SOLN
INTRAVENOUS | Status: DC
  Administered 2011-11-09: 20 mL/h via INTRAVENOUS
  Administered 2011-11-12: 09:00:00 via INTRAVENOUS

## 2011-11-08 MED ORDER — CLOPIDOGREL BISULFATE 300 MG PO TABS
300.0000 mg | ORAL_TABLET | Freq: Once | ORAL | Status: AC
  Administered 2011-11-08: 300 mg via ORAL
  Filled 2011-11-08: qty 1

## 2011-11-08 MED ORDER — FUROSEMIDE 10 MG/ML IJ SOLN
20.0000 mg | Freq: Once | INTRAMUSCULAR | Status: AC
  Administered 2011-11-08: 20 mg via INTRAVENOUS
  Filled 2011-11-08: qty 2

## 2011-11-08 MED ORDER — MAGNESIUM HYDROXIDE 400 MG/5ML PO SUSP
15.0000 mL | Freq: Every day | ORAL | Status: DC | PRN
  Administered 2011-11-09 – 2011-11-14 (×3): 15 mL via ORAL
  Filled 2011-11-08 (×3): qty 30

## 2011-11-08 MED ORDER — CLOPIDOGREL BISULFATE 75 MG PO TABS
75.0000 mg | ORAL_TABLET | Freq: Every day | ORAL | Status: DC
  Administered 2011-11-09 – 2011-11-17 (×9): 75 mg via ORAL
  Filled 2011-11-08 (×12): qty 1

## 2011-11-08 MED ORDER — NITROGLYCERIN 2 % TD OINT
1.0000 [in_us] | TOPICAL_OINTMENT | Freq: Once | TRANSDERMAL | Status: AC
  Administered 2011-11-08: 1 [in_us] via TOPICAL
  Filled 2011-11-08: qty 30

## 2011-11-08 MED ORDER — ALUM & MAG HYDROXIDE-SIMETH 200-200-20 MG/5ML PO SUSP
15.0000 mL | ORAL | Status: DC | PRN
  Administered 2011-11-08: 30 mL via ORAL
  Filled 2011-11-08: qty 30

## 2011-11-08 MED ORDER — ISOSORBIDE MONONITRATE ER 30 MG PO TB24
30.0000 mg | ORAL_TABLET | Freq: Every day | ORAL | Status: DC
  Administered 2011-11-08 – 2011-11-17 (×10): 30 mg via ORAL
  Filled 2011-11-08 (×10): qty 1

## 2011-11-08 MED ORDER — HEPARIN (PORCINE) IN NACL 100-0.45 UNIT/ML-% IJ SOLN
1100.0000 [IU]/h | INTRAMUSCULAR | Status: DC
  Administered 2011-11-08: 1000 [IU]/h via INTRAVENOUS
  Administered 2011-11-09: 1050 [IU]/h via INTRAVENOUS
  Administered 2011-11-10: 1100 [IU]/h via INTRAVENOUS
  Filled 2011-11-08 (×4): qty 250

## 2011-11-08 MED ORDER — SODIUM CHLORIDE 0.9 % IJ SOLN
3.0000 mL | Freq: Two times a day (BID) | INTRAMUSCULAR | Status: DC
  Administered 2011-11-08 – 2011-11-17 (×16): 3 mL via INTRAVENOUS

## 2011-11-08 MED ORDER — METOPROLOL TARTRATE 50 MG PO TABS
50.0000 mg | ORAL_TABLET | Freq: Two times a day (BID) | ORAL | Status: DC
  Administered 2011-11-08 – 2011-11-11 (×7): 50 mg via ORAL
  Filled 2011-11-08 (×8): qty 1

## 2011-11-08 NOTE — Care Management Note (Addendum)
    Page 1 of 1   11/16/2011     11:59:52 AM   CARE MANAGEMENT NOTE 11/16/2011  Patient:  Shaun [name], Shaun [name]   Account Number:  [id number]  Date Initiated:  11/08/2011  Documentation initiated by:  Junius Creamer  Subjective/Objective Assessment:   adm w mi     Action/Plan:   lives w wife   Anticipated DC Date:     Anticipated DC Plan:  HOME W HOME HEALTH SERVICES  In-house referral  Clinical Social Worker      DC Planning Services  CM consult      Choice offered to / List presented to:             Status of service:   Medicare Important Message given?   (If response is "NO", the following Medicare IM given date fields will be blank) Date Medicare IM given:   Date Additional Medicare IM given:    Discharge Disposition:    Per UR Regulation:  Reviewed for med. necessity/level of care/duration of stay  If discussed at Long Length of Stay Meetings, dates discussed:    Comments:  5/28 11:57 debbie Shaquoia Miers rn,bsn spoke w pt,wife and da laura brannock by phone [phone]. 2 other da's in room also. they have hhc list, for cir consult but later note states doing too well for cir and wife req snf for short term rehab. sw ref made. will cont to follow.  5/23 11:53a debbie dowellrn,bsn spoke w pt and wife. left hhc agency list. they have met w [facility]. phy ther rec hhpt.will cont to assist as pt progresses.  5/20 13:21p debbie Remmy Crass rn,bsn [phone]

## 2011-11-08 NOTE — Progress Notes (Deleted)
Pt discharged home with wife. Pt on home Milrinone infusion. RN reviewed discharge medication and instructions; pt verbalizes understanding.

## 2011-11-08 NOTE — Progress Notes (Addendum)
ANTICOAGULATION CONSULT NOTE - Follow Up  Pharmacy Consult for heparin Indication: chest pain/ACS  Allergies  Allergen Reactions  . Cocaine     Patient Measurements: Height: 5\' 6"  (167.6 cm) Weight: 167 lb 1.7 oz (75.8 kg) IBW/kg (Calculated) : 63.8  Heparin Dosing Weight: 76 kg  Vital Signs: Temp: 97.6 F (36.4 C) (05/20 0735) Temp src: Oral (05/20 0735) BP: 112/82 mmHg (05/20 0348)  Labs:  Basename 11/08/11 0500 11/07/11 0630 11/07/11 0500 11/06/11 2130 11/06/11 1123 11/06/11 0530 11/05/11 2341 11/05/11 1847  HGB 12.9* -- -- -- -- 13.6 -- --  HCT 37.6* -- -- -- -- 40.2 -- 40.5  PLT 183 -- -- -- -- 179 -- 173  APTT -- -- -- -- -- -- -- 28  LABPROT -- -- -- -- -- -- -- 13.2  INR -- -- -- -- -- -- -- 0.98  HEPARINUNFRC -- 0.56 -- 0.62 0.80* -- -- --  CREATININE -- 0.89 -- -- -- 0.85 -- --  CKTOTAL -- -- 601* -- -- 973* 982* --  CKMB -- -- 26.9* -- -- 90.0* 105.9* --  TROPONINI -- -- 7.31* -- -- 17.35* 18.83* --    Estimated Creatinine Clearance: 45.8 ml/min (by C-G formula based on Cr of 0.89).   Medical History: Past Medical History  Diagnosis Date  . Hypertension   . CAD (coronary artery disease)   . Osteoarthritis   . DDD (degenerative disc disease)   . Spinal stenosis   . Heart murmur   . Shortness of breath   . Myocardial infarct     Medications:  Prescriptions prior to admission  Medication Sig Dispense Refill  . aspirin EC 325 MG tablet Take 325 mg by mouth daily.        . beta carotene w/minerals (OCUVITE) tablet Take 1 tablet by mouth daily.        . diclofenac (VOLTAREN) 75 MG EC tablet Take 75 mg by mouth 2 (two) times daily.        Marland Kitchen ezetimibe-simvastatin (VYTORIN) 10-40 MG per tablet Take 1 tablet by mouth at bedtime.        . gabapentin (NEURONTIN) 300 MG capsule Take 300 mg by mouth 3 (three) times daily.        . isosorbide mononitrate (IMDUR) 60 MG 24 hr tablet Take 60 mg by mouth daily.        Marland Kitchen loratadine (CLARITIN) 10 MG tablet Take 10  mg by mouth daily.        . metoprolol (TOPROL-XL) 50 MG 24 hr tablet Take 50 mg by mouth daily.        . Multiple Vitamins-Minerals (MULTIVITAMINS THER. W/MINERALS) TABS Take 1 tablet by mouth daily.        . nitroGLYCERIN (NITROSTAT) 0.4 MG SL tablet Place 0.4 mg under the tongue every 5 (five) minutes as needed. For chest pain.       Marland Kitchen omeprazole (PRILOSEC) 20 MG capsule Take 20 mg by mouth daily.        . potassium citrate (UROCIT-K) 10 MEQ (1080 MG) SR tablet Take 10 mEq by mouth 2 (two) times daily.        . vitamin C (ASCORBIC ACID) 500 MG tablet Take 500 mg by mouth daily.          Assessment: 78 yo male started on heparin for NSTEMI over the weekend.  Subsequently heparin d/c 5/19 - no ongoing CP or arrhythmias and enzymes trending down  Pan was for medical management.  This morning new CP and new ECG changes so restart heparin.    Goal of Therapy:  Heparin level 0.3-0.7 units/ml Monitor platelets by anticoagulation protocol: Yes   Plan:  1.  IV heparin at 1000 units/hr   2. heparin bolus 3000 uts iv x1 2. Daily HL, CBC   Leota Sauers Pharm.D. CPP, BCPS Clinical Pharmacist [phone] 11/08/2011 8:38 AM

## 2011-11-08 NOTE — Progress Notes (Signed)
SUBJECTIVE:  Mild intermittent chest discomfort.  Some shortness of breath.  Felt worse after walking to the bathroom the this morning and getting back.  Problems started while sitting and shaving.  Hedid not "feel right."  No pain like he had in his chest on Thursday.  Some intermittent confusion.  Poor appetite.   Wife is very upset about his confusion.  OBJECTIVE:   Vitals:   Filed Vitals:   11/08/11 0348 11/08/11 0735 11/08/11 0800 11/08/11 0900  BP: 112/82  103/51 110/78  Pulse:   57 110  Temp: 98.5 F (36.9 C) 97.6 F (36.4 C)    TempSrc: Oral Oral    Resp: 22  27 17   Height:      Weight:      SpO2: 94% 93% 97% 98%   I&O's:    Intake/Output Summary (Last 24 hours) at 11/08/11 1610 Last data filed at 11/08/11 0400  Gross per 24 hour  Intake 601.38 ml  Output    500 ml  Net 101.38 ml   TELEMETRY: Reviewed telemetry pt in NS, intermittent atrial flutter:     PHYSICAL EXAM General: Well developed, well nourished, in no acute distress Head:    Normal cephalic and atramatic  Lungs:  Bibasilar crackles Heart:   tachycardic S1 S2, 2/6 SEM Abdomen:  abdomen soft and non-tender without masses or                  Hernia's noted. Msk:  Back normal, normal gait. Normal strength and tone for age. Extremities:   1+ edema Neuro: Alert and oriented X 3. Psych: Flat affect, responds appropriately   LABS: Basic Metabolic Panel:  Basename 11/07/11 0630 11/06/11 0530  NA 136 139  K 3.3* 4.1  CL 99 106  CO2 24 23  GLUCOSE 120* 137*  BUN 16 17  CREATININE 0.89 0.85  CALCIUM 10.1 10.3  MG -- --  PHOS -- --   Liver Function Tests: No results found for this basename: AST:2,ALT:2,ALKPHOS:2,BILITOT:2,PROT:2,ALBUMIN:2 in the last 72 hours No results found for this basename: LIPASE:2,AMYLASE:2 in the last 72 hours CBC:  Basename 11/08/11 0500 11/06/11 0530  WBC 15.4* 15.8*  NEUTROABS -- --  HGB 12.9* 13.6  HCT 37.6* 40.2  MCV 94.5 93.7  PLT 183 179   Cardiac  Enzymes:  Basename 11/07/11 0500 11/06/11 0530 11/05/11 2341  CKTOTAL 601* 973* 982*  CKMB 26.9* 90.0* 105.9*  CKMBINDEX -- -- --  TROPONINI 7.31* 17.35* 18.83*   BNP: No components found with this basename: POCBNP:3 D-Dimer: No results found for this basename: DDIMER:2 in the last 72 hours Hemoglobin A1C:  Basename 11/05/11 1847  HGBA1C 5.9*   Fasting Lipid Panel:  Basename 11/06/11 0530  CHOL 160  HDL 49  LDLCALC 89  TRIG 111  CHOLHDL 3.3  LDLDIRECT --   Thyroid Function Tests: No results found for this basename: TSH,T4TOTAL,FREET3,T3FREE,THYROIDAB in the last 72 hours Anemia Panel: No results found for this basename: VITAMINB12,FOLATE,FERRITIN,TIBC,IRON,RETICCTPCT in the last 72 hours Coag Panel:   Lab Results  Component Value Date   INR 0.98 11/05/2011   INR 1.12 06/12/2010    RADIOLOGY: No results found.    ASSESSMENT: Late presenting acute inferior MI.  Medical therapy.  PLAN:  1) Slightly more pronounced ST elevation in lead 3 today.  Given age, and comorbidites, we are still continuing with conservative therapy at this time.  Enzymes were slowly trending down.  Most severe pain was last Thursday night. I do not think  that benefits outweigh the risks of cardiac cath at this time.  Continue beta blocker, aspirin, statin.  Titrate NTG for chest pain.    2) Some SHOB. Now with slow atrial flutter.  HR around 110. Increase beta blocker.  3) Also discussed code status at length with family present.  He wishes to be DNR/DNI and filled out a living will in the past.    4) Benadryl at night at home dose for sleep.  Corky Crafts., MD  11/08/2011  9:42 AM   Addendum.  Echo reviewed.  LV function 40-45%.  Will give some IV lasix for fluid overload seen on echo.  Consider palliative care consult.

## 2011-11-08 NOTE — Progress Notes (Signed)
  Echocardiogram 2D Echocardiogram has been performed.  Shaun [name] [facility] 11/08/2011, 9:53 AM

## 2011-11-08 NOTE — Progress Notes (Signed)
ANTICOAGULATION CONSULT NOTE - Follow Up  Pharmacy Consult for heparin Indication: chest pain/ACS  Allergies  Allergen Reactions  . Cocaine     Patient Measurements: Height: 5\' 6"  (167.6 cm) Weight: 167 lb 1.7 oz (75.8 kg) IBW/kg (Calculated) : 63.8  Heparin Dosing Weight: 76 kg  Vital Signs: Temp: 98.3 F (36.8 C) (05/20 1600) Temp src: Oral (05/20 1600) BP: 98/79 mmHg (05/20 1500) Pulse Rate: 59  (05/20 1500)  Labs:  Basename 11/08/11 1550 11/08/11 0848 11/08/11 0500 11/07/11 0630 11/07/11 0500 11/06/11 2130 11/06/11 0530 11/05/11 1847  HGB -- -- 12.9* -- -- -- 13.6 --  HCT -- -- 37.6* -- -- -- 40.2 40.5  PLT -- -- 183 -- -- -- 179 173  APTT -- -- -- -- -- -- -- 28  LABPROT -- -- -- -- -- -- -- 13.2  INR -- -- -- -- -- -- -- 0.98  HEPARINUNFRC 0.35 -- -- 0.56 -- 0.62 -- --  CREATININE -- 0.98 -- 0.89 -- -- 0.85 --  CKTOTAL -- 320* -- -- 601* -- 973* --  CKMB -- 17.7* -- -- 26.9* -- 90.0* --  TROPONINI -- 10.35* -- -- 7.31* -- 17.35* --    Estimated Creatinine Clearance: 41.6 ml/min (by C-G formula based on Cr of 0.98).   Medical History: Past Medical History  Diagnosis Date  . Hypertension   . CAD (coronary artery disease)   . Osteoarthritis   . DDD (degenerative disc disease)   . Spinal stenosis   . Heart murmur   . Shortness of breath   . Myocardial infarct     Medications:  Prescriptions prior to admission  Medication Sig Dispense Refill  . aspirin EC 325 MG tablet Take 325 mg by mouth daily.        . beta carotene w/minerals (OCUVITE) tablet Take 1 tablet by mouth daily.        . diclofenac (VOLTAREN) 75 MG EC tablet Take 75 mg by mouth 2 (two) times daily.        Marland Kitchen ezetimibe-simvastatin (VYTORIN) 10-40 MG per tablet Take 1 tablet by mouth at bedtime.        . gabapentin (NEURONTIN) 300 MG capsule Take 300 mg by mouth 3 (three) times daily.        . isosorbide mononitrate (IMDUR) 60 MG 24 hr tablet Take 60 mg by mouth daily.        Marland Kitchen loratadine  (CLARITIN) 10 MG tablet Take 10 mg by mouth daily.        . metoprolol (TOPROL-XL) 50 MG 24 hr tablet Take 50 mg by mouth daily.        . Multiple Vitamins-Minerals (MULTIVITAMINS THER. W/MINERALS) TABS Take 1 tablet by mouth daily.        . nitroGLYCERIN (NITROSTAT) 0.4 MG SL tablet Place 0.4 mg under the tongue every 5 (five) minutes as needed. For chest pain.       Marland Kitchen omeprazole (PRILOSEC) 20 MG capsule Take 20 mg by mouth daily.        . potassium citrate (UROCIT-K) 10 MEQ (1080 MG) SR tablet Take 10 mEq by mouth 2 (two) times daily.        . vitamin C (ASCORBIC ACID) 500 MG tablet Take 500 mg by mouth daily.          Assessment: NSTEMI:  Heparin level is therapeutic but at the lower end of the therapeutic range.  He was therapeutic on 1050 units/hr on 5/18 and 5/19.  Goal of Therapy:  Heparin level 0.3-0.7 units/ml Monitor platelets by anticoagulation protocol: Yes   Plan:  Increase Heparin slightly to 1050 units/hr Check AM heparin level and CBC.  Estella Husk, Pharm.D., BCPS Clinical Pharmacist  Phone [phone] Pager [phone] 11/08/2011, 4:42 PM

## 2011-11-08 NOTE — Progress Notes (Signed)
Pt complained of chest pain @1530 . Pt rated pain a 5 out of 10 and described pain as a tightness in his chest and he felt SOB. RN administered SL Nitro @1532 . Reassessed pain @ 1535; pt stated chest pain as a 4 out of 10 and was gradually improving. RN administer a second SL Nitro @ 1540. Pt then stated his chest pain was a 4 out of 5 and did not feel improvement. Pt HR and blood pressure remained stable. RN notified Dr. Anne Fu of pt status change; MD ordered Imdur, Nitro paste, and Maalox. RN will continue to monitor pt closely.

## 2011-11-08 NOTE — Progress Notes (Signed)
RN assessed pt and pt reported feeling 'weak' and 'not right'.  Pt pale, diaphoretic and lethargic per wife.  Pt denies any chest pain, but does report shortness of breath.  Pt BP 85 systolic.  02 sats 93% on RA.  RN placed pt on 2L [place] 02.  RN started IVF at [facility] and called for 12 lead EKG.  Pt HR irregular.  On 12 lead, EKG revealed acute MI (STEMI) with elevation in II, III, AVF.  Also a 2nd degree HB. RN notified Dr. Eldridge Dace about pts condition and EKG.  MD ordered to restart IV heparin, 250cc NS bolus and cardiac enzymes.  MD will come see pt soon.  RN will continue to monitor pt closely.   Holley Raring Hilda Lias

## 2011-11-09 LAB — BASIC METABOLIC PANEL
BUN: 31 mg/dL — ABNORMAL HIGH (ref 6–23)
CO2: 22 mEq/L (ref 19–32)
Calcium: 10.2 mg/dL (ref 8.4–10.5)
Chloride: 100 mEq/L (ref 96–112)
Creatinine, Ser: 1.23 mg/dL (ref 0.50–1.35)
GFR calc Af Amer: 56 mL/min — ABNORMAL LOW (ref >90)
GFR calc non Af Amer: 48 mL/min — ABNORMAL LOW (ref >90)
Sodium: 137 mEq/L (ref 135–145)

## 2011-11-09 LAB — CBC
HCT: 36.1 % — ABNORMAL LOW (ref 39.0–52.0)
Hemoglobin: 12.1 g/dL — ABNORMAL LOW (ref 13.0–17.0)
RBC: 3.81 MIL/uL — ABNORMAL LOW (ref 4.22–5.81)

## 2011-11-09 LAB — HEPARIN LEVEL (UNFRACTIONATED): Heparin Unfractionated: 0.28 IU/mL — ABNORMAL LOW (ref 0.30–0.70)

## 2011-11-09 MED ORDER — POTASSIUM CHLORIDE CRYS ER 20 MEQ PO TBCR
40.0000 meq | EXTENDED_RELEASE_TABLET | Freq: Once | ORAL | Status: AC
  Administered 2011-11-09: 40 meq via ORAL
  Filled 2011-11-09: qty 2

## 2011-11-09 MED ORDER — ZOLPIDEM TARTRATE 5 MG PO TABS: 5.0000 mg | ORAL_TABLET | Freq: Every evening | ORAL | Status: DC | PRN

## 2011-11-09 MED ORDER — FUROSEMIDE 10 MG/ML IJ SOLN
20.0000 mg | Freq: Once | INTRAMUSCULAR | Status: AC
  Administered 2011-11-09: 20 mg via INTRAVENOUS
  Filled 2011-11-09: qty 2

## 2011-11-09 NOTE — Progress Notes (Addendum)
Follow up - call received from patient's stepdaughter -she has spoken with her mom, patient's wife, and request GOC meeting time be changed to 11:00 am tomorrow Wednesday 11/10/11.  Time confirmed - Valente David, RN    Palliative Medicine Team consult for goals of care; related symptom management requested by Dr Leontine Locket; spoke with patient, granddaughter Clydie Braun, step daughter Fulton Mole and niece Angie at bedside- family stated patient's wife had gone home to rest and will be spending the night with the patient tonight- they are requesting an early meeting time tomorrow as wife will already be at hospital and they feel patient's daughter will be able to be at hospital at this time- meeting scheduled for tomorrow, Wednesday 11/10/11 @ 8:00 am - family has PMT phone and will call if this time needs to be changed.   Valente David, RN 11/09/2011, 2:04 PM Palliative Medicine Team RN Liaison [phone]

## 2011-11-09 NOTE — Progress Notes (Signed)
ANTICOAGULATION CONSULT NOTE - Follow Up  Pharmacy Consult for heparin Indication: chest pain/ACS  Allergies  Allergen Reactions  . Cocaine     Patient Measurements: Height: 5\' 6"  (167.6 cm) Weight: 167 lb 1.7 oz (75.8 kg) IBW/kg (Calculated) : 63.8  Heparin Dosing Weight: 76 kg  Vital Signs: Temp: 98.3 F (36.8 C) (05/21 0741) Temp src: Oral (05/21 0741) BP: 130/64 mmHg (05/21 0741) Pulse Rate: 76  (05/21 0741)  Labs:  Basename 11/09/11 0530 11/08/11 1550 11/08/11 0848 11/08/11 0500 11/07/11 0630 11/07/11 0500  HGB 12.1* -- -- 12.9* -- --  HCT 36.1* -- -- 37.6* -- --  PLT 206 -- -- 183 -- --  APTT -- -- -- -- -- --  LABPROT -- -- -- -- -- --  INR -- -- -- -- -- --  HEPARINUNFRC 0.28* 0.35 -- -- 0.56 --  CREATININE 1.23 -- 0.98 -- 0.89 --  CKTOTAL 320* -- 320* -- -- 601*  CKMB 20.6* -- 17.7* -- -- 26.9*  TROPONINI 9.25* -- 10.35* -- -- 7.31*    Estimated Creatinine Clearance: 33.1 ml/min (by C-G formula based on Cr of 1.23).   Medical History: Past Medical History  Diagnosis Date  . Hypertension   . CAD (coronary artery disease)   . Osteoarthritis   . DDD (degenerative disc disease)   . Spinal stenosis   . Heart murmur   . Shortness of breath   . Myocardial infarct     Medications:  Prescriptions prior to admission  Medication Sig Dispense Refill  . aspirin EC 325 MG tablet Take 325 mg by mouth daily.        . beta carotene w/minerals (OCUVITE) tablet Take 1 tablet by mouth daily.        . diclofenac (VOLTAREN) 75 MG EC tablet Take 75 mg by mouth 2 (two) times daily.        Marland Kitchen ezetimibe-simvastatin (VYTORIN) 10-40 MG per tablet Take 1 tablet by mouth at bedtime.        . gabapentin (NEURONTIN) 300 MG capsule Take 300 mg by mouth 3 (three) times daily.        . isosorbide mononitrate (IMDUR) 60 MG 24 hr tablet Take 60 mg by mouth daily.        Marland Kitchen loratadine (CLARITIN) 10 MG tablet Take 10 mg by mouth daily.        . metoprolol (TOPROL-XL) 50 MG 24 hr  tablet Take 50 mg by mouth daily.        . Multiple Vitamins-Minerals (MULTIVITAMINS THER. W/MINERALS) TABS Take 1 tablet by mouth daily.        . nitroGLYCERIN (NITROSTAT) 0.4 MG SL tablet Place 0.4 mg under the tongue every 5 (five) minutes as needed. For chest pain.       Marland Kitchen omeprazole (PRILOSEC) 20 MG capsule Take 20 mg by mouth daily.        . potassium citrate (UROCIT-K) 10 MEQ (1080 MG) SR tablet Take 10 mEq by mouth 2 (two) times daily.        . vitamin C (ASCORBIC ACID) 500 MG tablet Take 500 mg by mouth daily.          Assessment: NSTEMI plan for conservative treatment and optimize meds.    Heparin level 0.28 < goal.  No bleeding  noted, CBC stable, vss. Goal of Therapy:  Heparin level 0.3-0.7 units/ml Monitor platelets by anticoagulation protocol: Yes   Plan:  Increase Heparin slightly to 1100 units/hr Check AM heparin level  and CBC.  Leota Sauers Pharm.D. CPP, BCPS Clinical Pharmacist [phone] 11/09/2011 10:38 AM

## 2011-11-09 NOTE — Progress Notes (Signed)
SUBJECTIVE:   chest discomfort improved.  Some shortness of breath.  Has not walked since yesterday morning.   No pain like he had in his chest on Thursday.  Some intermittent confusion.  Ate oatmeal today. Likes regular diet better.     OBJECTIVE:   Vitals:   Filed Vitals:   11/09/11 0432 11/09/11 0500 11/09/11 0600 11/09/11 0741  BP: 115/56 111/53 130/63 130/64  Pulse: 73 60 86 76  Temp: 98.1 F (36.7 C)   98.3 F (36.8 C)  TempSrc: Oral   Oral  Resp: 21 21 30 17   Height:      Weight:      SpO2:  95% 93% 95%   I&O's:    Intake/Output Summary (Last 24 hours) at 11/09/11 0854 Last data filed at 11/09/11 0700  Gross per 24 hour  Intake 1084.44 ml  Output    305 ml  Net 779.44 ml   TELEMETRY: Reviewed telemetry pt in NS, intermittent atrial flutter, pause > 3 seconds, presumably during sleep at 4AM on 5/21:     PHYSICAL EXAM General: Well developed, well nourished, in no acute distress Head:    Normal cephalic and atramatic  Lungs:  Bibasilar crackles Heart:   RRR S1 S2, 2/6 SEM Abdomen:  abdomen soft and non-tender without masses or                  Hernia's noted. Msk:  Back normal, normal gait. Normal strength and tone for age. Extremities:   tr edema Neuro: Alert and oriented X 3. Psych: Flat affect, responds appropriately   LABS: Basic Metabolic Panel:  Basename 11/09/11 0530 11/08/11 0848  NA 137 136  K 3.6 3.5  CL 100 98  CO2 22 21  GLUCOSE 149* 181*  BUN 31* 17  CREATININE 1.23 0.98  CALCIUM 10.2 10.0  MG -- --  PHOS -- --   Liver Function Tests: No results found for this basename: AST:2,ALT:2,ALKPHOS:2,BILITOT:2,PROT:2,ALBUMIN:2 in the last 72 hours No results found for this basename: LIPASE:2,AMYLASE:2 in the last 72 hours CBC:  Basename 11/09/11 0530 11/08/11 0500  WBC 17.7* 15.4*  NEUTROABS -- --  HGB 12.1* 12.9*  HCT 36.1* 37.6*  MCV 94.8 94.5  PLT 206 183   Cardiac Enzymes:  Basename 11/09/11 0530 11/08/11 0848 11/07/11 0500    CKTOTAL 320* 320* 601*  CKMB 20.6* 17.7* 26.9*  CKMBINDEX -- -- --  TROPONINI 9.25* 10.35* 7.31*   BNP: No components found with this basename: POCBNP:3 D-Dimer: No results found for this basename: DDIMER:2 in the last 72 hours Hemoglobin A1C: No results found for this basename: HGBA1C in the last 72 hours Fasting Lipid Panel: No results found for this basename: CHOL,HDL,LDLCALC,TRIG,CHOLHDL,LDLDIRECT in the last 72 hours Thyroid Function Tests: No results found for this basename: TSH,T4TOTAL,FREET3,T3FREE,THYROIDAB in the last 72 hours Anemia Panel: No results found for this basename: VITAMINB12,FOLATE,FERRITIN,TIBC,IRON,RETICCTPCT in the last 72 hours Coag Panel:   Lab Results  Component Value Date   INR 0.98 11/05/2011   INR 1.12 06/12/2010    RADIOLOGY: No results found.    ASSESSMENT: Late presenting acute inferior MI.  Medical therapy.  PLAN:  1) Given age, and comorbidites, we are still continuing with conservative therapy at this time.  Enzymes were slowly trending down.  Most severe pain was last Thursday night. I do not think that benefits outweigh the risks of cardiac cath at this time.  Continue beta blocker, aspirin, statin.  Titrate NTG for chest pain.  Can increase  Imdur.  2) Some SHOB. atrial flutter resolved on Increased beta blocker.  Try Lasix again today. Watch Cr.  BUN slightly increased. Replace K.  3)   He wishes to be DNR/DNI and filled out a living will in the past.  Dr. Ladona Ridgel consulted from palliative care.  Will also get PT to work with patient today.  Encouraged PO intake.  Family is very supportive of keeping him comfortable here and at home.  Marland Kitchen  Corky Crafts., MD  11/09/2011  8:54 AM

## 2011-11-10 ENCOUNTER — Inpatient Hospital Stay (HOSPITAL_COMMUNITY): Payer: MEDICARE

## 2011-11-10 DIAGNOSIS — M79609 Pain in unspecified limb: Secondary | ICD-10-CM

## 2011-11-10 DIAGNOSIS — R0602 Shortness of breath: Secondary | ICD-10-CM

## 2011-11-10 LAB — URINE MICROSCOPIC-ADD ON

## 2011-11-10 LAB — URINALYSIS, ROUTINE W REFLEX MICROSCOPIC
Glucose, UA: NEGATIVE mg/dL
Ketones, ur: NEGATIVE mg/dL
Protein, ur: 30 mg/dL — AB
pH: 5 (ref 5.0–8.0)

## 2011-11-10 LAB — BASIC METABOLIC PANEL
CO2: 23 mEq/L (ref 19–32)
Calcium: 10.3 mg/dL (ref 8.4–10.5)
Chloride: 99 mEq/L (ref 96–112)
Creatinine, Ser: 1.06 mg/dL (ref 0.50–1.35)
Glucose, Bld: 132 mg/dL — ABNORMAL HIGH (ref 70–99)

## 2011-11-10 LAB — CBC
HCT: 35.8 % — ABNORMAL LOW (ref 39.0–52.0)
Hemoglobin: 12 g/dL — ABNORMAL LOW (ref 13.0–17.0)
MCH: 31.9 pg (ref 26.0–34.0)
MCHC: 33.5 g/dL (ref 30.0–36.0)
MCV: 95.2 fL (ref 78.0–100.0)
RBC: 3.76 MIL/uL — ABNORMAL LOW (ref 4.22–5.81)

## 2011-11-10 MED ORDER — POTASSIUM CHLORIDE CRYS ER 20 MEQ PO TBCR: 40.0000 meq | EXTENDED_RELEASE_TABLET | Freq: Once | ORAL | Status: DC

## 2011-11-10 MED ORDER — DOCUSATE SODIUM 100 MG PO CAPS
100.0000 mg | ORAL_CAPSULE | Freq: Every day | ORAL | Status: DC
  Administered 2011-11-10 – 2011-11-12 (×3): 100 mg via ORAL
  Filled 2011-11-10 (×4): qty 1

## 2011-11-10 MED ORDER — FUROSEMIDE 10 MG/ML IJ SOLN
40.0000 mg | Freq: Once | INTRAMUSCULAR | Status: AC
  Administered 2011-11-10: 40 mg via INTRAVENOUS
  Filled 2011-11-10: qty 4

## 2011-11-10 MED ORDER — POTASSIUM CHLORIDE CRYS ER 20 MEQ PO TBCR
EXTENDED_RELEASE_TABLET | ORAL | Status: AC
  Administered 2011-11-10: 10:00:00
  Filled 2011-11-10: qty 2

## 2011-11-10 MED ORDER — OXYCODONE HCL 5 MG PO TABS: 5.0000 mg | ORAL_TABLET | Freq: Four times a day (QID) | ORAL | Status: DC | PRN

## 2011-11-10 NOTE — Consult Note (Signed)
Consult Note from the Palliative Medicine Team at [facility] Patient Shaun [name]      DOB: 01/15/1915      OZH:[id number]   Consult Requested by:Dr Catalina Gravel                              PCP: No primary provider on file. Reason for Consultation: Goals of Care/Symptom Management    Phone Number:None  Reviewed chart, spoke with staff and meet with the following [place] family: patient, Shaun [name] (wife/HPOA), Shaun [name] (daughter), Shaun [name] (patient's step-daughter) and Shaun [name] (Grad-Daughter). We discussed patients status and living situation prior to recent admission. Patient living at home with spouse, leading fairly active life-style considering age of 53, cardio-vascular co-morbidities, and DJD of the right knee. Was able to ambulate short distances with slight SOB, and R knee pain with weight-bearing, appetite normal. Mental state prior to was alert/oriented, no bouts of confusion (except remote episode after taking Ambein) Patient/wife participate in local bowling league weekly. Daughters also manage to support parents on an intermittent basis as needed. Patient and family would like to see the patient return home upon discharge or return home after SNF rehab if PT evaluation recommends this based on upcoming evaluation.  We had detailed discussion pertaining to advanced directives. Levels of medical care aggresiveness regarding cardiac resuscitation, respiratory intubation, use of antibiotic therapy, re-hospitalization, artificial feeding and hydration. Patient values and goals of care were established and  Medical Orders for Scope of Treatment form was filled out and placed on chart, in addition to DNR goldenrod form.  Also inquired about patients sleeping habits, naps in afternoon (1-3 hrs) in recliner, takes Tylenol PM ( with Benadryl 25 mg) nightly at home. Was prescribed Ambien 3 yrs ago, but had an episode of confusion, ambulating in home at night, has not taken since. Had  episode of confusion on 5/18 after taking Benadryl for sleep, since he takes a form of Benadryl at home nightly confusion other night may be multi-factorial and related to intermittent delirium.   Assessment and Plan: 1. Code Status:DNR/DNI 2. Symptom Control:      -Dyspnea/Pain: recommendations for R knee pain with weight bearing, and also for dyspnea as needed Oxycodone 5 mg every         6 hrs as needed. Colace 100 mg at HS to prevent constipation with opioid use.  3. Psycho/Social: Emotional support to patient and family. Will continue to follow during admission 4. Spiritual: Devote methodist, chaplin service involved already. 5. Disposition: Patient and family requesting discharge to home or to home after SNF/Rehab depending on PT recommendations. Will wait to place CSW or Case Management consult until after PT evaluation.  Patient Documents Completed or Given: Document Given Completed  Advanced Directives Pkt    MOST     YES  DNR     YES  Gone from My Sight    Hard Choices      Brief HPI: Is an 78 yo WM that was admitted on 5/17 admitted after experiencing chest discomfort on 5/16 that relieved after 3 dosings of NTG at home. Was evaluated 5/17 at Dr Suezanne Jacquet at which time EKG showed changes consistent with inferior wall cardiac injury, f/u CK serum level elevated, at which time patient was admitted for further evaluation and treatment.   ROS: Dyspnea, generalized weakness, R knee pain when weight bearing    PMH:  Past Medical History  Diagnosis Date  .  Hypertension   . CAD (coronary artery disease)   . Osteoarthritis   . DDD (degenerative disc disease)   . Spinal stenosis   . Heart murmur   . Shortness of breath   . Myocardial infarct      PSH: Past Surgical History  Procedure Date  . Coronary artery bypass graft   . Tonsillectomy   . Vascular surgery   . Coronary angioplasty    I have reviewed the FH and SH and  If appropriate update it with new  information. Allergies  Allergen Reactions  . Cocaine    Scheduled Meds:   . aspirin EC  81 mg Oral Daily  . atorvastatin  40 mg Oral q1800  . clopidogrel  75 mg Oral Q breakfast  . furosemide  40 mg Intravenous Once  . isosorbide mononitrate  30 mg Oral Daily  . metoprolol tartrate  50 mg Oral BID  . potassium chloride SA      . potassium chloride  40 mEq Oral Once  . sodium chloride  3 mL Intravenous Q12H   Continuous Infusions:   . sodium chloride 20 mL/hr at 11/09/11 1900  . DISCONTD: heparin 1,100 Units/hr (11/10/11 0900)   PRN Meds:.acetaminophen, alum & mag hydroxide-simeth, diphenhydrAMINE, magnesium hydroxide, nitroGLYCERIN, ondansetron (ZOFRAN) IV, zolpidem    BP 118/60  Pulse 105  Temp(Src) 98.2 F (36.8 C) (Oral)  Resp 20  Ht 5\' 6"  (1.676 m)  Wt 75.8 kg (167 lb 1.7 oz)  BMI 26.97 kg/m2  SpO2 94%   PPS: 70% prior to this admission, currently 50%. PT evaluation post MI would evaluate impact on previous level of activity  Variable  0 Points 1 Point 2 Points Total  Heart rate per minute  <90 beats 90-109 beats >110 beats    1  Respiratory  Rate per minute < 18 breaths 19-30 breaths  >30 breaths     1   Restlessness; nonpurposeful movements None  occas slight movement Frequent movement     0  Paradoxical breathing pattern: None  Present     0  Accessory muscle use: rise in clavicle during inspiration None Slight rise Pronounced rise      2  Grunting at end-expiration: guttural sound None  Present      0  Nasal flaring: involuntary movement of nares None  Present      0  Look of fear None  Eyes wide      0  Overall total out of 16       4/16    Respiratory Distress Observation Scale Journal of Palliative Medicine Vol. 13, Number 3, 2010 Campbell et al. Page. 285-290   Intake/Output Summary (Last 24 hours) at 11/10/11 1224 Last data filed at 11/10/11 1200  Gross per 24 hour  Intake    639 ml  Output    900 ml  Net   -261 ml   LBM:11/09/11                      Physical Exam:  General: Elderly WM that appears younger than chronological age, slightly dyspneic, sitting comfortably in bed HEENT:  Anicteric, buccal mucosa dry Chest: fine crackles at bases, otherwise clear  CVS:RRR, no audible MGR Abdomen:soft, non-tender Ext: trace pedal edema, no mottling Neuro:alert/oriented x3  Labs: CBC    Component Value Date/Time   WBC 18.9* 11/10/2011 0539   RBC 3.76* 11/10/2011 0539   HGB 12.0* 11/10/2011 0539   HCT 35.8* 11/10/2011 0539  PLT 197 11/10/2011 0539   MCV 95.2 11/10/2011 0539   MCH 31.9 11/10/2011 0539   MCHC 33.5 11/10/2011 0539   RDW 13.4 11/10/2011 0539   LYMPHSABS 1.0 06/11/2010 0545   MONOABS 0.5 06/11/2010 0545   EOSABS 0.0 06/11/2010 0545   BASOSABS 0.0 06/11/2010 0545    BMET    Component Value Date/Time   NA 134* 11/10/2011 0539   K 4.4 11/10/2011 0539   CL 99 11/10/2011 0539   CO2 23 11/10/2011 0539   GLUCOSE 132* 11/10/2011 0539   BUN 41* 11/10/2011 0539   CREATININE 1.06 11/10/2011 0539   CALCIUM 10.3 11/10/2011 0539   GFRNONAA 58* 11/10/2011 0539   GFRAA 67* 11/10/2011 0539    CMP     Component Value Date/Time   NA 134* 11/10/2011 0539   K 4.4 11/10/2011 0539   CL 99 11/10/2011 0539   CO2 23 11/10/2011 0539   GLUCOSE 132* 11/10/2011 0539   BUN 41* 11/10/2011 0539   CREATININE 1.06 11/10/2011 0539   CALCIUM 10.3 11/10/2011 0539   PROT 5.5* 06/12/2010 0455   ALBUMIN 2.9* 06/12/2010 0455   AST 35 06/12/2010 0455   ALT 32 06/12/2010 0455   ALKPHOS 36* 06/12/2010 0455   BILITOT 0.3 06/12/2010 0455   GFRNONAA 58* 11/10/2011 0539   GFRAA 67* 11/10/2011 0539      Time In Time Out Total Time Spent with Patient Total Overall Time     11:00a   12:30p   60 min    90 min    Greater than 50%  of this time was spent counseling and coordinating care related to the above assessment and plan.   Freddie Breech, CNS-C Palliative Medicine Team [facility] Team Phone: [phone] Pager:  [phone]

## 2011-11-10 NOTE — Evaluation (Signed)
Physical Therapy Evaluation Patient Details Name: Shaun [name] MRN: [id number] DOB: 1914-11-11 Today's Date: 11/10/2011 Time: 4098-1191 PT Time Calculation (min): 28 min  PT Assessment / Plan / Recommendation Clinical Impression  Pt admitted with MI and has not been OOB since 5/17 admission. Pt limited by dyspnea and ? decr SaO2 (monitor with poor waveform for 2 minutes and unable to get a true reading). Pt otherwise moving surprisingly well and anticipate can go home with spouse on d/c. Will follow for further assessment and recommendations.    PT Assessment  Patient needs continued PT services    Follow Up Recommendations  Home health PT;Supervision - Intermittent    Barriers to Discharge None      lEquipment Recommendations  None recommended by PT    Recommendations for Other Services OT consult   Frequency Min 3X/week    Precautions / Restrictions Precautions Precautions: Other (comment) (monitor SaO2)   Pertinent Vitals/Pain SaO2 at rest on 2L 97%; on RA decr to 90% and 2L resumed prior to activity; during activity monitor not reading (poor waveform) so unclear if SaO2 dropped.  HR 96 to 107      Mobility  Bed Mobility Bed Mobility: Rolling Right;Right Sidelying to Sit;Sitting - Scoot to Edge of Bed Rolling Right: 6: Modified independent (Device/Increase time);With rail Right Sidelying to Sit: 6: Modified independent (Device/Increase time);With rails;HOB flat Sitting - Scoot to Edge of Bed: 6: Modified independent (Device/Increase time) Details for Bed Mobility Assistance: uses bil UEs to assist with all mobility Transfers Transfers: Sit to Stand;Stand to Sit;Stand Pivot Transfers Sit to Stand: 4: Min guard;With upper extremity assist;From bed Stand to Sit: 4: Min guard;Without upper extremity assist;To chair/3-in-1 Stand Pivot Transfers: 4: Min guard Details for Transfer Assistance: Pt required cues for safe use of RW/hand placement; slightly uncontrolled descent  to chair without use of arms    Exercises     PT Diagnosis: Difficulty walking  PT Problem List: Decreased activity tolerance;Decreased balance;Decreased mobility;Decreased knowledge of use of DME;Cardiopulmonary status limiting activity PT Treatment Interventions: DME instruction;Gait training;Stair training;Functional mobility training;Therapeutic activities;Balance training;Patient/family education   PT Goals Acute Rehab PT Goals PT Goal Formulation: With patient/family Time For Goal Achievement: 11/17/11 Potential to Achieve Goals: Good Pt will go Supine/Side to Sit: with modified independence;Other (comment) (incr time, no rail, HOB 0) PT Goal: Supine/Side to Sit - Progress: Goal set today Pt will go Sit to Stand: with modified independence;with upper extremity assist PT Goal: Sit to Stand - Progress: Goal set today Pt will Ambulate: 51 - 150 feet;with modified independence;with least restrictive assistive device PT Goal: Ambulate - Progress: Goal set today Pt will Go Up / Down Stairs: 3-5 stairs;with supervision;with rail(s) PT Goal: Up/Down Stairs - Progress: Goal set today  Visit Information  Last PT Received On: 11/10/11 Assistance Needed: +1    Subjective Data  Subjective:  How much do you charge to say I can go home? Pt reports he plays 3 games of bowling once/week. Patient Stated Goal: Return home and to bowling.   Prior Functioning  Home Living Lives With: Spouse Available Help at Discharge: Family;Available 24 hours/day Type of Home: House Home Access: Stairs to enter [employer] of Steps: 4 Entrance Stairs-Rails: Right;Left;Can reach both Home Layout: One level Bathroom Shower/Tub: Health visitor: Standard Bathroom Accessibility: Yes How Accessible: Accessible via walker Home Adaptive Equipment: Built-in shower seat;Straight cane;Walker - rolling;Wheelchair - manual;Bedside commode/3-in-1 Prior Function Level of Independence:  Independent with assistive device(s) (used cane "most  of the time" PTA) Able to Take Stairs?: Yes Vocation: Retired Musician: H[employer]    Cognition  Overall Cognitive Status: Appears within functional limits for tasks assessed/performed Arousal/Alertness: Awake/alert Behavior During Session: [facility] for tasks performed    Extremity/Trunk Assessment Right Lower Extremity Assessment RLE ROM/Strength/Tone: [facility] for tasks assessed Left Lower Extremity Assessment LLE ROM/Strength/Tone: [facility] for tasks assessed   Balance Dynamic Standing Balance Dynamic Standing - Balance Support: Right upper extremity supported Dynamic Standing - Level of Assistance: Other (comment) (minguard for safety) Dynamic Standing - Balance Activities: Lateral lean/weight shifting;Reaching for objects;Other (comment) (stepping in place)  End of Session PT - End of Session Equipment Utilized During Treatment: Gait belt Activity Tolerance: Treatment limited secondary to medical complications (Comment) (dyspnea with ? decr SaO2 ) Patient left: in chair;with call bell/phone within reach;with family/visitor present Nurse Communication: Mobility status;Other (comment) (?SaO2 status with mobility)   Kazim Corrales 11/10/2011, 4:07 PM  Pager [phone]

## 2011-11-10 NOTE — Progress Notes (Signed)
SUBJECTIVE:   No chest discomfort.  Some shortness of breath.  Has not walked.  Awaiting PT consult.   No pain like he had in his chest on last Thursday.  Some intermittent confusion.  Ate oatmeal today. Likes regular diet better.     OBJECTIVE:   Vitals:   Filed Vitals:   11/09/11 2306 11/10/11 0400 11/10/11 0401 11/10/11 0804  BP: 138/66  134/62 141/73  Pulse: 110  86 110  Temp: 97.5 F (36.4 C)  97.4 F (36.3 C) 97.9 F (36.6 C)  TempSrc: Oral   Oral  Resp: 24 34  27  Height:      Weight:      SpO2: 98%  97% 92%   I&O's:    Intake/Output Summary (Last 24 hours) at 11/10/11 0915 Last data filed at 11/10/11 0900  Gross per 24 hour  Intake   1151 ml  Output    650 ml  Net    501 ml   TELEMETRY: Reviewed telemetry pt in NS, intermittent atrial flutter, pause > 3 seconds, presumably during sleep at 4AM on 5/21:     PHYSICAL EXAM General: Well developed, well nourished, in no acute distress Head:    Normal cephalic and atramatic  Lungs:  Bibasilar crackles Heart:   RRR S1 S2, 2/6 SEM Abdomen:  abdomen soft and non-tender without masses or                  Hernia's noted. Msk:  Back normal, normal gait. Normal strength and tone for age. Extremities:   tr edema Neuro: Alert and oriented X 3. Psych: Flat affect, responds appropriately   LABS: Basic Metabolic Panel:  Basename 11/10/11 0539 11/09/11 0530  NA 134* 137  K 4.4 3.6  CL 99 100  CO2 23 22  GLUCOSE 132* 149*  BUN 41* 31*  CREATININE 1.06 1.23  CALCIUM 10.3 10.2  MG -- --  PHOS -- --   Liver Function Tests: No results found for this basename: AST:2,ALT:2,ALKPHOS:2,BILITOT:2,PROT:2,ALBUMIN:2 in the last 72 hours No results found for this basename: LIPASE:2,AMYLASE:2 in the last 72 hours CBC:  Basename 11/10/11 0539 11/09/11 0530  WBC 18.9* 17.7*  NEUTROABS -- --  HGB 12.0* 12.1*  HCT 35.8* 36.1*  MCV 95.2 94.8  PLT 197 206   Cardiac Enzymes:  Basename 11/09/11 0530 11/08/11 0848  CKTOTAL  320* 320*  CKMB 20.6* 17.7*  CKMBINDEX -- --  TROPONINI 9.25* 10.35*   BNP: No components found with this basename: POCBNP:3 D-Dimer: No results found for this basename: DDIMER:2 in the last 72 hours Hemoglobin A1C: No results found for this basename: HGBA1C in the last 72 hours Fasting Lipid Panel: No results found for this basename: CHOL,HDL,LDLCALC,TRIG,CHOLHDL,LDLDIRECT in the last 72 hours Thyroid Function Tests: No results found for this basename: TSH,T4TOTAL,FREET3,T3FREE,THYROIDAB in the last 72 hours Anemia Panel: No results found for this basename: VITAMINB12,FOLATE,FERRITIN,TIBC,IRON,RETICCTPCT in the last 72 hours Coag Panel:   Lab Results  Component Value Date   INR 0.98 11/05/2011   INR 1.12 06/12/2010    RADIOLOGY: No results found.    ASSESSMENT: Late presenting acute inferior MI.  Medical therapy.  PLAN:  1) Given age, and comorbidites, we are still continuing with conservative therapy at this time.  Enzymes were slowly trending down.  Most severe pain was last Thursday night. I do not think that benefits outweigh the risks of cardiac cath at this time.  Continue beta blocker, aspirin, statin.  Titrate NTG for chest pain.  Can increase Imdur.  2) Some SHOB. atrial flutter resolved on Increased beta blocker.  Try Lasix again today. Watch Cr.  BUN slightly increased. Replace K.  3)   He wishes to be DNR/DNI and filled out a living will in the past.  Dr. Ladona Ridgel consulted from palliative care.  Will also get PT to work with patient today.  Encouraged PO intake.  Family is very supportive of keeping him comfortable here and at home.  4) Increased WBC.  May be related to recent MI.  Check CXR and U/A.  No fever.    5) Urine output is low.  By echo, he had a dilated IVC which would indicate fluid overload.  Given SHOB, will give higher dose of Lasix. Marland Kitchen  Corky Crafts., MD  11/10/2011  9:15 AM

## 2011-11-11 DIAGNOSIS — M79609 Pain in unspecified limb: Secondary | ICD-10-CM

## 2011-11-11 DIAGNOSIS — R0602 Shortness of breath: Secondary | ICD-10-CM

## 2011-11-11 DIAGNOSIS — F411 Generalized anxiety disorder: Secondary | ICD-10-CM

## 2011-11-11 LAB — CBC
HCT: 35.1 % — ABNORMAL LOW (ref 39.0–52.0)
Hemoglobin: 11.8 g/dL — ABNORMAL LOW (ref 13.0–17.0)
MCV: 95.1 fL (ref 78.0–100.0)
RDW: 13.4 % (ref 11.5–15.5)
WBC: 17 10*3/uL — ABNORMAL HIGH (ref 4.0–10.5)

## 2011-11-11 LAB — BASIC METABOLIC PANEL
BUN: 42 mg/dL — ABNORMAL HIGH (ref 6–23)
Chloride: 99 mEq/L (ref 96–112)
Creatinine, Ser: 0.9 mg/dL (ref 0.50–1.35)
GFR calc Af Amer: 82 mL/min — ABNORMAL LOW (ref >90)
Glucose, Bld: 151 mg/dL — ABNORMAL HIGH (ref 70–99)
Potassium: 4.2 mEq/L (ref 3.5–5.1)

## 2011-11-11 MED ORDER — METOPROLOL TARTRATE 100 MG PO TABS
100.0000 mg | ORAL_TABLET | Freq: Two times a day (BID) | ORAL | Status: DC
  Administered 2011-11-11: 100 mg via ORAL
  Filled 2011-11-11 (×3): qty 1

## 2011-11-11 MED ORDER — LORAZEPAM 2 MG/ML IJ SOLN
1.0000 mg | INTRAMUSCULAR | Status: DC | PRN
  Administered 2011-11-11: 1 mg via INTRAVENOUS
  Filled 2011-11-11: qty 1

## 2011-11-11 MED ORDER — DEXTROSE 5 % IV SOLN
1.0000 g | INTRAVENOUS | Status: DC
  Administered 2011-11-11 – 2011-11-16 (×6): 1 g via INTRAVENOUS
  Filled 2011-11-11 (×7): qty 10

## 2011-11-11 MED ORDER — METOPROLOL TARTRATE 25 MG PO TABS
75.0000 mg | ORAL_TABLET | Freq: Two times a day (BID) | ORAL | Status: DC
  Filled 2011-11-11: qty 1

## 2011-11-11 MED ORDER — FUROSEMIDE 10 MG/ML IJ SOLN
40.0000 mg | Freq: Once | INTRAMUSCULAR | Status: AC
  Administered 2011-11-11: 40 mg via INTRAVENOUS
  Filled 2011-11-11: qty 4

## 2011-11-11 NOTE — Progress Notes (Signed)
HOME HEALTH AGENCIES SERVING [place]   Agencies that are Medicare-Certified and are affiliated with [facility]  Telephone Number Address  [facility]   The [facility] has ownership interest in this company; however, you are under no obligation to use this agency. [phone] or  [phone] [address] [place], [place] [address]   Agencies that are Medicare-Certified and are not affiliated with [facility]                                                                                 Home Health Agency Telephone Number Address  [facility] [phone] Fax [phone] [address], [address] [place], [place]  [address]  [facility] [phone] or [phone] Fax [phone] [address] [place], [place] [address]  [facility] [phone] Fax [phone] [address] [place], [place] [address]  [facility] [phone] Fax [phone] [address], [address] [place], [place]  [address]  [facility] [phone] Fax [phone] [address], [address] , [place] [address]  [facility] [phone] [address] [place], [place] [address]  [facility] [phone]  [address] [place], [place] [address]  [facility] [phone] or [phone] Fax [phone] [address], [address] [place], [place]  [address]  [facility] [phone] Fax [phone] [address] [place], [place]  [address]  [facility]  [phone] Fax [phone] [address] [place],  [address]      

## 2011-11-11 NOTE — Progress Notes (Signed)
Patient is very confused and hallucinating. Family feels that the ativan given earlier to help the patient sleep has caused him to be even more restless and agitated. Family request that no other sedative be given to the patient. Family is at bedside at this time.

## 2011-11-11 NOTE — Progress Notes (Signed)
SUBJECTIVE:   No chest discomfort.  Some shortness of breath.  Has not walked today.  Did not sleep well.    No pain like he had in his chest on last Thursday.  Some intermittent confusion.  Eating better.    OBJECTIVE:   Vitals:   Filed Vitals:   11/10/11 2138 11/10/11 2311 11/11/11 0512 11/11/11 0730  BP:  125/78 143/93 116/85  Pulse: 109 99 123 121  Temp:  97.6 F (36.4 C) 98.8 F (37.1 C) 98 F (36.7 C)  TempSrc:  Oral Oral Oral  Resp:  34 29 29  Height:      Weight:      SpO2:  96% 96% 96%   I&O's:    Intake/Output Summary (Last 24 hours) at 11/11/11 1205 Last data filed at 11/11/11 0900  Gross per 24 hour  Intake    390 ml  Output    980 ml  Net   -590 ml   TELEMETRY: Reviewed telemetry pt in NS, intermittent atrial flutter, pause > 3 seconds, presumably during sleep at 4AM on 5/21:     PHYSICAL EXAM General: Well developed, well nourished, in no acute distress Head:    Normal cephalic and atramatic  Lungs:  Bibasilar crackles Heart:   Tachycardic S1 S2, 2/6 SEM Abdomen:  abdomen soft and non-tender without masses or                  Hernia's noted. Msk:  Back normal, normal gait. Normal strength and tone for age. Extremities:   tr edema Neuro: Alert and oriented X 3. Psych: Flat affect, responds appropriately   LABS: Basic Metabolic Panel:  Basename 11/11/11 0553 11/10/11 0539  NA 137 134*  K 4.2 4.4  CL 99 99  CO2 22 23  GLUCOSE 151* 132*  BUN 42* 41*  CREATININE 0.90 1.06  CALCIUM 10.2 10.3  MG -- --  PHOS -- --   Liver Function Tests: No results found for this basename: AST:2,ALT:2,ALKPHOS:2,BILITOT:2,PROT:2,ALBUMIN:2 in the last 72 hours No results found for this basename: LIPASE:2,AMYLASE:2 in the last 72 hours CBC:  Basename 11/11/11 0553 11/10/11 0539  WBC 17.0* 18.9*  NEUTROABS -- --  HGB 11.8* 12.0*  HCT 35.1* 35.8*  MCV 95.1 95.2  PLT 237 197   Cardiac Enzymes:  Basename 11/09/11 0530  CKTOTAL 320*  CKMB 20.6*  CKMBINDEX  --  TROPONINI 9.25*   BNP: No components found with this basename: POCBNP:3 D-Dimer: No results found for this basename: DDIMER:2 in the last 72 hours Hemoglobin A1C: No results found for this basename: HGBA1C in the last 72 hours Fasting Lipid Panel: No results found for this basename: CHOL,HDL,LDLCALC,TRIG,CHOLHDL,LDLDIRECT in the last 72 hours Thyroid Function Tests: No results found for this basename: TSH,T4TOTAL,FREET3,T3FREE,THYROIDAB in the last 72 hours Anemia Panel: No results found for this basename: VITAMINB12,FOLATE,FERRITIN,TIBC,IRON,RETICCTPCT in the last 72 hours Coag Panel:   Lab Results  Component Value Date   INR 0.98 11/05/2011   INR 1.12 06/12/2010    RADIOLOGY: No results found.    ASSESSMENT: Late presenting acute inferior MI.  Medical therapy.  PLAN:  1) Given age, and comorbidites, we are still continuing with conservative therapy at this time.  Enzymes were slowly trending down.  Most severe pain was last Thursday night. I do not think that benefits outweigh the risks of cardiac cath at this time.  Continue beta blocker, aspirin, statin.  Titrate NTG for chest pain.  Can increase Imdur.  2) Some SHOB. atrial  flutter returned.  Will  Increase beta blocker.  Try Lasix again today. Watch Cr.  BUN slightly increased. Replace K.  3)   He wishes to be DNR/DNI and filled out a living will in the past.  Dr. Ladona Ridgel consulted from palliative care.  Will also get PT to work with patient today.  Encouraged PO intake.  Family is very supportive of keeping him comfortable here and at home.  4) Increased WBC.  May be related to recent MI.   CXR DID not show clear pneumonia.  UA showed positive nitrite, but he does not have any dysuria. will check urine culture.  No fever.  WBC improved today.  5) Urine output better yesterday.  By echo, he had a dilated IVC which would indicate fluid overload.  Given SHOB, will give another dose of Lasix. Marland Kitchen  Corky Crafts., MD    11/11/2011  12:05 PM

## 2011-11-11 NOTE — Progress Notes (Signed)
PT Cancellation Note  Treatment cancelled today due to patient's refusal to participate secondary to lack of sleep. Family also feel rest is appropriate at this time as patient is tachcardic and tachypneic. Patient has been up to chair with nursing today and progression of gait is not appropriate today. We will check back at later date.  Edwyna Perfect, PT  Pager [phone]  11/11/2011, 8:18 AM

## 2011-11-11 NOTE — Progress Notes (Signed)
Patient seen and examined, very anxious, restless. Has  been having sinus tachycardia/? atrial flutter , tachypnea. Patient has elevated WBC and mildly abnormal UA. Patient also has not been sleeping for past few days.  Filed Vitals:   11/11/11 1540  BP: 115/67  Pulse: 121  Temp: 97.3 F (36.3 C)  Resp: 37   Chest : Clear Bilaterally Heart : S1S2 RRR Abdomen: Soft, nontender Ext: No edema  A/P  Anxiety: Ativan 1 mg iv Q 4 hr prn. Sinus tach/A flutter: D/W Dr Eldridge Dace, will increase the dose to Metoprolol 100 mg po BID ? UTI: Has abnormal ua, will obtain urine culture and start Rocephin 1 gm iv daily. Insomnia: Ativan. Above plan was discussed with Dr Eldridge Dace on phone.

## 2011-11-12 DIAGNOSIS — R0602 Shortness of breath: Secondary | ICD-10-CM

## 2011-11-12 DIAGNOSIS — F411 Generalized anxiety disorder: Secondary | ICD-10-CM

## 2011-11-12 LAB — BASIC METABOLIC PANEL
BUN: 40 mg/dL — ABNORMAL HIGH (ref 6–23)
Chloride: 98 mEq/L (ref 96–112)
GFR calc Af Amer: 83 mL/min — ABNORMAL LOW (ref >90)
Potassium: 4 mEq/L (ref 3.5–5.1)

## 2011-11-12 LAB — CBC
HCT: 35.6 % — ABNORMAL LOW (ref 39.0–52.0)
Hemoglobin: 11.8 g/dL — ABNORMAL LOW (ref 13.0–17.0)
MCHC: 33.1 g/dL (ref 30.0–36.0)
WBC: 16.3 10*3/uL — ABNORMAL HIGH (ref 4.0–10.5)

## 2011-11-12 MED ORDER — METOPROLOL TARTRATE 50 MG PO TABS
50.0000 mg | ORAL_TABLET | Freq: Two times a day (BID) | ORAL | Status: DC
  Administered 2011-11-12 – 2011-11-17 (×11): 50 mg via ORAL
  Filled 2011-11-12 (×12): qty 1

## 2011-11-12 MED ORDER — MORPHINE SULFATE 10 MG/5ML PO SOLN
2.6000 mg | ORAL | Status: DC
  Administered 2011-11-12 – 2011-11-14 (×12): 2.6 mg via ORAL
  Filled 2011-11-12 (×12): qty 5

## 2011-11-12 MED ORDER — FUROSEMIDE 40 MG PO TABS
40.0000 mg | ORAL_TABLET | Freq: Every day | ORAL | Status: DC
  Administered 2011-11-13 – 2011-11-17 (×5): 40 mg via ORAL
  Filled 2011-11-12 (×5): qty 1

## 2011-11-12 MED ORDER — AMIODARONE LOAD VIA INFUSION
150.0000 mg | Freq: Once | INTRAVENOUS | Status: AC
  Administered 2011-11-12: 150 mg via INTRAVENOUS
  Filled 2011-11-12: qty 83.34

## 2011-11-12 MED ORDER — AMIODARONE HCL IN DEXTROSE 360-4.14 MG/200ML-% IV SOLN
60.0000 mg/h | INTRAVENOUS | Status: AC
  Administered 2011-11-12: 60 mg/h via INTRAVENOUS
  Filled 2011-11-12: qty 200

## 2011-11-12 MED ORDER — MORPHINE SULFATE (CONCENTRATE) 20 MG/ML PO SOLN: 2.5000 mg | ORAL | Status: DC

## 2011-11-12 MED ORDER — AMIODARONE HCL IN DEXTROSE 360-4.14 MG/200ML-% IV SOLN
30.0000 mg/h | INTRAVENOUS | Status: DC
  Administered 2011-11-12 – 2011-11-13 (×2): 30 mg/h via INTRAVENOUS
  Filled 2011-11-12 (×6): qty 200

## 2011-11-12 NOTE — Progress Notes (Addendum)
Patient JY:NWGNFA F [name]      DOB: 1914-11-03      OZH:[id number]   Palliative Medicine Team at [facility] Progress Note    Subjective:Patient has increased confusion and agitation after dose of Ativan yesterday. Dr Ollen Gross called PMT cell phone requesting another meeting regarding goals of care and symptom management. Patient still remains tachycardic and tachypneic.  Dr Sharl Ma had meeting with wife, step-daughter Ollen Gross, daughter Darl Pikes and grand-daughter present. Talked about patients issue with sleeping over past several days, untoward reaction to ativan yesterday and revisited families goals of care.   Filed Vitals:   11/12/11 1300  BP: 136/80  Pulse: 102  Temp:   Resp: 37   Physical exam: General: Resting in bed, alert/responses appropriate CHEST: CTA bilaterally, respirations labored CVS: rhythm regular, tachy, no MGR ABD: non-distended, BS audible EXT's: no edema   Variable  0 Points 1 Point 2 Points Total  Heart rate per minute  <90 beats 90-109 beats >110 beats 1  Respiratory  Rate per minute < 18 breaths 19-30 breaths  >30 breaths 2  Restlessness; nonpurposeful movements None  occas slight movement Frequent movement 1  Paradoxical breathing pattern: None  Present 2  Accessory muscle use: rise in clavicle during inspiration None Slight rise Pronounced rise 2  Grunting at end-expiration: guttural sound None  Present 0  Nasal flaring: involuntary movement of nares None  Present 0  Look of fear None  Eyes wide 0  Overall total out of 16    8/16    Respiratory Distress Observation Scale Journal of Palliative Medicine Vol. 13, Number 3, 2010 Campbell et al. Page. 285-290    Assessment and plan:   78 yo WM that was admitted on 5/17 admitted after experiencing chest discomfort on 5/16 that relieved after 3 dosings of NTG at home. Was evaluated 5/17 at Dr Suezanne Jacquet at which time EKG showed changes consistent with inferior wall cardiac injury, f/u CK serum  level elevated, at which time patient was admitted for further evaluation and treatment.  1) Code Status: DNR/DNI 2) Symptom Control:      -Dyspnea/Anxiety: discontinued Ativan and Oxycodone as needed orders. Ordered Roxinol 0.25 mg SL every 4 hours scheduled.  3) Psycho-Social: Emotional support to family 4)Disposition:Patient/family still hopeful for discharge home with PT or SNF with rehab home, but family members understand this will be dependant on patients functional progress.   Time In Time Out Total Time Spent with Patient Total Overall Time  12 noon 1:30p 15 min    90 min    Greater than 50%  of this time was spent counseling and coordinating care related to the above assessment and plan.  Shaun [name], CNS-C Palliative Medicine Team [facility] Team Phone: [phone] Pager: [phone]

## 2011-11-12 NOTE — Progress Notes (Signed)
SUBJECTIVE:   No chest discomfort.  Some shortness of breath.  Has not walked today.  He says he slept better but the family states that he did not sleep well.   No pain like he had in his chest on last Thursday.  Some intermittent confusion with Ativan and with ambien in the past.  Eating better.  He has not done well with his home dose of benadryl.  Family states that the best he has done is when he received 2 tylenols prior to bedtime.  Tachycardia has persisted despite increased metoprolol.   OBJECTIVE:   Vitals:   Filed Vitals:   11/11/11 2140 11/11/11 2327 11/12/11 0400 11/12/11 0735  BP: 125/61 127/99 134/70 126/82  Pulse: 122 94 111 112  Temp:  97.4 F (36.3 C) 98.1 F (36.7 C) 97.8 F (36.6 C)  TempSrc:  Oral Oral Oral  Resp:  32 35 21  Height:      Weight:      SpO2:  99% 92% 98%   I&O's:    Intake/Output Summary (Last 24 hours) at 11/12/11 1610 Last data filed at 11/12/11 0600  Gross per 24 hour  Intake    380 ml  Output   1225 ml  Net   -845 ml   TELEMETRY: Reviewed telemetry pt previously in NS, intermittent atrial flutter, pause > 3 seconds, presumably during sleep at 4AM on 5/21:; Now in persistent atrial tachycardia for the past several hours     PHYSICAL EXAM General: Well developed, well nourished, in no acute distress Head:    Normal cephalic and atramatic  Lungs:  Bibasilar crackles Heart:   Tachycardic S1 S2, 2/6 SEM Abdomen:  abdomen soft and non-tender without masses or                  Hernia's noted. Msk:  Back normal, normal gait. Normal strength and tone for age. Extremities:   tr edema Neuro: Alert and oriented X 3. Psych: Flat affect, responds appropriately   LABS: Basic Metabolic Panel:  Basename 11/12/11 0540 11/11/11 0553  NA 135 137  K 4.0 4.2  CL 98 99  CO2 24 22  GLUCOSE 120* 151*  BUN 40* 42*  CREATININE 0.87 0.90  CALCIUM 10.1 10.2  MG -- --  PHOS -- --   Liver Function Tests: No results found for this basename:  AST:2,ALT:2,ALKPHOS:2,BILITOT:2,PROT:2,ALBUMIN:2 in the last 72 hours No results found for this basename: LIPASE:2,AMYLASE:2 in the last 72 hours CBC:  Basename 11/12/11 0540 11/11/11 0553  WBC 16.3* 17.0*  NEUTROABS -- --  HGB 11.8* 11.8*  HCT 35.6* 35.1*  MCV 95.7 95.1  PLT 242 237   Cardiac Enzymes: No results found for this basename: CKTOTAL:3,CKMB:3,CKMBINDEX:3,TROPONINI:3 in the last 72 hours BNP: No components found with this basename: POCBNP:3 D-Dimer: No results found for this basename: DDIMER:2 in the last 72 hours Hemoglobin A1C: No results found for this basename: HGBA1C in the last 72 hours Fasting Lipid Panel: No results found for this basename: CHOL,HDL,LDLCALC,TRIG,CHOLHDL,LDLDIRECT in the last 72 hours Thyroid Function Tests: No results found for this basename: TSH,T4TOTAL,FREET3,T3FREE,THYROIDAB in the last 72 hours Anemia Panel: No results found for this basename: VITAMINB12,FOLATE,FERRITIN,TIBC,IRON,RETICCTPCT in the last 72 hours Coag Panel:   Lab Results  Component Value Date   INR 0.98 11/05/2011   INR 1.12 06/12/2010    RADIOLOGY: No results found.    ASSESSMENT: Late presenting acute inferior MI.  Medical therapy.  PLAN:  1) Given age, and comorbidites, we are  still continuing with conservative therapy at this time.  Enzymes were slowly trending down.  Most severe pain was last Thursday night 5/16. I do not think that benefits outweigh the risks of cardiac cath at this time.  Continue beta blocker, aspirin, statin.  Titrate NTG for chest pain.  Can increase Imdur.  No recent chest pain off of heparin.  2) Some SHOB. atrial tachycardia persisting despite Increased beta blocker.  Start IV amiodarone today for rhythm control.  Can switch to PO once his rhythm converts.  Try Lasix again today. Watch Cr.  BUN slightly increased. Replace K.  3)   He wishes to be DNR/DNI and filled out a living will in the past.  Dr. Ladona Ridgel consulted from palliative care.   Will also get PT to work with patient today.  Encouraged PO intake.  Family is very supportive of keeping him comfortable here and at home.  4) Increased WBC.  May be related to recent MI.   CXR DID not show clear pneumonia.  UA showed positive nitrite, but he does not have any dysuria. will check urine culture.  Rocephin started by palliative care.  WBC improved today.  Condom catheter changed yesterday.  5) Urine output better yesterday.  By echo, he had a dilated IVC which would indicate fluid overload.  Given SHOB, will give another dose of Lasix. Marland Kitchen  Corky Crafts., MD  11/12/2011  8:52 AM

## 2011-11-13 DIAGNOSIS — F411 Generalized anxiety disorder: Secondary | ICD-10-CM

## 2011-11-13 DIAGNOSIS — R0602 Shortness of breath: Secondary | ICD-10-CM

## 2011-11-13 DIAGNOSIS — M79609 Pain in unspecified limb: Secondary | ICD-10-CM

## 2011-11-13 LAB — CBC
HCT: 35.8 % — ABNORMAL LOW (ref 39.0–52.0)
Hemoglobin: 11.7 g/dL — ABNORMAL LOW (ref 13.0–17.0)
MCH: 31.4 pg (ref 26.0–34.0)
MCHC: 32.7 g/dL (ref 30.0–36.0)
MCV: 96 fL (ref 78.0–100.0)

## 2011-11-13 LAB — BASIC METABOLIC PANEL
BUN: 45 mg/dL — ABNORMAL HIGH (ref 6–23)
Chloride: 100 mEq/L (ref 96–112)
Glucose, Bld: 149 mg/dL — ABNORMAL HIGH (ref 70–99)
Potassium: 4.1 mEq/L (ref 3.5–5.1)

## 2011-11-13 LAB — URINE CULTURE

## 2011-11-13 MED ORDER — DOCUSATE SODIUM 100 MG PO CAPS
200.0000 mg | ORAL_CAPSULE | Freq: Two times a day (BID) | ORAL | Status: DC
  Administered 2011-11-13 – 2011-11-17 (×8): 200 mg via ORAL
  Filled 2011-11-13 (×9): qty 2

## 2011-11-13 MED ORDER — AMIODARONE HCL 200 MG PO TABS
200.0000 mg | ORAL_TABLET | Freq: Two times a day (BID) | ORAL | Status: DC
  Administered 2011-11-13 – 2011-11-17 (×9): 200 mg via ORAL
  Filled 2011-11-13 (×10): qty 1

## 2011-11-13 NOTE — Progress Notes (Signed)
Subjective:  Still did not sleep well.  No SOB or chest pain.  Begun on Roxinal yesterday per palliative care.  Appears comfortable and alert this am.  Objective:  Vital Signs in the last 24 hours: BP 92/63  Pulse 77  Temp(Src) 97.5 F (36.4 C) (Oral)  Resp 20  Ht 5\' 6"  (1.676 m)  Wt 75.8 kg (167 lb 1.7 oz)  BMI 26.97 kg/m2  SpO2 95%  Physical Exam: Elderly alert WM in NAD Lungs:  Clear to A&P Cardiac:  irregular rhythm, normal S1 and S2, no S3 Abdomen:  Soft, nontender, no masses Extremities:  No edema present  Intake/Output from previous day: 05/24 0701 - 05/25 0700 In: 1213.7 [P.O.:540; I.V.:623.7; IV Piggyback:50] Out: 675 [Urine:675] Weight Filed Weights   11/05/11 2045  Weight: 75.8 kg (167 lb 1.7 oz)    Lab Results: Basic Metabolic Panel:  Basename 11/13/11 0555 11/12/11 0540  NA 138 135  K 4.1 4.0  CL 100 98  CO2 25 24  GLUCOSE 149* 120*  BUN 45* 40*  CREATININE 1.12 0.87    CBC:  Basename 11/13/11 0555 11/12/11 0540  WBC 16.9* 16.3*  NEUTROABS -- --  HGB 11.7* 11.8*  HCT 35.8* 35.6*  MCV 96.0 95.7  PLT 302 242    BNP    Component Value Date/Time   PROBNP 8222.0* 11/07/2011 1630    Telemetry: Atrial fibrillation with controlled response  Assessment/Plan:  1.  Recent inferior MI with CHF 2. Atrial tachycardia/fibrillation 3. DNR  Rec:  D/c IV amiodarone and change to po.  Advance activity and try to get home.  Darden Palmer  MD [facility]  11/13/2011, 10:24 AM

## 2011-11-13 NOTE — Progress Notes (Signed)
Subjective: patient seen, heart rate controlled, breathing better.Appears well rested. Was started on Amiodarone per cardiology . Also was started on scheduled roxanol 2.5 mg po q 4 hrs.  Filed Vitals:   11/13/11 1216  BP: 101/64  Pulse: 78  Temp:   Resp:     Chest: Clear Bilaterally Heart : S1S2 RRR Abdomen: Soft, nontender Ext : No edema Neuro: Alert, oriented x 3  A/P Dyspnea; Improved. Will continue with Roxanol for one more day then make it PRN  UTI Urine culture is growing GNR, final report is pending. Continue rocephin Follow urine culture   Atrial flutter Heart rate improved with amiodarone.

## 2011-11-14 LAB — BASIC METABOLIC PANEL
BUN: 48 mg/dL — ABNORMAL HIGH (ref 6–23)
Calcium: 10 mg/dL (ref 8.4–10.5)
GFR calc non Af Amer: 55 mL/min — ABNORMAL LOW (ref >90)
Glucose, Bld: 126 mg/dL — ABNORMAL HIGH (ref 70–99)

## 2011-11-14 MED ORDER — MORPHINE SULFATE 10 MG/5ML PO SOLN
2.6000 mg | ORAL | Status: DC | PRN
Start: 2011-11-14 — End: 2011-11-16

## 2011-11-14 NOTE — Progress Notes (Signed)
Subjective:  Still did not sleep well.  Mild SOB.  Able to take a few steps and up in chair Appears comfortable and alert this am.  Objective:  Vital Signs in the last 24 hours: BP 121/74  Pulse 99  Temp(Src) 97.7 F (36.5 C) (Oral)  Resp 18  Ht 5\' 6"  (1.676 m)  Wt 75.8 kg (167 lb 1.7 oz)  BMI 26.97 kg/m2  SpO2 96%  Physical Exam: Elderly alert WM in NAD Lungs:  Clear to A&P Cardiac:  regular rhythm, normal S1 and S2, no S3, 2/6 systolic murmur Abdomen:  Soft, nontender, no masses Extremities:  No edema present  Intake/Output from previous day: 05/25 0701 - 05/26 0700 In: 956.8 [P.O.:860; I.V.:96.8] Out: 825 [Urine:825] Weight Filed Weights   11/05/11 2045  Weight: 75.8 kg (167 lb 1.7 oz)    Lab Results: Basic Metabolic Panel:  Basename 11/14/11 0535 11/13/11 0555  NA 139 138  K 4.6 4.1  CL 100 100  CO2 31 25  GLUCOSE 126* 149*  BUN 48* 45*  CREATININE 1.11 1.12    CBC:  Basename 11/13/11 0555 11/12/11 0540  WBC 16.9* 16.3*  NEUTROABS -- --  HGB 11.7* 11.8*  HCT 35.8* 35.6*  MCV 96.0 95.7  PLT 302 242    BNP    Component Value Date/Time   PROBNP 8222.0* 11/07/2011 1630    Telemetry: Regular rhythm.  Difficult to tell if sinus or a tach with controlled response  Assessment/Plan:  1.  Recent inferior MI with CHF 2. Atrial tachycardia/fibrillation 3. DNR  Rec:  Advance activity and try to get home.  I will ask PT to see to help with ambulation.  Should be able to go home under Hospice.   Darden Palmer  MD [facility]  11/14/2011, 10:38 AM

## 2011-11-14 NOTE — Progress Notes (Signed)
Subjective: Patient seen, slept good last night. Breathing is much improved.Urine culture is growing Klebsiella, which is sensitive to Rocephin.  Filed Vitals:   11/14/11 0804  BP:   Pulse:   Temp: 97.7 F (36.5 C)  Resp: 18    Chest: Clear Bilaterally Heart : S1S2 RRR Abdomen: Soft, nontender Ext : No edema Neuro: Alert, oriented x 3  A/P Dyspnea Improved Will change the Roxanol to prn  UTI Klebsiella Cont  Rocephin   Insomnia Resolved    Meredeth Ide Palliative Medicine Team [phone]

## 2011-11-15 DIAGNOSIS — F411 Generalized anxiety disorder: Secondary | ICD-10-CM

## 2011-11-15 DIAGNOSIS — R0602 Shortness of breath: Secondary | ICD-10-CM

## 2011-11-15 LAB — CBC
MCH: 31.7 pg (ref 26.0–34.0)
MCHC: 32.7 g/dL (ref 30.0–36.0)
Platelets: 346 10*3/uL (ref 150–400)

## 2011-11-15 MED ORDER — POLYETHYLENE GLYCOL 3350 17 G PO PACK
17.0000 g | PACK | Freq: Every day | ORAL | Status: DC
  Administered 2011-11-15 – 2011-11-17 (×3): 17 g via ORAL
  Filled 2011-11-15 (×4): qty 1

## 2011-11-15 MED ORDER — BISACODYL 5 MG PO TBEC
5.0000 mg | DELAYED_RELEASE_TABLET | Freq: Every day | ORAL | Status: DC | PRN
  Administered 2011-11-15: 5 mg via ORAL
  Filled 2011-11-15: qty 1

## 2011-11-15 NOTE — Progress Notes (Signed)
Physical Therapy Treatment Patient Details Name: Shaun [name] MRN: [id number] DOB: 1914/07/21 Today's Date: 11/15/2011 Time: 4098-1191 PT Time Calculation (min): 41 min  PT Assessment / Plan / Recommendation Comments on Treatment Session  Pt's O2 sats dropped to 82% with activity on RA, returned to 96% with rest (96% on RA before ambulation).  Pt is unsteady without RW, recommend for home.  Pt's wife concerned about taking care of pt when they go home.  Recommend 24 hr supervision for home, [facility] would be beneficial as well as HHPT/OT. However, given wife's inability to care for pt physically with her medical conditions, Short-term SNF rehab would also be a safe option, family considering.  Request SW consult to discuss this further.  Pt's wife concerned about getting inthe steps of the house, bed mobility, toileting issues (apparently pt was having incontinence issues PTA), and self care.  PT will continue to follow.    Follow Up Recommendations  Home health PT;Supervision/Assistance - 24 hour;Other (comment);Skilled nursing facility [facility] vs SNF for short term rehab, [facility] if d/c home)    Barriers to Discharge        Equipment Recommendations  Rolling walker with 5" wheels;Other (comment) (if d/c home)    Recommendations for Other Services OT consult  Frequency Min 3X/week   Plan Frequency remains appropriate;Discharge plan needs to be updated;Equipment recommendations need to be updated    Precautions / Restrictions Precautions Precautions: Other (comment) (watch HR and O2) Restrictions Weight Bearing Restrictions: No   Pertinent Vitals/Pain No c/o pain O2 sats before ambulation on RA 96% O2 sats with ambulation on RA  82% O2 sats after ambulation with rest 96%    Mobility  Bed Mobility Bed Mobility: Not assessed Transfers Transfers: Sit to Stand;Stand to Sit Sit to Stand: 5: Supervision;With upper extremity assist;With armrests;From chair/3-in-1 Stand to Sit: 5:  Supervision;With upper extremity assist;To chair/3-in-1 Details for Transfer Assistance: vc's for hand placement with RW which pt did not follow, eduaction given on safety in regards to RW.  Pt did not use a RW PTA.   Ambulation/Gait Ambulation/Gait Assistance: 4: Min assist Ambulation Distance (Feet): 200 Feet Assistive device: Rolling walker Ambulation/Gait Assistance Details: Pt used RW for 150' with min-guard A, vc's for upright posture but despite this, pt tended to keep trunk flexed and stay back from RW.  Pt used cane PTA so tried ambulation with only HHA on right (uses cane on R), and pt needed min A and became unsteady with decreased insight into decreased safety.  1 LOB with min A to correct.  Pt given RW back after this.  Recommend RW for d/c. Gait Pattern: Trunk flexed;Shuffle Gait velocity: decreased Stairs: No Wheelchair Mobility Wheelchair Mobility: No        PT Goals Acute Rehab PT Goals PT Goal Formulation: With patient/family Time For Goal Achievement: 11/17/11 Potential to Achieve Goals: Good Pt will go Supine/Side to Sit: with modified independence;Other (comment) Pt will go Sit to Stand: with modified independence;with upper extremity assist PT Goal: Sit to Stand - Progress: Progressing toward goal Pt will Ambulate: 51 - 150 feet;with modified independence;with least restrictive assistive device PT Goal: Ambulate - Progress: Progressing toward goal Pt will Go Up / Down Stairs: 3-5 stairs;with supervision;with rail(s) PT Goal: Up/Down Stairs - Progress: Progressing toward goal  Visit Information  Last PT Received On: 11/15/11 Assistance Needed: +1    Subjective Data  Subjective: I am ready to go home Patient Stated Goal: return home   Cognition  Overall Cognitive Status: Appears within functional limits for tasks assessed/performed Arousal/Alertness: Awake/alert Orientation Level: Oriented X4 / Intact Behavior During Session: [facility] for tasks performed      Balance  Balance Balance Assessed: Yes Dynamic Standing Balance Dynamic Standing - Balance Support: Right upper extremity supported;During functional activity Dynamic Standing - Level of Assistance: 4: Min assist  End of Session PT - End of Session Equipment Utilized During Treatment: Gait belt Activity Tolerance: Patient tolerated treatment well Patient left: in chair;with call bell/phone within reach;with family/visitor present Nurse Communication: Mobility status  Lyanne Co, PT  Acute Rehab Services  [phone]   Lyanne Co 11/15/2011, 2:18 PM

## 2011-11-15 NOTE — Progress Notes (Signed)
Patient ZO:XWRUEA F Friedmann      DOB: 05-25-1915      VWU:[id number]   Palliative Medicine Team at [facility] Progress Note    Subjective: Patient awake/alert lying in bed, stated that he slept well last night. Does have c/o constipation, is having flatus, received MOM x 2 doses on 5/25 and 5/25, is scheduled to get suppository this morning. Wife and grand-son at bedside.     Filed Vitals:   11/15/11 0900  BP:   Pulse: 102  Temp:   Resp:    Physical exam: General: Alert/comfortable, does not appear anxious or dyspneic at rest HEENT: anicteric, buccal mucosa moist CHEST: CTA bilaterally CVS: S1/S2, no MGR ABD: non-distended, non-tender, BS audible  GU: foley d/w medium amber clear urine EXT:no pedal edema  Assessment and plan: 78 yo WM that was admitted on 5/17 admitted after experiencing chest discomfort on 5/16 that relieved after 3 dosings of NTG at home. Was evaluated 5/17 at Dr Suezanne Jacquet at which time EKG showed changes consistent with inferior wall cardiac injury, f/u CK serum level elevated, at which time patient was admitted for further evaluation and treatment.   1) Code Status: DNR/DNI  2) Symptom Control:  -Dyspnea/Anxiety: patient tolerated previously scheduled dosing of morphine, now as needed.Last dose 5/26 at 5:00a. Patient encouraged to ask for med if he becomes dyspneic/anxious.  -Insomnia: patient admits to sleeping well last night - UTI: + C&S for Klebsiella, on Rocephin, will repeat CBC this morning, previous result showed leukocytosis -Constipation: one BM since admission 5/17, appetite has been good, s/p 2 doses of MOM not effective, to get Ducolax suppository this morning. Will f/u. 3) Psycho-Social: Emotional support to family  4)Disposition:Patient/family still hopeful for discharge home with PT or SNF with rehab home, but family members understand this will be dependant on patients functional progress.   Time In Time Out Total Time Spent with  Patient Total Overall Time  9:00a 9:30a 30 min 30 min   Greater than 50%  of this time was spent counseling and coordinating care related to the above assessment and plan.  Freddie Breech, CNS-C Palliative Medicine Team [facility] Team Phone: [phone] Pager: [phone]

## 2011-11-15 NOTE — Progress Notes (Signed)
Subjective:  Sleeping better.  C/o constipation.  No chest pain.  Not SOB and feels better. PT has not seen.  Objective:  Vital Signs in the last 24 hours: BP 129/71  Pulse 102  Temp(Src) 97.7 F (36.5 C) (Oral)  Resp 22  Ht 5\' 6"  (1.676 m)  Wt 75.8 kg (167 lb 1.7 oz)  BMI 26.97 kg/m2  SpO2 96%  Physical Exam: Elderly alert WM in NAD Lungs:  Clear to A&P Cardiac:  regular rhythm, normal S1 and S2, no S3, 2/6 systolic murmur Abdomen:  Soft, nontender, no masses Extremities:  No edema present  Intake/Output from previous day: 05/26 0701 - 05/27 0700 In: 823 [P.O.:720; I.V.:3; IV Piggyback:100] Out: 860 [Urine:860] Weight Filed Weights   11/05/11 2045  Weight: 75.8 kg (167 lb 1.7 oz)    Lab Results: Basic Metabolic Panel:  Basename 11/14/11 0535 11/13/11 0555  NA 139 138  K 4.6 4.1  CL 100 100  CO2 31 25  GLUCOSE 126* 149*  BUN 48* 45*  CREATININE 1.11 1.12    CBC:  Basename 11/13/11 0555  WBC 16.9*  NEUTROABS --  HGB 11.7*  HCT 35.8*  MCV 96.0  PLT 302    BNP    Component Value Date/Time   PROBNP 8222.0* 11/07/2011 1630    Telemetry: Regular rhythm.  Difficult to tell if sinus or a tach with controlled response  Assessment/Plan:  1.  Recent inferior MI with CHF 2. Atrial tachycardia/fibrillation maybe in sinus 3. DNR  Rec:  Advance activity and try to get home.Miralax for constipation.  I will ask PT to see to help with ambulation.  Should be able to go home under Hospice.   Darden Palmer  MD [facility] Cardiology  11/15/2011, 10:30 AM

## 2011-11-16 DIAGNOSIS — M79609 Pain in unspecified limb: Secondary | ICD-10-CM

## 2011-11-16 DIAGNOSIS — F411 Generalized anxiety disorder: Secondary | ICD-10-CM

## 2011-11-16 DIAGNOSIS — R0602 Shortness of breath: Secondary | ICD-10-CM

## 2011-11-16 LAB — GLUCOSE, CAPILLARY: Glucose-Capillary: 120 mg/dL — ABNORMAL HIGH (ref 70–99)

## 2011-11-16 MED ORDER — BIOTENE DRY MOUTH MT LIQD
15.0000 mL | Freq: Two times a day (BID) | OROMUCOSAL | Status: DC
  Administered 2011-11-16 – 2011-11-17 (×2): 15 mL via OROMUCOSAL

## 2011-11-16 MED ORDER — OXYCODONE HCL 5 MG PO TABS
5.0000 mg | ORAL_TABLET | Freq: Four times a day (QID) | ORAL | Status: DC | PRN
  Administered 2011-11-17: 5 mg via ORAL
  Filled 2011-11-16: qty 1

## 2011-11-16 MED ORDER — HEPARIN SODIUM (PORCINE) 5000 UNIT/ML IJ SOLN
5000.0000 [IU] | Freq: Three times a day (TID) | INTRAMUSCULAR | Status: DC
  Administered 2011-11-16 – 2011-11-17 (×4): 5000 [IU] via SUBCUTANEOUS
  Filled 2011-11-16 (×10): qty 1

## 2011-11-16 NOTE — Progress Notes (Signed)
Thank you for consult on Shaun [name].  Chart reviewed and note that patient is progressing along well with mobility.  He's supervision for transfers and min assist for ambulating 200 feet.  Therapy is recommending HHPT  If 24 hours supervision is available. Note wife with concerns about caring for patient and SNF would be a safe option for now. Will defer CIR consult as patient is too high level.

## 2011-11-16 NOTE — Progress Notes (Addendum)
Patient Shaun [name]      DOB: [birth year]      YNW:[id number]   Palliative Medicine Team at [facility] Progress Note    Subjective:Patient doing well stated he has been sleeping well. Participating in PT, walked to the BR and getting up in chair daily.      Filed Vitals:   11/16/11 1215  BP: 120/66  Pulse: 87  Temp: 98.1 F (36.7 C)  Resp: 17   Physical exam: General: Alert/comfortable, does not appear anxious or dyspneic at rest  HEENT: anicteric, buccal mucosa moist  CHEST: CTA bilaterally  CVS: S1/S2, no MGR  ABD: non-distended, non-tender, BS audible  GU: foley d/w medium amber clear urine  EXT:no pedal edema  Variable  0 Points 1 Point 2 Points Total  Heart rate per minute  <90 beats 90-109 beats >110 beats     1  Respiratory  Rate per minute < 18 breaths 19-30 breaths  >30 breaths      0  Restlessness; nonpurposeful movements None  occas slight movement Frequent movement     0  Paradoxical breathing pattern: None  Present      0  Accessory muscle use: rise in clavicle during inspiration None Slight rise Pronounced rise     1  Grunting at end-expiration: guttural sound None  Present      0  Nasal flaring: involuntary movement of nares None  Present     0  Look of fear None  Eyes wide     0  Overall total out of 16       1/16    Respiratory Distress Observation Scale Journal of Palliative Medicine Vol. 13, Number 3, 2010 Campbell et al. Page. 285-290   Assessment and plan:  78 yo WM that was admitted on 5/17 admitted after experiencing chest discomfort on 5/16 that relieved after 3 dosings of NTG at home. Was evaluated 5/17 at Dr Suezanne Jacquet at which time EKG showed changes consistent with inferior wall cardiac injury, f/u CK serum level elevated, at which time patient was admitted for further evaluation and treatment.   1) Code Status: DNR/DNI  2) Symptom Control:  -Dyspnea/Anxiety: has morphine as needed. Patient encouraged to ask for med if he  becomes dyspneic/anxious. Spoke with Dr Leontine Locket would recommend Oxycodone 5 mg every 6 hrs as needed upon discharge. Morphine solution discontinued, oxycodone added to as needed medications. -Insomnia: patient admits to sleeping well last night  - UTI: + C&S for Klebsiella, on Rocephin, 5/27 still with leukocytosis  -Constipation: has moved bowels 5/27 and 5/28 after Miralax 3) Psycho-Social: Emotional support to family  4)Disposition:Patient/family still hopeful for discharge home with PT or SNF with rehab home. Working with CSW for SNF discharge choice.   Time In Time Out Total Time Spent with Patient Total Overall Time  12:30p 1:00p 20 min 30 min   Greater than 50%  of this time was spent counseling and coordinating care related to the above assessment and plan.  Freddie Breech, CNS-C Palliative Medicine Team [facility] Team Phone: [phone] Pager: [phone]

## 2011-11-16 NOTE — Progress Notes (Signed)
SUBJECTIVE:   No chest discomfort.  Some shortness of breath.  Has not walked today. Desats with activity.  He says he slept better.    Some intermittent confusion with Ativan and with ambien in the past.  Eating better.    Tachycardia improved with amiodarone.  Constipation.   OBJECTIVE:   Vitals:   Filed Vitals:   11/15/11 2315 11/16/11 0008 11/16/11 0358 11/16/11 0730  BP: 102/60  115/76 107/67  Pulse: 77  85 97  Temp:  98.2 F (36.8 C) 98.3 F (36.8 C) 98 F (36.7 C)  TempSrc:  Oral Oral Oral  Resp:   18   Height:      Weight:      SpO2: 98%  98% 96%   I&O's:    Intake/Output Summary (Last 24 hours) at 11/16/11 0850 Last data filed at 11/16/11 0600  Gross per 24 hour  Intake    725 ml  Output   1102 ml  Net   -377 ml   TELEMETRY: Reviewed telemetry pt previously in NS, intermittent atrial flutter, pause > 3 seconds, presumably during sleep at 4AM on 5/21:;NSR, sinus tachycardia     PHYSICAL EXAM General: Well developed, well nourished, in no acute distress Head:    Normal cephalic and atramatic  Lungs:  Bibasilar crackles Heart:   Tachycardic S1 S2, 2/6 SEM Abdomen:  abdomen soft and non-tender without masses or                  Hernia's noted. Msk:  Back normal, normal gait. Normal strength and tone for age. Extremities:   tr edema; right hand erythema Neuro: Alert and oriented X 3. Psych: Flat affect, responds appropriately   LABS: Basic Metabolic Panel:  Basename 11/14/11 0535  NA 139  K 4.6  CL 100  CO2 31  GLUCOSE 126*  BUN 48*  CREATININE 1.11  CALCIUM 10.0  MG --  PHOS --   Liver Function Tests: No results found for this basename: AST:2,ALT:2,ALKPHOS:2,BILITOT:2,PROT:2,ALBUMIN:2 in the last 72 hours No results found for this basename: LIPASE:2,AMYLASE:2 in the last 72 hours CBC:  Basename 11/15/11 1008  WBC 15.7*  NEUTROABS --  HGB 11.5*  HCT 35.2*  MCV 97.0  PLT 346   Cardiac Enzymes: No results found for this basename:  CKTOTAL:3,CKMB:3,CKMBINDEX:3,TROPONINI:3 in the last 72 hours BNP: No components found with this basename: POCBNP:3 D-Dimer: No results found for this basename: DDIMER:2 in the last 72 hours Hemoglobin A1C: No results found for this basename: HGBA1C in the last 72 hours Fasting Lipid Panel: No results found for this basename: CHOL,HDL,LDLCALC,TRIG,CHOLHDL,LDLDIRECT in the last 72 hours Thyroid Function Tests: No results found for this basename: TSH,T4TOTAL,FREET3,T3FREE,THYROIDAB in the last 72 hours Anemia Panel: No results found for this basename: VITAMINB12,FOLATE,FERRITIN,TIBC,IRON,RETICCTPCT in the last 72 hours Coag Panel:   Lab Results  Component Value Date   INR 0.98 11/05/2011   INR 1.12 06/12/2010    RADIOLOGY: No results found.    ASSESSMENT: Late presenting acute inferior MI.  Medical therapy.  PLAN:  1) Inferior MI.  Continue beta blocker, aspirin, statin. Can increase Imdur.  No recent chest pain off of heparin.  Initially, patient was to go home with HHPT.  However, this will be too much for wife.  Will obtain inpatient rehab consult per the family's request.    2) Some SHOB. atrial tachycardia improved with amiodarone.  Lasix daily. Watch Cr.  BUN slightly increased. Replace K.  He will need continuous oxygen.  Family  is agreeable.  3)   He wishes to be DNR/DNI and filled out a living will in the past.  Dr. Ladona Ridgel consulted from palliative care.  Will also get PT to work with patient today.  Encouraged PO intake.  Family is very supportive of keeping him comfortable here and at home.  4) Increased WBC.  May be related to recent MI vs. UTI.  Rocephin started by palliative care. Should cover skin organisms affecting right hand.  Would defer to internal medicine as to which oral antibiotic would be used for covering UTI if he is able to be discharged, and how long treatment needs to be.   5) Using condom cath due to issues with incontinence in setting of lasix and being  confined to bed.  Corky Crafts., MD  11/16/2011  8:50 AM

## 2011-11-16 NOTE — Clinical Social Work Placement (Signed)
     Clinical Social Work Department CLINICAL SOCIAL WORK PLACEMENT NOTE 11/17/2011  Patient:  ZARED, KNOTH  Account Number:  [id number] Admit date:  11/05/2011  Clinical Social Worker:  Hulan Fray  Date/time:  11/16/2011 04:51 PM  Clinical Social Work is seeking post-discharge placement for this patient at the following level of care:   SKILLED NURSING   (*CSW will update this form in Epic as items are completed)   11/16/2011  Patient/family provided with [facility] Department of Clinical Social Works list of facilities offering this level of care within the geographic area requested by the patient (or if unable, by the patients family).  11/16/2011  Patient/family informed of their freedom to choose among providers that offer the needed level of care, that participate in Medicare, Medicaid or managed care program needed by the patient, have an available bed and are willing to accept the patient.  11/16/2011  Patient/family informed of [facility] ownership interest in [facility], as well as of the fact that they are under no obligation to receive care at this facility.  PASARR submitted to EDS on 11/16/2011 PASARR number received from EDS on   FL2 transmitted to all facilities in geographic area requested by pt/family on  11/16/2011 FL2 transmitted to all facilities within larger geographic area on   Patient informed that his/her managed care company has contracts with or will negotiate with  certain facilities, including the following:     Patient/family informed of bed offers received:  11/17/2011 Patient chooses bed at [facility] Physician recommends and patient chooses bed at    Patient to be transferred to [facility] on  11/17/2011 Patient to be transferred to facility by Private Vehicle  The following physician request were entered in Epic:   Additional Comments: Patient's wife wants to transport patient to facility but she may change  her mind for EMS transport. CSW will complete EMS form just in case.

## 2011-11-16 NOTE — Progress Notes (Signed)
Physical Therapy Treatment Patient Details Name: Shaun [name] MRN: [id number] DOB: 12-12-1914 Today's Date: 11/16/2011 Time: 7829-5621 PT Time Calculation (min): 33 min  PT Assessment / Plan / Recommendation Comments on Treatment Session  Wife and daughter-in-law present throughout session. Inquiring re: pt's failure to qualify for inpt Rehab. Spoke with Shaun [name], Rehab Admissions Coordinator who referred me to PA Shaun [name]). Discussed case with Shaun [name] and she continues to feel that pt is too high level for inpt Rehab and recommended family look at alternative ways to provide assist in home environment if wife can only provide supervision. Relayed this information to pt/family and they reported they have been trying to find someone to hire to assist wife with no success. Family is unsure if the daughter working on this has talked to Conservation officer, historic buildings for assistance. Again discussed the option of short-term skilled nursing for rehab and family is hoping to avoid this option. Pt continues to require min assist with walking due to decr balance, needing instructional cues re: use of RW and for manipulating O2 tubing. Will continue to follow.    Follow Up Recommendations  Skilled nursing facility;Supervision/Assistance - 24 hour (vs HHPT if family can provide 24/7 (denied by inpt rehab))    Barriers to Discharge        Equipment Recommendations  Defer to next venue    Recommendations for Other Services OT consult  Frequency Min 3X/week   Plan Discharge plan needs to be updated;Frequency remains appropriate    Precautions / Restrictions Precautions Precautions: Fall;Other (comment) (decr SaO2 with activity)   Pertinent Vitals/Pain Denies pain; SaO2 decr to 84% on 3L with walking; returned to 90% in ~45 sec with seated rest    Mobility  Bed Mobility Bed Mobility: Rolling Left;Left Sidelying to Sit;Sitting - Scoot to Edge of Bed Rolling Right: 6: Modified independent  (Device/Increase time);With rail Right Sidelying to Sit: 6: Modified independent (Device/Increase time);HOB elevated (HOB 15, no rail) Sitting - Scoot to Edge of Bed: 6: Modified independent (Device/Increase time) Details for Bed Mobility Assistance: pt requires incr time/effort to complete tasks; safety not an issue Transfers Transfers: Sit to Stand;Stand to Sit Sit to Stand: 5: Supervision;With upper extremity assist;From bed;From toilet;With armrests;From chair/3-in-1 (grab bar) Stand to Sit: 5: Supervision;With upper extremity assist;To bed;To chair/3-in-1;To toilet;With armrests (grab bar) Details for Transfer Assistance: vc's for hand placement/safe use of RW when standing from bed; to other surfaces did not need cues; supervision for safety Ambulation/Gait Ambulation/Gait Assistance: 4: Min assist Ambulation Distance (Feet): 160 Feet Assistive device: Rolling walker Ambulation/Gait Assistance Details: Pt continues with flexed trunk and pushes RW too far ahead (in part due to RW too tall and adjusted to lowest height). Assist with turning RW (for balance and placing RW). Assist with manipulating oxygen tubing for safety. Gait Pattern: Trunk flexed;Shuffle Gait velocity: decreased    Exercises Other Exercises Other Exercises: Pt instructed in deep breathing exercises and use of pursed lip breathing while walking and encouraged to do at rest throughout the day.   PT Diagnosis:    PT Problem List:   PT Treatment Interventions:     PT Goals Acute Rehab PT Goals PT Goal: Supine/Side to Sit - Progress: Progressing toward goal (needs to do with HOB 0 and no rail) PT Goal: Sit to Stand - Progress: Progressing toward goal PT Goal: Ambulate - Progress: Progressing toward goal  Visit Information  Last PT Received On: 11/16/11 Assistance Needed: +1    Subjective Data  Subjective:  Wife "I just can't manage him like this...if he were to begin to fall, I could not catch him" Patient Stated  Goal: Get stronger to go home   Cognition       Balance     End of Session PT - End of Session Equipment Utilized During Treatment: Gait belt;Oxygen Activity Tolerance: Patient limited by fatigue (and decr SaO2) Patient left: in chair;with call bell/phone within reach;with family/visitor present Nurse Communication: Mobility status;Other (comment) (discussed d/c planning)    Shaun [name] 11/16/2011, 2:55 PM Pager [phone]

## 2011-11-16 NOTE — Clinical Social Work Psychosocial (Signed)
     Clinical Social Work Department BRIEF PSYCHOSOCIAL ASSESSMENT 11/16/2011  Patient:  Shaun [name], Shaun [name]     Account Number:  [id number]     Admit date:  11/05/2011  Clinical Social Worker:  Hulan Fray  Date/Time:  11/16/2011 04:40 PM  Referred by:  Care Management  Date Referred:  11/16/2011 Referred for  SNF Placement   Other Referral:   Interview type:  Other - See comment Other interview type:   patient and family, wife and daughter in law    PSYCHOSOCIAL DATA Living Status:  WIFE Admitted from facility:   Level of care:   Primary support name:  Shaun [name] Primary support relationship to patient:  SPOUSE Degree of support available:   supportive    CURRENT CONCERNS Current Concerns  Post-Acute Placement   Other Concerns:    SOCIAL WORK ASSESSMENT / PLAN Clinical Social Worker received consult from CM regarding change in disposition. CSW spoke with patient's wife regarding the possibilty of patient being discharged to SNF, short term. CIR was not able to take patient because he had progressed very well. Wife appeared concerned about CIR not able to take patient. CSW provided wife with SNF packet and she was agreeable to CSW faxing patient information to [facility]. to see the availability. CSW received call from patient's daughter Shaun [name] (c:[phone]) regarding disposition of SNF placement. CSW answered daughter's questions and she was appreciative of CSW's assistance. CSW will followup with family regarding bed offers received. CSW will complete FL2 for MD's signature and continue to follow.   Assessment/plan status:  Psychosocial Support/Ongoing Assessment of Needs Other assessment/ plan:   Information/referral to community resources:   SNF packet    PATIENTS/FAMILYS RESPONSE TO PLAN OF CARE: Family was appreciative of CSW's assistance with discharge disposition of SNF placement. Family is agreeable to SNF search in [place].

## 2011-11-17 ENCOUNTER — Encounter (HOSPITAL_COMMUNITY): Payer: Self-pay

## 2011-11-17 LAB — BASIC METABOLIC PANEL
Calcium: 9.6 mg/dL (ref 8.4–10.5)
GFR calc Af Amer: 80 mL/min — ABNORMAL LOW (ref >90)
GFR calc non Af Amer: 69 mL/min — ABNORMAL LOW (ref >90)
Sodium: 141 mEq/L (ref 135–145)

## 2011-11-17 MED ORDER — AMIODARONE HCL 200 MG PO TABS: 200.0000 mg | ORAL_TABLET | Freq: Two times a day (BID) | ORAL | Status: DC

## 2011-11-17 MED ORDER — FUROSEMIDE 40 MG PO TABS: 40.0000 mg | ORAL_TABLET | Freq: Every day | ORAL | Status: DC

## 2011-11-17 MED ORDER — CLOPIDOGREL BISULFATE 75 MG PO TABS: 75.0000 mg | ORAL_TABLET | Freq: Every day | ORAL | Status: DC

## 2011-11-17 MED ORDER — SULFAMETHOXAZOLE-TRIMETHOPRIM 400-80 MG PO TABS: 1.0000 | ORAL_TABLET | Freq: Two times a day (BID) | ORAL | Status: AC

## 2011-11-17 MED ORDER — OXYCODONE HCL 5 MG PO TABS: 5.0000 mg | ORAL_TABLET | Freq: Four times a day (QID) | ORAL | Status: AC | PRN

## 2011-11-17 MED ORDER — POTASSIUM CHLORIDE CRYS ER 20 MEQ PO TBCR: 40.0000 meq | EXTENDED_RELEASE_TABLET | Freq: Every day | ORAL | Status: DC

## 2011-11-17 MED ORDER — OMEPRAZOLE 20 MG PO CPDR: 20.0000 mg | DELAYED_RELEASE_CAPSULE | Freq: Every day | ORAL | Status: AC

## 2011-11-17 MED ORDER — ACETAMINOPHEN 325 MG PO TABS: 650.0000 mg | ORAL_TABLET | ORAL | Status: DC | PRN

## 2011-11-17 MED ORDER — ALUM & MAG HYDROXIDE-SIMETH 200-200-20 MG/5ML PO SUSP: 15.0000 mL | ORAL | Status: AC | PRN

## 2011-11-17 MED ORDER — SULFAMETHOXAZOLE-TRIMETHOPRIM 400-80 MG PO TABS
1.0000 | ORAL_TABLET | Freq: Two times a day (BID) | ORAL | Status: DC
  Filled 2011-11-17 (×3): qty 1

## 2011-11-17 MED ORDER — ASPIRIN 81 MG PO TBEC: 81.0000 mg | DELAYED_RELEASE_TABLET | Freq: Every day | ORAL | Status: DC

## 2011-11-17 MED ORDER — DSS 100 MG PO CAPS: 200.0000 mg | ORAL_CAPSULE | Freq: Two times a day (BID) | ORAL | Status: AC

## 2011-11-17 MED ORDER — BISACODYL 5 MG PO TBEC: 5.0000 mg | DELAYED_RELEASE_TABLET | Freq: Every day | ORAL | Status: AC | PRN

## 2011-11-17 NOTE — Discharge Instructions (Signed)
Use oxycodone for shortness of breath.

## 2011-11-17 NOTE — Discharge Summary (Addendum)
Patient ID: Shaun [name] MRN: [id number] DOB/AGE: Jul 05, 1914 78 y.o.  Admit date: 11/05/2011 Discharge date: 11/17/2011  Primary Discharge Diagnosis Acute inferior MI Secondary Discharge Diagnosis Urinary tract infection; confusion; atrial tachycardia  Significant Diagnostic Studies: cardiac graphics: Echocardiogram: LVEF 40-45%  Consults: palliative care  Hospital Course: 78 year old man who was admitted after having evidence of an inferior myocardial infarction.  He had pain the previous night but he went to bed.  He came to our office and the ECG abnormality was noted.  He was admitted from there.  He was heparinized for a couple of days in the hospital.  He tolerated this well.  On hospital day #4, the heparin was stopped and he developed some chest discomfort and shortness of breath.  Heparin was restarted for an additional 48 hours and his symptoms resolved.  He was managed with some beta blockers as well as long-acting nitrates as well.  He was started on Plavix.  His blood pressure was low initially.  He also had atrial tachycardia which was treated with beta blockers.  Eventually, amiodarone was used which converted the atrial tachycardia back to normal sinus rhythm.  He was also found to have a urinary tract infection significant for Klebsiella.  This was treated with Rocephin for 5 days in the hospital.  He was seen by palliative care due to his advanced age.  He was evaluated by physical therapy.  Initially, it was thought that he could have home health physical therapy and be discharged, but then it was decided that it would be too much for his wife to handle.  Initially, the family wanted to go to inpatient rehabilitation but he was deemed not to be a candidate.  They chose [facility] which is where he will be going.  Of note, in the hospital, he had some intermittent confusion, particularly at night.  He had difficulty sleeping.  We tried Benadryl which he takes at home.  This did  not help.  Palliative care had prescribed liquid morphine.  This was subsequently changed to immediate release oxycodone.  This was to help with shortness of breath.  He had episodes of shortness of breath which were treated with IV Lasix and subsequently oral Lasix.  This then was also treated with Ativan which made him confused.  While walking with PT, he would desaturate to the 80s.  No problems at rest.  He will be discharged with a prescription for 2 L [place] with activity.   Once it was decided that he could not go home.  Social work was consulted and a Financial controller for skilled nursing facilities started.  He will be going to [facility].  Upon leaving the hospital, his CODE STATUS was again discussed with him.  All throughout the hospitalization, he was a DO NOT RESUSCITATE based on prior wishes he had expressed his family.  The patient also had declined any ventilator support or CPR.  However at the time of discharge, he changed his mind and is willing to accept CPR or ventilator support.  His DO NOT RESUSCITATE was reversed upon discharge.  It may be beneficial to have other family members speak to him about this regarding his sudden change of opinion.   Discharge Exam: Blood pressure 87/61, pulse 73, temperature 97.8 F (36.6 C), temperature source Oral, resp. rate 18, height 5\' 6"  (1.676 m), weight 75.8 kg (167 lb 1.7 oz), SpO2 97.00%.   [name]/AT RRR, S1 S2 Scant bibasilar crackles Abdomen soft nontender No pedal edema  Right upper extremity shows an area of redness at an IV site which has decreased in size.  Labs:   Lab Results  Component Value Date   WBC 15.7* 11/15/2011   HGB 11.5* 11/15/2011   HCT 35.2* 11/15/2011   MCV 97.0 11/15/2011   PLT 346 11/15/2011    Lab 11/17/11 0544  NA 141  K 3.9  CL 101  CO2 28  BUN 30*  CREATININE 0.95  CALCIUM 9.6  PROT --  BILITOT --  ALKPHOS --  ALT --  AST --  GLUCOSE 105*   Lab Results  Component Value Date   CKTOTAL 320* 11/09/2011   CKMB  20.6* 11/09/2011   TROPONINI 9.25* 11/09/2011    Lab Results  Component Value Date   CHOL 160 11/06/2011   Lab Results  Component Value Date   HDL 49 11/06/2011   Lab Results  Component Value Date   LDLCALC 89 11/06/2011   Lab Results  Component Value Date   TRIG 111 11/06/2011   Lab Results  Component Value Date   CHOLHDL 3.3 11/06/2011   No results found for this basename: LDLDIRECT      Radiology: Chest x-ray showing bilateral pleural effusions and enlarged heart size. EKG: Normal sinus rhythm, persistent inferior ST elevation with Q waves  FOLLOW UP PLANS AND APPOINTMENTS  Medication List  As of 11/17/2011  3:13 PM   STOP taking these medications         diclofenac 75 MG EC tablet      metoprolol succinate 50 MG 24 hr tablet      potassium citrate 10 MEQ (1080 MG) SR tablet         TAKE these medications         acetaminophen 325 MG tablet   Commonly known as: TYLENOL   Take 2 tablets (650 mg total) by mouth every 4 (four) hours as needed.      alum & mag hydroxide-simeth 200-200-20 MG/5ML suspension   Commonly known as: MAALOX/MYLANTA   Take 15-30 mLs by mouth every 4 (four) hours as needed.      amiodarone 200 MG tablet   Commonly known as: PACERONE   Take 1 tablet (200 mg total) by mouth 2 (two) times daily.      aspirin 81 MG EC tablet   Take 1 tablet (81 mg total) by mouth daily.      bisacodyl 5 MG EC tablet   Commonly known as: DULCOLAX   Take 1 tablet (5 mg total) by mouth daily as needed.      clopidogrel 75 MG tablet   Commonly known as: PLAVIX   Take 1 tablet (75 mg total) by mouth daily with breakfast.      DSS 100 MG Caps   Take 200 mg by mouth 2 (two) times daily.      ezetimibe-simvastatin 10-40 MG per tablet   Commonly known as: VYTORIN   Take 1 tablet by mouth at bedtime.      furosemide 40 MG tablet   Commonly known as: LASIX   Take 1 tablet (40 mg total) by mouth daily.      gabapentin 300 MG capsule   Commonly known as:  NEURONTIN   Take 300 mg by mouth 3 (three) times daily.      isosorbide mononitrate 60 MG 24 hr tablet   Commonly known as: IMDUR   Take 60 mg by mouth daily.      loratadine 10 MG tablet  Commonly known as: CLARITIN   Take 10 mg by mouth daily.      multivitamins ther. w/minerals Tabs   Take 1 tablet by mouth daily.      beta carotene w/minerals tablet   Take 1 tablet by mouth daily.      nitroGLYCERIN 0.4 MG SL tablet   Commonly known as: NITROSTAT   Place 0.4 mg under the tongue every 5 (five) minutes as needed. For chest pain.      omeprazole 20 MG capsule   Commonly known as: PRILOSEC   Take 1 capsule (20 mg total) by mouth daily.      oxyCODONE 5 MG immediate release tablet   Commonly known as: Oxy IR/ROXICODONE   Take 1 tablet (5 mg total) by mouth every 6 (six) hours as needed (use for dyspnea).      potassium chloride SA 20 MEQ tablet   Commonly known as: K-DUR,KLOR-CON   Take 2 tablets (40 mEq total) by mouth daily.      sulfamethoxazole-trimethoprim 400-80 MG per tablet   Commonly known as: BACTRIM,SEPTRA   Take 1 tablet by mouth every 12 (twelve) hours.      vitamin C 500 MG tablet   Commonly known as: ASCORBIC ACID   Take 500 mg by mouth daily.           Follow-up Information    Follow up with Corky Crafts., MD in 3 weeks.   Contact information:   [address] [place] [address] [phone]          BRING ALL MEDICATIONS WITH YOU TO FOLLOW UP APPOINTMENTS  Time spent with patient to include physician time:45 minutes  discussing issues with family and patient including CODE STATUS and doing stat discharge summary. SignedCorky Crafts. 11/17/2011, 3:13 PM

## 2011-11-17 NOTE — Progress Notes (Signed)
Physical Therapy Treatment Patient Details Name: KENYEN CANDY MRN: [id number] DOB: May 31, 1915 Today's Date: 11/17/2011 Time: 7829-5621 PT Time Calculation (min): 27 min  PT Assessment / Plan / Recommendation Comments on Treatment Session  On first attempt, pt had just bathed and gotten up to chair and too dyspneic. Returned one hour later and pt requesting to have his oxy IR medicine prior to PT due to dyspnea. RN provided meds and returned in 20 minutes and pt eager to work.     Follow Up Recommendations  Skilled nursing facility;Supervision/Assistance - 24 hour    Barriers to Discharge        Equipment Recommendations  Defer to next venue    Recommendations for Other Services    Frequency Min 3X/week   Plan Discharge plan remains appropriate;Frequency remains appropriate    Precautions / Restrictions Precautions Precautions: Fall;Other (comment) (monitor SaO2)   Pertinent Vitals/Pain On 2L O2 during ambulation; SaO2 96% with seated rest after 40 ft    Mobility  Transfers Transfers: Sit to Stand;Stand to Sit Sit to Stand: 5: Supervision;With upper extremity assist;From chair/3-in-1 (x 2) Stand to Sit: 5: Supervision;With upper extremity assist;With armrests;To chair/3-in-1 (x 2) Details for Transfer Assistance: pt required vc for safe use of RW/hand placement x 1 and then able to recall  Ambulation/Gait Ambulation/Gait Assistance: 5: Supervision;Other (comment) (close; assist with O2 tubing) Ambulation Distance (Feet): 80 Feet (40 ft x2 seated rest; in room maneuvering around obstacles) Assistive device: Rolling walker Ambulation/Gait Assistance Details: Pt continues with flexed trunk; doing better in close spaces keeping RW closer to him (although required occasional reminder cues); pt unable to keep O2 tubing out of his way without assistance; did better on second walk thru room, but still needed cues and assist with tubing; no unsteadiness Gait Pattern: Trunk  flexed;Shuffle Gait velocity: decreased    Exercises     PT Diagnosis:    PT Problem List:   PT Treatment Interventions:     PT Goals Acute Rehab PT Goals PT Goal: Sit to Stand - Progress: Progressing toward goal PT Goal: Ambulate - Progress: Progressing toward goal  Visit Information  Last PT Received On: 11/17/11 Assistance Needed: +1    Subjective Data  Subjective: "I might be going to [address]  When asked what he needs to be able to do to feel comfortable going home, "I need to be up more and able to do for myself" OT Consult now pending.   Cognition       Balance     End of Session PT - End of Session Equipment Utilized During Treatment: Oxygen (O2 extension to simulate home environment) Activity Tolerance: Patient limited by fatigue;Other (comment) (limited by dyspnea) Patient left: in chair;with call bell/phone within reach;with family/visitor present Nurse Communication: Mobility status;Other (comment) (allowing pt to manipulate o2 tubing when walking in room)    Lynnette Pote 11/17/2011, 11:56 AM Pager [phone]

## 2011-11-17 NOTE — Progress Notes (Signed)
SUBJECTIVE:   No chest discomfort.  Some shortness of breath.  He walked today using oxygen.  Some intermittent confusion with Ativan and with ambien in the past. Eating better.  Tachycardia improved with amiodarone.  Constipation.   OBJECTIVE:   Vitals:   Filed Vitals:   11/17/11 0400 11/17/11 0730 11/17/11 0732 11/17/11 1013  BP: 116/67 111/75 111/75 102/88  Pulse: 73 89 89 79  Temp: 97.7 F (36.5 C) 97.7 F (36.5 C)    TempSrc: Oral Oral    Resp: 16 17    Height:      Weight:      SpO2: 98% 100% 96%    I&O's:    Intake/Output Summary (Last 24 hours) at 11/17/11 1159 Last data filed at 11/17/11 0900  Gross per 24 hour  Intake    710 ml  Output    925 ml  Net   -215 ml   TELEMETRY: Reviewed telemetry pt previously in NS, intermittent atrial flutter, pause > 3 seconds, presumably during sleep at 4AM on 5/21:;NSR, sinus tachycardia     PHYSICAL EXAM General: Well developed, well nourished, in no acute distress Head:    Normal cephalic and atramatic  Lungs:  Bibasilar crackles Heart:   Tachycardic S1 S2, 2/6 SEM Abdomen:  abdomen soft and non-tender without masses or                  Hernia's noted. Msk:  Back normal, normal gait. Normal strength and tone for age. Extremities:   tr edema; right hand erythema Neuro: Alert and oriented X 3. Psych: Flat affect, responds appropriately   LABS: Basic Metabolic Panel:  Basename 11/17/11 0544  NA 141  K 3.9  CL 101  CO2 28  GLUCOSE 105*  BUN 30*  CREATININE 0.95  CALCIUM 9.6  MG --  PHOS --   Liver Function Tests: No results found for this basename: AST:2,ALT:2,ALKPHOS:2,BILITOT:2,PROT:2,ALBUMIN:2 in the last 72 hours No results found for this basename: LIPASE:2,AMYLASE:2 in the last 72 hours CBC:  Basename 11/15/11 1008  WBC 15.7*  NEUTROABS --  HGB 11.5*  HCT 35.2*  MCV 97.0  PLT 346   Cardiac Enzymes: No results found for this basename: CKTOTAL:3,CKMB:3,CKMBINDEX:3,TROPONINI:3 in the last 72  hours BNP: No components found with this basename: POCBNP:3 D-Dimer: No results found for this basename: DDIMER:2 in the last 72 hours Hemoglobin A1C: No results found for this basename: HGBA1C in the last 72 hours Fasting Lipid Panel: No results found for this basename: CHOL,HDL,LDLCALC,TRIG,CHOLHDL,LDLDIRECT in the last 72 hours Thyroid Function Tests: No results found for this basename: TSH,T4TOTAL,FREET3,T3FREE,THYROIDAB in the last 72 hours Anemia Panel: No results found for this basename: VITAMINB12,FOLATE,FERRITIN,TIBC,IRON,RETICCTPCT in the last 72 hours Coag Panel:   Lab Results  Component Value Date   INR 0.98 11/05/2011   INR 1.12 06/12/2010    RADIOLOGY: No results found.    ASSESSMENT: Late presenting acute inferior MI.  Medical therapy.  PLAN:  1) Inferior MI.  Continue beta blocker, aspirin, statin.  No recent chest pain off of heparin.  Initially, patient was to go home with HHPT.  However, this will be too much for wife.  Overqualified for inpatient rehab.  Family looking at other places.  Main issue now is placement.  2) Some SHOB. atrial tachycardia improved with amiodarone.  Lasix daily. Watch Cr.  BUN slightly increased. Replace K.  He will need oxygen while walking.  Family is agreeable.  Palliative care changed liquid morphine to oxycodone IR.  3)   He wishes to be DNR/DNI and filled out a living will in the past.  Dr. Ladona Ridgel consulted from palliative care.  Will also get PT to work with patient today.  Encouraged PO intake.  Family is very supportive of keeping him comfortable here and at home.  4) Increased WBC.  May be related to recent MI vs. UTI.  Rocephin started by palliative care. Should cover skin organisms affecting right hand.  Would defer to internal medicine as to which oral antibiotic would be used for covering UTI if he is able to be discharged, and how long treatment needs to be.   5) Using condom cath due to issues with incontinence in setting  of lasix and being confined to bed.  Corky Crafts., MD  11/17/2011  11:59 AM

## 2011-11-17 NOTE — Progress Notes (Signed)
Patient ZO:XWRUEA F Califano      DOB: May 30, 1915      VWU:[id number]  Stopped into see patient, wife and step daughter present. Plans are for discharge to [facility] today per wife. Informed her that the liquid Morphine prescribed during hospitalizations will be changed to Oxycodone tabs 5 mg, and that Dr Jim Like is to provide a prescription upon discharge.  Freddie Breech, CNS-C Palliative Medicine Team [facility] Team Phone: [phone] Pager: [phone]

## 2011-11-17 NOTE — Progress Notes (Signed)
Clinical Social Worker met with patient and family to relay bed offers received. Family is going to [facility] as they are very interested in this facility. Family will contact CSW on final decision for SNF placement. CSW will continue to follow.   Rozetta Nunnery MSW, [employer] [phone]

## 2011-11-25 NOTE — Consult Note (Signed)
Agree with above 

## 2012-03-08 ENCOUNTER — Other Ambulatory Visit: Payer: Self-pay | Admitting: Urology

## 2012-03-22 ENCOUNTER — Encounter (HOSPITAL_COMMUNITY): Payer: Self-pay | Admitting: Pharmacy Technician

## 2012-03-27 ENCOUNTER — Encounter (HOSPITAL_COMMUNITY): Payer: Self-pay

## 2012-03-27 ENCOUNTER — Ambulatory Visit (HOSPITAL_COMMUNITY)
Admission: RE | Admit: 2012-03-27 | Discharge: 2012-03-27 | Disposition: A | Discharge status: Home / Self Care | Payer: MEDICARE | Source: Ambulatory Visit | Attending: Urology | Admitting: Urology

## 2012-03-27 ENCOUNTER — Encounter (HOSPITAL_COMMUNITY)
Admission: RE | Admit: 2012-03-27 | Discharge: 2012-03-27 | Disposition: A | Discharge status: Home / Self Care | Payer: MEDICARE | Source: Ambulatory Visit | Attending: Urology | Admitting: Urology

## 2012-03-27 DIAGNOSIS — I517 Cardiomegaly: Secondary | ICD-10-CM | POA: Insufficient documentation

## 2012-03-27 DIAGNOSIS — Z01812 Encounter for preprocedural laboratory examination: Secondary | ICD-10-CM | POA: Insufficient documentation

## 2012-03-27 DIAGNOSIS — I252 Old myocardial infarction: Secondary | ICD-10-CM | POA: Insufficient documentation

## 2012-03-27 DIAGNOSIS — Z951 Presence of aortocoronary bypass graft: Secondary | ICD-10-CM | POA: Insufficient documentation

## 2012-03-27 DIAGNOSIS — N201 Calculus of ureter: Secondary | ICD-10-CM | POA: Insufficient documentation

## 2012-03-27 HISTORY — DX: Anxiety disorder, unspecified: F41.9

## 2012-03-27 HISTORY — DX: Chronic kidney disease, unspecified: N18.9

## 2012-03-27 HISTORY — DX: Malignant (primary) neoplasm, unspecified: C80.1

## 2012-03-27 HISTORY — DX: Pneumonia, unspecified organism: J18.9

## 2012-03-27 HISTORY — DX: Gastro-esophageal reflux disease without esophagitis: K21.9

## 2012-03-27 LAB — CBC
HCT: 41.8 % (ref 39.0–52.0)
MCH: 30.4 pg (ref 26.0–34.0)
MCHC: 32.5 g/dL (ref 30.0–36.0)
RDW: 14.8 % (ref 11.5–15.5)

## 2012-03-27 LAB — BASIC METABOLIC PANEL
BUN: 30 mg/dL — ABNORMAL HIGH (ref 6–23)
Calcium: 11 mg/dL — ABNORMAL HIGH (ref 8.4–10.5)
GFR calc Af Amer: 52 mL/min — ABNORMAL LOW (ref >90)
GFR calc non Af Amer: 45 mL/min — ABNORMAL LOW (ref >90)
Potassium: 4.6 mEq/L (ref 3.5–5.1)
Sodium: 140 mEq/L (ref 135–145)

## 2012-03-27 LAB — SURGICAL PCR SCREEN
MRSA, PCR: NEGATIVE
Staphylococcus aureus: NEGATIVE

## 2012-03-27 NOTE — Progress Notes (Signed)
11/08/11 ECHO in EPIC  EKG 5/13 on chart  Last office visit with Dr Eldridge Dace 03/15/12 on chart

## 2012-03-27 NOTE — Patient Instructions (Signed)
20 Shaun [name]  03/27/2012   Your procedure is scheduled on:  03/29/12 300pm-430pm  Report to [facility] at 1230pm  Call this number if you have problems the morning of surgery: [phone]   Remember:   Do not eat food:After Midnight.  May have clear liquids:until 0830am then npo .  Clear liquids include soda, tea, black coffee, apple or grape juice, broth.  Take these medicines the morning of surgery with A SIP OF WATER:    Do not wear jewelry,   Do not wear lotions, powders, or perfumes. .   Men may shave face and neck.  Do not bring valuables to the hospital.  Contacts, dentures or bridgework may not be worn into surgery.      Patients discharged the day of surgery will not be allowed to drive home.  Name and phone number of your driver:             SEE CHG INSTRUCTION SHEET    Please read over the following fact sheets that you were given: MRSA Information, coughing and deep breathing exercises, leg exercises

## 2012-03-27 NOTE — Progress Notes (Signed)
Abnoramal BMET result faxed to Dr Margarita Grizzle via EPIC.

## 2012-03-29 ENCOUNTER — Encounter (HOSPITAL_COMMUNITY): Payer: Self-pay | Admitting: Anesthesiology

## 2012-03-29 ENCOUNTER — Encounter (HOSPITAL_COMMUNITY): Payer: Self-pay | Admitting: *Deleted

## 2012-03-29 ENCOUNTER — Ambulatory Visit (HOSPITAL_COMMUNITY): Payer: MEDICARE | Admitting: Anesthesiology

## 2012-03-29 ENCOUNTER — Ambulatory Visit (HOSPITAL_COMMUNITY): Payer: MEDICARE

## 2012-03-29 ENCOUNTER — Inpatient Hospital Stay (HOSPITAL_COMMUNITY)
Admission: RE | Admit: 2012-03-29 | Discharge: 2012-03-31 | DRG: 988 | Disposition: A | Discharge status: Home / Self Care | Payer: MEDICARE | Source: Ambulatory Visit | Attending: Urology | Admitting: Urology

## 2012-03-29 ENCOUNTER — Encounter (HOSPITAL_COMMUNITY)
Admission: RE | Disposition: A | Discharge status: Home / Self Care | Payer: Self-pay | Source: Ambulatory Visit | Attending: Urology

## 2012-03-29 DIAGNOSIS — I4891 Unspecified atrial fibrillation: Secondary | ICD-10-CM | POA: Diagnosis present

## 2012-03-29 DIAGNOSIS — F411 Generalized anxiety disorder: Secondary | ICD-10-CM | POA: Diagnosis present

## 2012-03-29 DIAGNOSIS — K219 Gastro-esophageal reflux disease without esophagitis: Secondary | ICD-10-CM | POA: Diagnosis present

## 2012-03-29 DIAGNOSIS — I509 Heart failure, unspecified: Secondary | ICD-10-CM | POA: Diagnosis present

## 2012-03-29 DIAGNOSIS — I5021 Acute systolic (congestive) heart failure: Secondary | ICD-10-CM | POA: Diagnosis present

## 2012-03-29 DIAGNOSIS — Z951 Presence of aortocoronary bypass graft: Secondary | ICD-10-CM

## 2012-03-29 DIAGNOSIS — I129 Hypertensive chronic kidney disease with stage 1 through stage 4 chronic kidney disease, or unspecified chronic kidney disease: Secondary | ICD-10-CM | POA: Diagnosis present

## 2012-03-29 DIAGNOSIS — N189 Chronic kidney disease, unspecified: Secondary | ICD-10-CM | POA: Diagnosis present

## 2012-03-29 DIAGNOSIS — J811 Chronic pulmonary edema: Secondary | ICD-10-CM

## 2012-03-29 DIAGNOSIS — Z823 Family history of stroke: Secondary | ICD-10-CM

## 2012-03-29 DIAGNOSIS — Z8249 Family history of ischemic heart disease and other diseases of the circulatory system: Secondary | ICD-10-CM

## 2012-03-29 DIAGNOSIS — I359 Nonrheumatic aortic valve disorder, unspecified: Secondary | ICD-10-CM | POA: Diagnosis present

## 2012-03-29 DIAGNOSIS — I2510 Atherosclerotic heart disease of native coronary artery without angina pectoris: Secondary | ICD-10-CM | POA: Diagnosis present

## 2012-03-29 DIAGNOSIS — N201 Calculus of ureter: Secondary | ICD-10-CM

## 2012-03-29 DIAGNOSIS — I350 Nonrheumatic aortic (valve) stenosis: Secondary | ICD-10-CM

## 2012-03-29 DIAGNOSIS — I5023 Acute on chronic systolic (congestive) heart failure: Principal | ICD-10-CM | POA: Diagnosis present

## 2012-03-29 HISTORY — PX: CYSTOSCOPY WITH URETEROSCOPY: SHX5123

## 2012-03-29 SURGERY — CYSTOSCOPY WITH URETEROSCOPY
Anesthesia: General | Laterality: Left

## 2012-03-29 MED ORDER — BELLADONNA ALKALOIDS-OPIUM 16.2-60 MG RE SUPP
RECTAL | Status: DC | PRN
  Administered 2012-03-29: 1 via RECTAL

## 2012-03-29 MED ORDER — SENNOSIDES-DOCUSATE SODIUM 8.6-50 MG PO TABS
1.0000 | ORAL_TABLET | Freq: Two times a day (BID) | ORAL | Status: DC
  Administered 2012-03-30 – 2012-03-31 (×3): 1 via ORAL
  Filled 2012-03-29 (×5): qty 1

## 2012-03-29 MED ORDER — ASPIRIN EC 81 MG PO TBEC
81.0000 mg | DELAYED_RELEASE_TABLET | Freq: Every day | ORAL | Status: DC
  Administered 2012-03-30 – 2012-03-31 (×2): 81 mg via ORAL
  Filled 2012-03-29 (×3): qty 1

## 2012-03-29 MED ORDER — CEFAZOLIN SODIUM-DEXTROSE 2-3 GM-% IV SOLR
2.0000 g | INTRAVENOUS | Status: AC
  Administered 2012-03-29: 2 g via INTRAVENOUS

## 2012-03-29 MED ORDER — LACTATED RINGERS IV SOLN: INTRAVENOUS | Status: DC

## 2012-03-29 MED ORDER — FENTANYL CITRATE 0.05 MG/ML IJ SOLN
25.0000 ug | INTRAMUSCULAR | Status: AC | PRN
  Administered 2012-03-29 (×2): 25 ug via INTRAVENOUS
  Administered 2012-03-29 (×2): 12.5 ug via INTRAVENOUS
  Administered 2012-03-29 (×2): 25 ug via INTRAVENOUS

## 2012-03-29 MED ORDER — FUROSEMIDE 10 MG/ML IJ SOLN
40.0000 mg | Freq: Once | INTRAMUSCULAR | Status: AC
  Administered 2012-03-29: 40 mg via INTRAVENOUS

## 2012-03-29 MED ORDER — FUROSEMIDE 10 MG/ML IJ SOLN
INTRAMUSCULAR | Status: AC
Start: 2012-03-29 — End: 2012-03-30
  Filled 2012-03-29: qty 2

## 2012-03-29 MED ORDER — FUROSEMIDE 40 MG PO TABS: 40.0000 mg | ORAL_TABLET | Freq: Every morning | ORAL | Status: DC

## 2012-03-29 MED ORDER — LIDOCAINE HCL 2 % EX GEL
CUTANEOUS | Status: DC | PRN
  Administered 2012-03-29: 1 via URETHRAL

## 2012-03-29 MED ORDER — BELLADONNA ALKALOIDS-OPIUM 16.2-60 MG RE SUPP: 1.0000 | Freq: Four times a day (QID) | RECTAL | Status: DC | PRN

## 2012-03-29 MED ORDER — BACITRACIN-NEOMYCIN-POLYMYXIN 400-5-5000 EX OINT: 1.0000 "application " | TOPICAL_OINTMENT | Freq: Three times a day (TID) | CUTANEOUS | Status: DC | PRN

## 2012-03-29 MED ORDER — SODIUM CHLORIDE 0.9 % IV SOLN
INTRAVENOUS | Status: DC
  Administered 2012-03-29: 1000 mL via INTRAVENOUS

## 2012-03-29 MED ORDER — FUROSEMIDE 10 MG/ML IJ SOLN
INTRAMUSCULAR | Status: AC
  Filled 2012-03-29: qty 2

## 2012-03-29 MED ORDER — ISOSORBIDE MONONITRATE ER 60 MG PO TB24
60.0000 mg | ORAL_TABLET | Freq: Every day | ORAL | Status: DC
  Administered 2012-03-30: 60 mg via ORAL
  Filled 2012-03-29 (×3): qty 1

## 2012-03-29 MED ORDER — NITROGLYCERIN IN D5W 200-5 MCG/ML-% IV SOLN
2.0000 ug/min | INTRAVENOUS | Status: DC
  Administered 2012-03-29: 10 ug/min via INTRAVENOUS

## 2012-03-29 MED ORDER — PHENYLEPHRINE HCL 10 MG/ML IJ SOLN
INTRAMUSCULAR | Status: DC | PRN
  Administered 2012-03-29: 40 ug via INTRAVENOUS

## 2012-03-29 MED ORDER — FENTANYL CITRATE 0.05 MG/ML IJ SOLN
INTRAMUSCULAR | Status: AC
  Filled 2012-03-29: qty 2

## 2012-03-29 MED ORDER — MORPHINE SULFATE 2 MG/ML IJ SOLN
2.0000 mg | INTRAMUSCULAR | Status: DC | PRN
Start: 2012-03-29 — End: 2012-03-31

## 2012-03-29 MED ORDER — NITROGLYCERIN IN D5W 200-5 MCG/ML-% IV SOLN: 2.0000 ug/min | INTRAVENOUS | Status: DC

## 2012-03-29 MED ORDER — FUROSEMIDE 10 MG/ML IJ SOLN
INTRAMUSCULAR | Status: AC
  Filled 2012-03-29: qty 4

## 2012-03-29 MED ORDER — FUROSEMIDE 10 MG/ML IJ SOLN
20.0000 mg | Freq: Once | INTRAMUSCULAR | Status: AC
  Administered 2012-03-29: 20 mg via INTRAVENOUS

## 2012-03-29 MED ORDER — SODIUM CHLORIDE 0.9 % IJ SOLN: 3.0000 mL | INTRAMUSCULAR | Status: DC | PRN

## 2012-03-29 MED ORDER — SODIUM CHLORIDE 0.9 % IV SOLN: 250.0000 mL | INTRAVENOUS | Status: DC

## 2012-03-29 MED ORDER — DOCUSATE SODIUM 100 MG PO CAPS
100.0000 mg | ORAL_CAPSULE | Freq: Every day | ORAL | Status: DC
  Administered 2012-03-30 – 2012-03-31 (×2): 100 mg via ORAL
  Filled 2012-03-29 (×2): qty 1

## 2012-03-29 MED ORDER — SUCCINYLCHOLINE CHLORIDE 20 MG/ML IJ SOLN
INTRAMUSCULAR | Status: DC | PRN
  Administered 2012-03-29: 80 mg via INTRAVENOUS

## 2012-03-29 MED ORDER — OXYCODONE HCL 5 MG PO TABS: 5.0000 mg | ORAL_TABLET | ORAL | Status: DC | PRN

## 2012-03-29 MED ORDER — FUROSEMIDE 10 MG/ML IJ SOLN
40.0000 mg | Freq: Every day | INTRAMUSCULAR | Status: DC
  Administered 2012-03-30: 40 mg via INTRAVENOUS
  Filled 2012-03-29 (×2): qty 4

## 2012-03-29 MED ORDER — ONDANSETRON HCL 4 MG/2ML IJ SOLN
INTRAMUSCULAR | Status: DC | PRN
  Administered 2012-03-29: 4 mg via INTRAVENOUS

## 2012-03-29 MED ORDER — SODIUM CHLORIDE 0.9 % IJ SOLN
3.0000 mL | Freq: Two times a day (BID) | INTRAMUSCULAR | Status: DC
  Administered 2012-03-30 (×2): 3 mL via INTRAVENOUS

## 2012-03-29 MED ORDER — LORATADINE 10 MG PO TABS
10.0000 mg | ORAL_TABLET | Freq: Every day | ORAL | Status: DC | PRN
  Filled 2012-03-29: qty 1

## 2012-03-29 MED ORDER — CIPROFLOXACIN HCL 250 MG PO TABS
250.0000 mg | ORAL_TABLET | Freq: Two times a day (BID) | ORAL | Status: AC
  Administered 2012-03-29: 250 mg via ORAL
  Filled 2012-03-29 (×2): qty 1

## 2012-03-29 MED ORDER — PANTOPRAZOLE SODIUM 40 MG PO TBEC
40.0000 mg | DELAYED_RELEASE_TABLET | Freq: Every day | ORAL | Status: DC
  Administered 2012-03-30 – 2012-03-31 (×2): 40 mg via ORAL
  Filled 2012-03-29 (×2): qty 1

## 2012-03-29 MED ORDER — ALPRAZOLAM 0.25 MG PO TABS: 0.1200 mg | ORAL_TABLET | Freq: Every evening | ORAL | Status: DC | PRN

## 2012-03-29 MED ORDER — DOCUSATE SODIUM 100 MG PO CAPS
200.0000 mg | ORAL_CAPSULE | Freq: Every day | ORAL | Status: DC
  Administered 2012-03-30: 200 mg via ORAL
  Filled 2012-03-29 (×3): qty 2

## 2012-03-29 MED ORDER — CEFAZOLIN SODIUM-DEXTROSE 2-3 GM-% IV SOLR
INTRAVENOUS | Status: AC
  Filled 2012-03-29: qty 50

## 2012-03-29 MED ORDER — FENTANYL CITRATE 0.05 MG/ML IJ SOLN
INTRAMUSCULAR | Status: DC | PRN
Start: 2012-03-29 — End: 2012-03-29
  Administered 2012-03-29: 12.5 ug via INTRAVENOUS

## 2012-03-29 MED ORDER — AMIODARONE HCL 200 MG PO TABS
200.0000 mg | ORAL_TABLET | Freq: Every morning | ORAL | Status: DC
  Administered 2012-03-30 – 2012-03-31 (×2): 200 mg via ORAL
  Filled 2012-03-29 (×2): qty 1

## 2012-03-29 MED ORDER — ONDANSETRON HCL 4 MG/2ML IJ SOLN: 4.0000 mg | INTRAMUSCULAR | Status: DC | PRN

## 2012-03-29 MED ORDER — NITROGLYCERIN 0.4 MG SL SUBL: 0.4000 mg | SUBLINGUAL_TABLET | SUBLINGUAL | Status: DC | PRN

## 2012-03-29 MED ORDER — ACETAMINOPHEN 325 MG PO TABS: 650.0000 mg | ORAL_TABLET | ORAL | Status: DC | PRN

## 2012-03-29 MED ORDER — DOCUSATE SODIUM 100 MG PO CAPS: 100.0000 mg | ORAL_CAPSULE | Freq: Two times a day (BID) | ORAL | Status: DC

## 2012-03-29 MED ORDER — LACTATED RINGERS IV SOLN
INTRAVENOUS | Status: DC | PRN
  Administered 2012-03-29: 13:00:00 via INTRAVENOUS

## 2012-03-29 MED ORDER — POTASSIUM CHLORIDE CRYS ER 10 MEQ PO TBCR
10.0000 meq | EXTENDED_RELEASE_TABLET | Freq: Two times a day (BID) | ORAL | Status: DC
  Administered 2012-03-30 – 2012-03-31 (×3): 10 meq via ORAL
  Filled 2012-03-29 (×5): qty 1

## 2012-03-29 MED ORDER — ATORVASTATIN CALCIUM 40 MG PO TABS
40.0000 mg | ORAL_TABLET | Freq: Every day | ORAL | Status: DC
  Administered 2012-03-30: 40 mg via ORAL
  Filled 2012-03-29 (×3): qty 1

## 2012-03-29 MED ORDER — NITROGLYCERIN IN D5W 200-5 MCG/ML-% IV SOLN
INTRAVENOUS | Status: AC
  Filled 2012-03-29: qty 250

## 2012-03-29 MED ORDER — PROPOFOL 10 MG/ML IV BOLUS
INTRAVENOUS | Status: DC | PRN
  Administered 2012-03-29: 100 mg via INTRAVENOUS

## 2012-03-29 MED ORDER — ACETAMINOPHEN 10 MG/ML IV SOLN
1000.0000 mg | Freq: Four times a day (QID) | INTRAVENOUS | Status: AC
  Administered 2012-03-29 – 2012-03-30 (×4): 1000 mg via INTRAVENOUS
  Filled 2012-03-29 (×4): qty 100

## 2012-03-29 MED ORDER — SODIUM CHLORIDE 0.9 % IR SOLN
Status: DC | PRN
  Administered 2012-03-29: 4000 mL via INTRAVESICAL

## 2012-03-29 SURGICAL SUPPLY — 26 items
ADAPTER CATH URET PLST 4-6FR (CATHETERS) ×1 IMPLANT
ADPR CATH URET STRL DISP 4-6FR (CATHETERS)
BAG URO CATCHER STRL LF (DRAPE) ×2 IMPLANT
BASKET ZERO TIP NITINOL 2.4FR (BASKET) ×1 IMPLANT
BSKT STON RTRVL ZERO TP 2.4FR (BASKET) ×1
CATH INTERMIT  6FR 70CM (CATHETERS) ×1 IMPLANT
CATH URET 5FR 28IN OPEN ENDED (CATHETERS) ×2 IMPLANT
CLOTH BEACON ORANGE TIMEOUT ST (SAFETY) ×2 IMPLANT
DRAPE CAMERA CLOSED 9X96 (DRAPES) ×2 IMPLANT
GLOVE BIOGEL M 7.0 STRL (GLOVE) ×2 IMPLANT
GLOVE BIOGEL PI IND STRL 7.0 (GLOVE) ×1 IMPLANT
GLOVE BIOGEL PI INDICATOR 7.0 (GLOVE) ×1
GLOVE SURG SS PI 8.0 STRL IVOR (GLOVE) ×2 IMPLANT
GOWN PREVENTION PLUS XLARGE (GOWN DISPOSABLE) ×2 IMPLANT
GOWN STRL NON-REIN LRG LVL3 (GOWN DISPOSABLE) ×2 IMPLANT
GOWN STRL REIN XL XLG (GOWN DISPOSABLE) ×2 IMPLANT
GUIDEWIRE ANG ZIPWIRE 038X150 (WIRE) IMPLANT
GUIDEWIRE STR DUAL SENSOR (WIRE) ×2 IMPLANT
MANIFOLD NEPTUNE II (INSTRUMENTS) ×2 IMPLANT
MARKER SKIN DUAL TIP RULER LAB (MISCELLANEOUS) ×2 IMPLANT
PACK CYSTO (CUSTOM PROCEDURE TRAY) ×2 IMPLANT
SCRUB PCMX 4 OZ (MISCELLANEOUS) ×2 IMPLANT
SHEATH ACCESS URETERAL 38CM (SHEATH) ×1 IMPLANT
STENT CONTOUR 6FRX24X.038 (STENTS) ×1 IMPLANT
TUBING CONNECTING 10 (TUBING) ×2 IMPLANT
WIRE COONS/BENSON .038X145CM (WIRE) ×1 IMPLANT

## 2012-03-29 NOTE — Transfer of Care (Signed)
Immediate Anesthesia Transfer of Care Note  Patient: Shaun [name]  Procedure(s) Performed: Procedure(s) (LRB): CYSTOSCOPY WITH URETEROSCOPY (Left) CYSTOSCOPY WITH STENT PLACEMENT (Left)  Patient Location: PACU  Anesthesia Type: General  Level of Consciousness: sedated, patient cooperative and responds to stimulaton  Airway & Oxygen Therapy: Patient Spontanous Breathing and Patient connected to face mask oxgen  Post-op Assessment: Report given to PACU RN and Post -op Vital signs reviewed and stable  Post vital signs: Reviewed and stable  Complications: No apparent anesthesia complications

## 2012-03-29 NOTE — H&P (Signed)
Urology History and Physical Exam  CC: Left ureter stone  HPI: 78 year old male presents today for treatment of the left distal ureter stone. This was discovered 02/10/12 by CT scan. This stone was located at the ureterovesical junction. It was 2 mm in size. It has not been associated with gross hematuria.  Follow up CT showed the stone had not progressed since his last CT scan when he was reevaluated in clinic. We have discussed management options including continued observation versus intervention with ureteroscopy. We discussed the risks, benefits, alternatives, and likelihood of achieving goals. He was cleared by Dr. Eldridge Dace for surgery and be off the Plavix before the surgery. He is a moderate risk for cardiac event during the surgical procedure, and there is no way to lower his risk.  Judicious use of IV fluids to avoid heart failure symptoms due to moderate aortic stenosis was recommended.  Ok to hold plavix for 5-7 days before surgery and restart when appropriate. Urinalysis 03/03/12 negative for signs of infection.   PMH: Past Medical History  Diagnosis Date  . Hypertension   . CAD (coronary artery disease)   . Osteoarthritis   . DDD (degenerative disc disease)   . Spinal stenosis   . Heart murmur   . Shortness of breath   . Myocardial infarct     11/05/11 , 1999  . Anxiety   . Pneumonia     hx of double pneumonia   . GERD (gastroesophageal reflux disease)   . Chronic kidney disease     hxof multiple kidney stones   . Cancer     hx of skin cancery     PSH: Past Surgical History  Procedure Date  . Tonsillectomy   . Coronary artery bypass graft     1999  . Lithotripsy   . Hernia repair     bilateral 1947   . Cardiac catheterization   . Nasal septum surgery     Allergies: Allergies  Allergen Reactions  . Cocaine     Medications: No prescriptions prior to admission     Social History: History   Social History  . Marital Status: Married    Spouse Name: N/A     Number of Children: N/A  . Years of Education: N/A   Occupational History  . Not on file.   Social History Main Topics  . Smoking status: Never Smoker   . Smokeless tobacco: Never Used  . Alcohol Use: No  . Drug Use: No  . Sexually Active: Not Currently   Other Topics Concern  . Not on file   Social History Narrative  . No narrative on file    Family History: Family History  Problem Relation Age of Onset  . Cancer Father   . Coronary artery disease Father   . Stroke Mother     Review of Systems: Positive: None. Negative: Chest pain, SOB, fever.  A further 10 point review of systems was negative except what is listed in the HPI.  Physical Exam: Filed Vitals:   03/29/12 1221  BP: 133/92  Pulse: 100  Temp: 98.1 F (36.7 C)  Resp: 20    General: No acute distress.  Awake. Head:  Normocephalic.  Atraumatic. ENT:  EOMI.  Mucous membranes moist Neck:  Supple.  No lymphadenopathy. Pulmonary: Equal effort bilaterally.  Clear to auscultation bilaterally. Abdomen: Soft.  Non- tender to palpation. Skin:  Normal turgor.  No visible rash. Extremity: No gross deformity of bilateral upper extremities.  No gross deformity  of    bilateral lower extremities. Neurologic: Alert. Appropriate mood.    Studies:  Recent Labs  Basename 03/27/12 1130   HGB 13.6   WBC 8.6   PLT 255    Recent Labs  Basename 03/27/12 1130   NA 140   K 4.6   CL 99   CO2 28   BUN 30*   CREATININE 1.31   CALCIUM 11.0*   GFRNONAA 45*   GFRAA 52*     No results found for this basename: PT:2,INR:2,APTT:2 in the last 72 hours   No components found with this basename: ABG:2    Assessment:  Left ureter stone  Plan: To OR for cystoscopy, left ureteroscopy, possible laser lithotripsy, possible ureter stent on the left.

## 2012-03-29 NOTE — Consult Note (Signed)
Admit date: 03/29/2012 Referring Physician  Dr. Margarita Grizzle Primary Physician  Dr. Pete Glatter Primary Cardiologist  Dr. Eldridge Dace Reason for Consultation  shortness of breath  HPI: 78 year old man with known coronary artery disease.  He has moderate to severe aortic stenosis.  He underwent urologic surgery today.  Postoperatively, he was noted to have increased work of breathing.  Chest x-ray revealed pulmonary edema.  I was called about the patient at that time by the anesthesiologist.  It was reported to me that his respiratory rate was 35 per minute and his blood pressure was 160 systolic.  I instructed them to give an additional 40 mg of Lasix intravenously.  I also asked the nitroglycerin IV be started to help lower blood pressure and reduce the pulmonary edema.  The patient was then transferred to the step down unit.  Currently, the patient is breathing much easier.  He has put out about 700 cc of urine since the surgery.  Overall, he feels better.  He denies any chest pain.  He does not have any pain from his surgery today.     PMH:   Past Medical History  Diagnosis Date  . Hypertension   . CAD (coronary artery disease)   . Osteoarthritis   . DDD (degenerative disc disease)   . Spinal stenosis   . Heart murmur   . Shortness of breath   . Myocardial infarct     11/05/11 , 1999  . Anxiety   . Pneumonia     hx of double pneumonia   . GERD (gastroesophageal reflux disease)   . Chronic kidney disease     hxof multiple kidney stones   . Cancer     hx of skin cancery      PSH:   Past Surgical History  Procedure Date  . Tonsillectomy   . Coronary artery bypass graft     1999  . Lithotripsy   . Hernia repair     bilateral 1947   . Cardiac catheterization   . Nasal septum surgery     Allergies:  Cocaine Prior to Admit Meds:   Prescriptions prior to admission  Medication Sig Dispense Refill  . acetaminophen (TYLENOL) 325 MG tablet Take 2 tablets (650 mg total) by mouth every 4  (four) hours as needed.      . ALPRAZolam (XANAX) 0.25 MG tablet Take 0.12-0.25 mg by mouth at bedtime as needed.      Marland Kitchen amiodarone (PACERONE) 200 MG tablet Take 200 mg by mouth every morning.      Marland Kitchen aspirin EC 81 MG tablet Take 81 mg by mouth daily.      Marland Kitchen atorvastatin (LIPITOR) 40 MG tablet Take 40 mg by mouth at bedtime.      . beta carotene w/minerals (OCUVITE) tablet Take 1 tablet by mouth daily.       Marland Kitchen docusate sodium (COLACE) 100 MG capsule Take 100-200 mg by mouth 2 (two) times daily. Take one tablet in the morning and two tablets in the evening.      . furosemide (LASIX) 40 MG tablet Take 40 mg by mouth every morning.      . isosorbide mononitrate (IMDUR) 60 MG 24 hr tablet Take 60 mg by mouth at bedtime.       . Misc Natural Products (OSTEO BI-FLEX ADV JOINT SHIELD) TABS Take 1 tablet by mouth 2 (two) times daily.      . Multiple Vitamins-Minerals (MULTIVITAMINS THER. W/MINERALS) TABS Take 1 tablet by mouth daily.       Marland Kitchen  omeprazole (PRILOSEC) 20 MG capsule Take 1 capsule (20 mg total) by mouth daily.      Marland Kitchen OVER THE COUNTER MEDICATION Herb-lax two in the am and 2 in pm      . potassium chloride (K-DUR,KLOR-CON) 10 MEQ tablet Take 10 mEq by mouth 2 (two) times daily.      . clopidogrel (PLAVIX) 75 MG tablet Take 1 tablet (75 mg total) by mouth daily with breakfast.      . loratadine (CLARITIN) 10 MG tablet Take 10 mg by mouth daily as needed. For allergies      . nitroGLYCERIN (NITROSTAT) 0.4 MG SL tablet Place 0.4 mg under the tongue every 5 (five) minutes as needed. For chest pain.       Fam HX:    Family History  Problem Relation Age of Onset  . Cancer Father   . Coronary artery disease Father   . Stroke Mother    Social HX:    History   Social History  . Marital Status: Married    Spouse Name: N/A    Number of Children: N/A  . Years of Education: N/A   Occupational History  . Not on file.   Social History Main Topics  . Smoking status: Never Smoker   . Smokeless  tobacco: Never Used  . Alcohol Use: No  . Drug Use: No  . Sexually Active: Not Currently   Other Topics Concern  . Not on file   Social History Narrative  . No narrative on file     ROS:  All 11 ROS were addressed and are negative except what is stated in the HPI  Physical Exam: Blood pressure 124/77, pulse 106, temperature 97.4 F (36.3 C), temperature source Oral, resp. rate 30, height 5' 6.14" (1.68 m), weight 68 kg (149 lb 14.6 oz), SpO2 92.00%.   General: Frail , in no acute distress Head:  Normal cephalic and atramatic  Lungs:Clear bilaterally to auscultation and percussion. Heart:  RRR S1 S2 3/6 systolic murmur Abdomen: Bowel sounds are positive, abdomen nondistended  Extremities:   No lower extremity edema.  DP +1 Neuro: Alert and oriented  Psych:  Normal affect, responds appropriately    Labs:   Lab Results  Component Value Date   WBC 8.6 03/27/2012   HGB 13.6 03/27/2012   HCT 41.8 03/27/2012   MCV 93.5 03/27/2012   PLT 255 03/27/2012    Lab 03/27/12 1130  NA 140  K 4.6  CL 99  CO2 28  BUN 30*  CREATININE 1.31  CALCIUM 11.0*  PROT --  BILITOT --  ALKPHOS --  ALT --  AST --  GLUCOSE 106*   No results found for this basename: PTT   Lab Results  Component Value Date   INR 0.98 11/05/2011   INR 1.12 06/12/2010   Lab Results  Component Value Date   CKTOTAL 320* 11/09/2011   CKMB 20.6* 11/09/2011   TROPONINI 9.25* 11/09/2011     Lab Results  Component Value Date   CHOL 160 11/06/2011   Lab Results  Component Value Date   HDL 49 11/06/2011   Lab Results  Component Value Date   LDLCALC 89 11/06/2011   Lab Results  Component Value Date   TRIG 111 11/06/2011   Lab Results  Component Value Date   CHOLHDL 3.3 11/06/2011   No results found for this basename: LDLDIRECT      Radiology:  Dg Chest Port 1 View  03/29/2012  *RADIOLOGY REPORT*  Clinical Data: Oxygen desaturation.  Chest congestion.  PORTABLE CHEST - 1 VIEW  Comparison: Chest x-ray  03/27/2012.  Findings: Significantly worsened aeration compared to the recent prior study with patchy multifocal interstitial and airspace disease most severe throughout the right mid and lower lung, with additional involvement at the left base.  Cephalization of the pulmonary vasculature, with generalized indistinctness of interstitial markings and some Kerley B lines throughout the periphery of the lungs bilaterally, indicative of at least some of this is related to interstitial pulmonary edema. Mild elevation of the right hemidiaphragm is unchanged.  Small right pleural effusion.  Heart size is mildly enlarged with prominence of the left ventricular contour. The patient is rotated to the right on today's exam, resulting in distortion of the mediastinal contours and reduced diagnostic sensitivity and specificity for mediastinal pathology.  Atherosclerotic calcifications within the arch of the aorta.  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  IMPRESSION: 1.  Interval development of mild interstitial pulmonary edema. 2.  In addition, however, there are more focal asymmetrically distributed interstitial and airspace opacities in a patchy distribution throughout the right mid and lower lung and left base, concerning for developing multilobar pneumonia. 3.  Cardiomegaly. 4.  Atherosclerosis.   Original Report Authenticated By: Florencia Reasons, M.D.     EKG:  Sinus tachycardia, intraventricular conduction delay, no significant ST segment changes  ASSESSMENT: Acute on chronic systolic heart failure, coronary artery disease, aortic stenosis  PLAN:  Heart failure symptoms are likely related to the fact that he did not take his diuretic this morning and then received about a liter of IV fluids during his procedure.  This all occurred in the setting of a decreased ejection fraction and aortic stenosis.  Continue IV Lasix for now.  Wean oxygen.  Continue to use IV nitroglycerin to keep blood pressure at  about 110-120 systolic.  The nitroglycerin will help relieve pulmonary edema as well.  Restart oral medications tomorrow.  If he is still significantly fluid overloaded, could give additional IV Lasix.  Depending on his respiratory status tomorrow, discharge may be possible.  Of note, the chest x-ray did raise a concern for multilobar pneumonia.  Would watch for any signs of infection.  Will follow along.  Corky Crafts., MD  03/29/2012  8:32 PM

## 2012-03-29 NOTE — Brief Op Note (Signed)
03/29/2012  2:37 PM  PATIENT:  Shaun [name]  78 y.o. male  PRE-OPERATIVE DIAGNOSIS:  LEFT URETER STONE  POST-OPERATIVE DIAGNOSIS:  LEFT URETER STONE  PROCEDURE:  Procedure(s) (LRB) with comments: CYSTOSCOPY WITH URETEROSCOPY (Left) CYSTOSCOPY WITH STENT PLACEMENT (Left)  SURGEON:  Surgeon(s) and Role:    * Milford Cage, MD - Primary  PHYSICIAN ASSISTANT:   ASSISTANTS: none   ANESTHESIA:   general  EBL:     BLOOD ADMINISTERED:none  DRAINS: none   LOCAL MEDICATIONS USED:  LIDOCAINE , Amount: 10 ml and OTHER B&O suppository.  SPECIMEN:  Source of Specimen:  Left ureter.  DISPOSITION OF SPECIMEN:  AUS lab for analysis.  COUNTS:  YES  TOURNIQUET:  * No tourniquets in log *  DICTATION: .Other Dictation: Dictation Number [id number]  PLAN OF CARE: Admit for overnight observation  PATIENT DISPOSITION:  PACU - hemodynamically stable.   Delay start of Pharmacological VTE agent (>24hrs) due to surgical blood loss or risk of bleeding: no

## 2012-03-29 NOTE — Progress Notes (Signed)
PACU NOTE:  PT with audible congestion,  RR  20-30 slightly labored, sats decreasing to 89-90 on canula(increased to 4L) with improvement to 90's.Pt denies difficulty with breathing but appears uncomfortable.  Dr Council Mechanic to bedside and evaluated pt, orders received.

## 2012-03-29 NOTE — Anesthesia Postprocedure Evaluation (Signed)
  Anesthesia Post-op Note  Patient: Shaun [name]  Procedure(s) Performed: Procedure(s) (LRB): CYSTOSCOPY WITH URETEROSCOPY (Left) CYSTOSCOPY WITH STENT PLACEMENT (Left)  Patient Location: PACU  Anesthesia Type: General  Level of Consciousness: awake and alert   Airway and Oxygen Therapy: Patient Spontanous Breathing  Post-op Pain: mild  Post-op Assessment: Post-op Vital signs reviewed, Patient's Cardiovascular Status Stable, Patent Airway and No signs of Nausea or vomiting  Post-op Vital Signs: unstable  Complications: Mild pulmonary edema in PACU on CXR. H/O CAD s/p MI in 11/05/11. EF 40-45% with moderate Aortic Stenosis.  Increased respiratory rate and work of breathing, decreased oxygen saturations requiring increased (simple face mask) oxygen. No chest pain. Breath sounds decreased. Heart rate about 103 (similar to preop), BP 150/80. Minimal response to Lasix 20 mg times 2.  I informed Drs. Manny and La Russell from Urology. Consulted Dr. Eldridge Dace from [facility] cardiology. Dr. Eldridge Dace recommended IV nitroglycerin and more Lasix. Transferred to stepdown unit with improved symptoms (decreased coughing).

## 2012-03-29 NOTE — Anesthesia Preprocedure Evaluation (Signed)
Anesthesia Evaluation  Patient identified by MRN, date of birth, ID band Patient awake    Reviewed: Allergy & Precautions, H&P , NPO status , Patient's Chart, lab work & pertinent test results  Airway Mallampati: II TM Distance: >3 FB Neck ROM: Full    Dental No notable dental hx.    Pulmonary shortness of breath, pneumonia -,  breath sounds clear to auscultation  Pulmonary exam normal       Cardiovascular hypertension, + CAD and + Past MI + Valvular Problems/Murmurs AS Rhythm:Regular Rate:Normal  MI 11/05/11, 1999   Neuro/Psych Anxiety negative neurological ROS     GI/Hepatic Neg liver ROS, GERD-  ,  Endo/Other  negative endocrine ROS  Renal/GU Renal diseasenegative Renal ROS  negative genitourinary   Musculoskeletal negative musculoskeletal ROS (+)   Abdominal   Peds negative pediatric ROS (+)  Hematology negative hematology ROS (+)   Anesthesia Other Findings   Reproductive/Obstetrics negative OB ROS                           Anesthesia Physical Anesthesia Plan  ASA: IV  Anesthesia Plan: General   Post-op Pain Management:    Induction: Intravenous  Airway Management Planned: LMA  Additional Equipment:   Intra-op Plan:   Post-operative Plan: Extubation in OR  Informed Consent: I have reviewed the patients History and Physical, chart, labs and discussed the procedure including the risks, benefits and alternatives for the proposed anesthesia with the patient or authorized representative who has indicated his/her understanding and acceptance.   Dental advisory given  Plan Discussed with: CRNA  Anesthesia Plan Comments:         Anesthesia Quick Evaluation

## 2012-03-30 ENCOUNTER — Encounter (HOSPITAL_COMMUNITY): Payer: Self-pay | Admitting: Urology

## 2012-03-30 LAB — BASIC METABOLIC PANEL
BUN: 23 mg/dL (ref 6–23)
Creatinine, Ser: 1.37 mg/dL — ABNORMAL HIGH (ref 0.50–1.35)
GFR calc Af Amer: 49 mL/min — ABNORMAL LOW (ref >90)
GFR calc non Af Amer: 42 mL/min — ABNORMAL LOW (ref >90)

## 2012-03-30 MED ORDER — FUROSEMIDE 40 MG PO TABS
40.0000 mg | ORAL_TABLET | Freq: Every day | ORAL | Status: DC
  Filled 2012-03-30: qty 1

## 2012-03-30 NOTE — Progress Notes (Signed)
Patient feeling well without increased SOB over baseline. Remains on 2L [place] with saturations in the mid to upper 90s.  Since he is stable, I will transfer him to a telemetry floor. We will try to wean his oxygen and discharge home tomorrow versus discharge home with home oxygen.

## 2012-03-30 NOTE — Progress Notes (Signed)
SUBJECTIVE:  Feels much better.  Tried to remove oxygen this morning and sats were 88%.  Oxygen restarted at 2L/min.  OBJECTIVE:   Vitals:   Filed Vitals:   03/30/12 0900 03/30/12 1000 03/30/12 1125 03/30/12 1200  BP: 108/63 105/59 107/70 109/63  Pulse: 87 84 82   Temp:    97.6 F (36.4 C)  TempSrc:    Axillary  Resp: 21 20 31    Height:      Weight:      SpO2: 95% 94% 94%    I&O's:   Intake/Output Summary (Last 24 hours) at 03/30/12 1407 Last data filed at 03/30/12 1200  Gross per 24 hour  Intake 1609.13 ml  Output   1355 ml  Net 254.13 ml   TELEMETRY: Reviewed telemetry pt in NSR:     PHYSICAL EXAM General: Well developed, well nourished, in no acute distress Head:  Normal cephalic and atramatic  Lungs:  No wheezing Heart:  RRR   Msk:  Back normal, normal gait. Normal strength and tone for age. Extremities: tr edema.  Neuro: Alert and oriented  Psych:   affenormalct, responds appropriately   LABS: Basic Metabolic Panel:  Basename 03/30/12 0325  NA 137  K 4.5  CL 99  CO2 30  GLUCOSE 111*  BUN 23  CREATININE 1.37*  CALCIUM 10.2  MG --  PHOS --   Liver Function Tests: No results found for this basename: AST:2,ALT:2,ALKPHOS:2,BILITOT:2,PROT:2,ALBUMIN:2 in the last 72 hours No results found for this basename: LIPASE:2,AMYLASE:2 in the last 72 hours CBC: No results found for this basename: WBC:2,NEUTROABS:2,HGB:2,HCT:2,MCV:2,PLT:2 in the last 72 hours Cardiac Enzymes: No results found for this basename: CKTOTAL:3,CKMB:3,CKMBINDEX:3,TROPONINI:3 in the last 72 hours BNP: No components found with this basename: POCBNP:3 D-Dimer: No results found for this basename: DDIMER:2 in the last 72 hours Hemoglobin A1C: No results found for this basename: HGBA1C in the last 72 hours Fasting Lipid Panel: No results found for this basename: CHOL,HDL,LDLCALC,TRIG,CHOLHDL,LDLDIRECT in the last 72 hours Thyroid Function Tests: No results found for this basename:  TSH,T4TOTAL,FREET3,T3FREE,THYROIDAB in the last 72 hours Anemia Panel: No results found for this basename: VITAMINB12,FOLATE,FERRITIN,TIBC,IRON,RETICCTPCT in the last 72 hours Coag Panel:   Lab Results  Component Value Date   INR 0.98 11/05/2011   INR 1.12 06/12/2010    RADIOLOGY: Dg Chest 2 View  03/27/2012  *RADIOLOGY REPORT*  Clinical Data: Preop for cystoscope.  History of myocardial infarction and CABG.  CHEST - 2 VIEW  Comparison: 12/22/2011 and 11/10/2011  Findings: Prior median sternotomy.  Mild right hemidiaphragm elevation.  Mild cardiomegaly with tortuous thoracic aorta. No pleural effusion or pneumothorax. Biapical pleural thickening.  Chronic interstitial thickening. Patchy areas of bibasilar airspace disease, favored to represent areas of scarring.  The questioned area of somewhat more nodular appearance at the right midlung on the prior exam is similar.  IMPRESSION: Cardiomegaly and similar bibasilar opacities, favored to represent areas of scarring. No acute superimposed process.   Original Report Authenticated By: Consuello Bossier, M.D.    Dg Chest Port 1 View  03/29/2012  *RADIOLOGY REPORT*  Clinical Data: Oxygen desaturation.  Chest congestion.  PORTABLE CHEST - 1 VIEW  Comparison: Chest x-ray 03/27/2012.  Findings: Significantly worsened aeration compared to the recent prior study with patchy multifocal interstitial and airspace disease most severe throughout the right mid and lower lung, with additional involvement at the left base.  Cephalization of the pulmonary vasculature, with generalized indistinctness of interstitial markings and some Kerley B lines throughout the periphery  of the lungs bilaterally, indicative of at least some of this is related to interstitial pulmonary edema. Mild elevation of the right hemidiaphragm is unchanged.  Small right pleural effusion.  Heart size is mildly enlarged with prominence of the left ventricular contour. The patient is rotated to the right  on today's exam, resulting in distortion of the mediastinal contours and reduced diagnostic sensitivity and specificity for mediastinal pathology.  Atherosclerotic calcifications within the arch of the aorta.  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  IMPRESSION: 1.  Interval development of mild interstitial pulmonary edema. 2.  In addition, however, there are more focal asymmetrically distributed interstitial and airspace opacities in a patchy distribution throughout the right mid and lower lung and left base, concerning for developing multilobar pneumonia. 3.  Cardiomegaly. 4.  Atherosclerosis.   Original Report Authenticated By: Florencia Reasons, M.D.       ASSESSMENT:  Resolving acute on chronic systolic heart failure  PLAN:  Retry oxygen wean.  He received a dose of lasix this morning which will likely help.  He is 96-99% on 2L oxygen.  I think he can be discharged when he is off of oxygen and he can resume his regular home medication.  BP well controlled.  He appears euvolemic.  If he is off oxygen later today, he can go home.  He seems a little wary of leaving today.  He may be more comfortable leaving tomorrow.    Corky Crafts., MD  03/30/2012  2:07 PM

## 2012-03-30 NOTE — Progress Notes (Signed)
Urology Progress Note  Subjective:     The patient required admission to step down unit due to pulmonary edema following surgery. The patient was having shortness of breath and tachypnea. He states he is doing better this morning and feels at baseline. No chest pain this morning. Patient states he is voiding without difficulty. Currently saturating in the mid to upper 90s on 6.5 L nasal cannula.  Chest x-ray yesterday showed pulmonary edema and possible lung infiltrates that could be early developing pneumonia. Patient denies worsening of his baseline shortness of breath, cough, or fever prior to surgery. We discussed the possibility of developing pneumonia.  ROS: Negative: chest pain  Objective:  Patient Vitals for the past 24 hrs:  BP Temp Temp src Pulse Resp SpO2 Height Weight  03/30/12 0700 105/74 mmHg - - 89  26  97 % - -  03/30/12 0600 100/60 mmHg - - 82  18  97 % - -  03/30/12 0500 97/55 mmHg - - 82  19  95 % - -  03/30/12 0400 95/51 mmHg 97.6 F (36.4 C) Oral 81  17  94 % - -  03/30/12 0300 137/78 mmHg - - 92  28  98 % - -  03/30/12 0200 89/52 mmHg - - 77  21  98 % - -  03/30/12 0100 111/73 mmHg - - 78  7  94 % - -  03/30/12 0000 117/75 mmHg 98.4 F (36.9 C) Oral 88  19  98 % - 69.7 kg (153 lb 10.6 oz)  03/29/12 2300 105/79 mmHg - - 88  20  96 % - -  03/29/12 2200 103/61 mmHg - - 89  19  95 % - -  03/29/12 2100 95/59 mmHg - - 95  25  93 % - -  03/29/12 2000 119/73 mmHg 97.4 F (36.3 C) Oral 99  25  95 % - -  03/29/12 1900 - - - - - - 5' 6.14" (1.68 m) 68 kg (149 lb 14.6 oz)  03/29/12 1850 124/77 mmHg - - 106  30  92 % - -  03/29/12 1815 138/74 mmHg - - 103  25  95 % - -  03/29/12 1800 147/85 mmHg 97.5 F (36.4 C) - 107  27  94 % - -  03/29/12 1745 137/85 mmHg - - 104  25  93 % - -  03/29/12 1730 151/88 mmHg - - 107  30  91 % - -  03/29/12 1715 154/90 mmHg - - 107  37  94 % - -  03/29/12 1700 160/89 mmHg - - 107  38  89 % - -  03/29/12 1645 156/95 mmHg 97.5 F (36.4 C) -  109  30  93 % - -  03/29/12 1630 158/92 mmHg - - 102  29  99 % - -  03/29/12 1615 152/91 mmHg - - 101  30  94 % - -  03/29/12 1600 153/96 mmHg - - 104  30  90 % - -  03/29/12 1545 163/104 mmHg 96.9 F (36.1 C) - 93  26  96 % - -  03/29/12 1530 150/93 mmHg - - 90  22  92 % - -  03/29/12 1515 161/97 mmHg 97.1 F (36.2 C) - 96  29  98 % - -  03/29/12 1500 161/102 mmHg - - 95  28  100 % - -  03/29/12 1445 153/95 mmHg - - 97  - 99 % - -  03/29/12  1444 153/95 mmHg 97.7 F (36.5 C) - 96  21  99 % - -  03/29/12 1221 133/92 mmHg 98.1 F (36.7 C) - 100  20  96 % - -    Physical Exam: General:  No acute distress, awake Cardiovascular:    [x]   S1/S2 present, RRR, positive murmur  []   Irregularly irregular Chest:  CTA-B Abdomen:               []  Soft, appropriately TTP  [x]  Soft, NTTP  []  Soft, appropriately TTP, incision(s) clean/dry/intact  Genitourinary: No genital edema Foley:  None    I/O last 3 completed shifts: In: 1225.1 [P.O.:90; I.V.:1135.1] Out: 1155 [Urine:1155]  Recent Labs  Basename 03/27/12 1130   HGB 13.6   WBC 8.6   PLT 255    Recent Labs  Basename 03/30/12 0325 03/27/12 1130   NA 137 140   K 4.5 4.6   CL 99 99   CO2 30 28   BUN 23 30*   CREATININE 1.37* 1.31   CALCIUM 10.2 11.0*   GFRNONAA 42* 45*   GFRAA 49* 52*     No results found for this basename: PT:2,INR:2,APTT:2 in the last 72 hours   No components found with this basename: ABG:2    Length of stay: 1 days.  Assessment: Left ureter stone. Pulmonary edema. Aortic stenosis. POD#1 Left ureteroscopy with stone extraction.  Plan: -Patient has left indwelling ureter stent with string attached. He will remove this on Monday on his own. He will be covered with ciprofloxacin while the stent is indwelling.  -Appreciate Dr. Hoyle Barr help and recommendations.  Patient will receive his usual morning dose of Lasix today. We will try to titrate down his oxygen.  -For his possible developing  pneumonia, I do not think antibiotics would be appropriate at this point as he is not having symptoms. I will recommend that he followup with his PCP in approximately one week for repeat chest x-ray to ensure this is not worsening, and he was warned to contact his physician if he were to develop fever, cough, or shortness of breath or other signs of pneumonia.  -Disposition: Depending on how the patient responds to his Lasix. It is possible he could be discharged today versus continued observation. I will await Dr. Hoyle Barr recommendation regarding disposition.     Natalia Leatherwood, MD [phone]

## 2012-03-30 NOTE — Op Note (Signed)
NAMECORNELIO, MUNCEY NO.:  [id number]  MEDICAL RECORD NO.:  [id number]  LOCATION:  1238                         FACILITY:  [facility]  PHYSICIAN:  Natalia Leatherwood, MD    DATE OF BIRTH:  1915/02/14  DATE OF PROCEDURE:  03/29/2012 DATE OF DISCHARGE:                              OPERATIVE REPORT   SURGEON:  Natalia Leatherwood, MD  ASSISTANT:  None.  PREOPERATIVE DIAGNOSIS:  Left ureteral stone.  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  PROCEDURES: 1. Cystoscopy. 2. Left ureteroscopy. 3. Basket stone retrieval. 4. Left distal ureter dilation. 5. Rluoroscopy with interpretation  6. Left ureteral stent placement.  COMPLICATIONS:  None.  SPECIMEN:  Stone removed and sent for chemical analysis at [facility].  DRAINS:  None.  FINDINGS:  Left distal ureteral stone and left distal ureter stenosis.  ESTIMATED BLOOD LOSS:  Minimal.  HISTORY OF PRESENT ILLNESS:  This is a 78 year old gentleman who has left ureteral stone.  He was unable to pass this over time and it was felt that surgery was indicated to try to remove the stone.  He was at moderate risk as noted by his cardiologist for the procedure.  He is able to stop his blood thinner before the procedure.  PROCEDURE:  After informed consent was obtained, the patient was taken to the operating room where he was placed in the supine position.  IV antibiotics were infused and general anesthesia was induced.  SCDs were turned on and placed prior to the induction of anesthesia.  He was placed in dorsal lithotomy position making sure to pad all pertinent neurovascular pressure points.  His genitals were prepped and draped in usual sterile fashion.  Time-out was performed, in which, the correct patient, surgical site, and procedure identified and agreed upon by the team.    A rigid cystoscope was advanced through the urethra into the bladder.  The left ureteral orifice was  identified and it was cannulated with a Sensor tip wire, which was placed up in the left renal pelvis on fluoroscopy.  I then placed a semi-rigid ureteroscope; however, I could not cannulate the orifice due to stenosis of the left ureteral orifice. Therefore, I took a 12-14 ureteral access sheath and only used the obturator and placed this in the distal ureter only.  This was done under fluoroscopic guidance and it went with ease.  After this was done, the safety wire was secured back to the drape and the semi-rigid ureteroscope was able to be placed beyond the ureteral orifice and into the ureter.  In the distal ureter, the stone was encountered and this was grasped with 0 tip Nitinol basket and removed.  I replaced the ureteroscope and there was no injury noted to the mucosa other than the distal ureter had been dilated.  There were no other stones present. I elected to leave the ureter stent with strings in place to help with any swelling that might occur following the dilation.  Therefore, the safety wire was placed back through the cystoscope and a 6 x 24 double-J stent was placed up with ease with a good curl noted on the left renal pelvis on fluoroscopy and  a curl in the bladder on direct visualization. Strings were left in place.  The bladder was drained and 10 mL of lidocaine jelly were placed into the urethra and a belladonna and opium suppository was placed into his rectum.  Rectal examination revealed no nodules of the prostate or concerning findings of the prostate.  The string was secured to his penis.  This completed the procedure.  He was placed back in supine position.  Anesthesia was reversed.  He was taken to the PACU in stable condition.  He will be kept overnight for observation due to his heart risk.          ______________________________ Natalia Leatherwood, MD     DW/MEDQ  D:  03/29/2012  T:  03/30/2012  Job:  [id number]

## 2012-03-31 DIAGNOSIS — I5021 Acute systolic (congestive) heart failure: Secondary | ICD-10-CM | POA: Diagnosis present

## 2012-03-31 MED ORDER — BISACODYL 10 MG RE SUPP
10.0000 mg | Freq: Once | RECTAL | Status: AC
  Administered 2012-03-31: 10 mg via RECTAL
  Filled 2012-03-31: qty 1

## 2012-03-31 MED ORDER — FUROSEMIDE 10 MG/ML IJ SOLN
40.0000 mg | Freq: Once | INTRAMUSCULAR | Status: AC
  Administered 2012-03-31: 40 mg via INTRAVENOUS
  Filled 2012-03-31: qty 4

## 2012-03-31 MED ORDER — CIPROFLOXACIN HCL 500 MG PO TABS: 250.0000 mg | ORAL_TABLET | Freq: Two times a day (BID) | ORAL | Status: DC

## 2012-03-31 MED ORDER — FUROSEMIDE 10 MG/ML IJ SOLN: 40.0000 mg | Freq: Every day | INTRAMUSCULAR | Status: DC

## 2012-03-31 MED ORDER — OXYCODONE HCL 5 MG PO TABS: 5.0000 mg | ORAL_TABLET | ORAL | Status: DC | PRN

## 2012-03-31 NOTE — Progress Notes (Signed)
Subjective:  Ready to go home with home O2.  Has follow up with Dr. Clovis Riley.  Feels much better. No significant SOB.   Objective:  Vital Signs in the last 24 hours: Temp:  [97.5 F (36.4 C)-98.2 F (36.8 C)] 97.5 F (36.4 C) (10/11 0458) Pulse Rate:  [78-95] 92  (10/11 0458) Resp:  [16-27] 20  (10/11 0458) BP: (96-112)/(55-79) 112/67 mmHg (10/11 0458) SpO2:  [84 %-99 %] 99 % (10/11 0458) Weight:  [68.6 kg (151 lb 3.8 oz)] 68.6 kg (151 lb 3.8 oz) (10/11 0458)  Intake/Output from previous day: 10/10 0701 - 10/11 0700 In: 834 [P.O.:640; I.V.:90; IV Piggyback:104] Out: 1025 [Urine:1025]   Physical Exam: General: Well developed, well nourished, in no acute distress. Head:  Normocephalic and atraumatic. Lungs: Clear to auscultation and percussion. Heart: Normal S1 and S2.  3/6 S murmur, rubs or gallops.  Abdomen: soft, non-tender, positive bowel sounds. Extremities: No clubbing or cyanosis. No edema. Neurologic: Alert and oriented x 3.    Lab Results: No results found for this basename: WBC:2,HGB:2,PLT:2 in the last 72 hours  Basename 03/30/12 0325  NA 137  K 4.5  CL 99  CO2 30  GLUCOSE 111*  BUN 23  CREATININE 1.37*   Imaging: Dg Chest Port 1 View  03/29/2012  *RADIOLOGY REPORT*  Clinical Data: Oxygen desaturation.  Chest congestion.  PORTABLE CHEST - 1 VIEW  Comparison: Chest x-ray 03/27/2012.  Findings: Significantly worsened aeration compared to the recent prior study with patchy multifocal interstitial and airspace disease most severe throughout the right mid and lower lung, with additional involvement at the left base.  Cephalization of the pulmonary vasculature, with generalized indistinctness of interstitial markings and some Kerley B lines throughout the periphery of the lungs bilaterally, indicative of at least some of this is related to interstitial pulmonary edema. Mild elevation of the right hemidiaphragm is unchanged.  Small right pleural effusion.  Heart size  is mildly enlarged with prominence of the left ventricular contour. The patient is rotated to the right on today's exam, resulting in distortion of the mediastinal contours and reduced diagnostic sensitivity and specificity for mediastinal pathology.  Atherosclerotic calcifications within the arch of the aorta.  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  IMPRESSION: 1.  Interval development of mild interstitial pulmonary edema. 2.  In addition, however, there are more focal asymmetrically distributed interstitial and airspace opacities in a patchy distribution throughout the right mid and lower lung and left base, concerning for developing multilobar pneumonia. 3.  Cardiomegaly. 4.  Atherosclerosis.   Original Report Authenticated By: Florencia Reasons, M.D.      Assessment/Plan:   Acute systolic heart failure  - feels better with IV/ PO lasix.   - OK to go home from cardiac perspective  - Continue home PO lasix.   AFIB  - now in NSR. On Amiodarone  - ASA.   CAD  - Imdur, ASA.   Aortic stenosis  - non surgical candidate.     SKAINS, MARK 03/31/2012, 1:53 PM

## 2012-03-31 NOTE — Progress Notes (Signed)
Saturation qualifications:  Patients saturation on room air at rest 88%  Patient saturation on room air while ambulating 85%  Patient saturation on 2liters oxygen while ambulating 90%.

## 2012-03-31 NOTE — Progress Notes (Signed)
Urology Progress Note  Subjective:     Patient was transferred from step down unit to the telemetry floor overnight. No complaints overnight. He complains of no increased shortness of breath and his breathing is at baseline. He desaturated into the low 80s while ambulating without oxygen, and promptly came back up with 2 L of oxygen. Voiding without difficulty.  We discussed discharge disposition. He would like to be discharged home. I agree. I will set him up for home oxygen with the plan for discharge home today.  ROS: Negative: chest pain  Objective:  Patient Vitals for the past 24 hrs:  BP Temp Temp src Pulse Resp SpO2 Height Weight  03/31/12 0458 112/67 mmHg 97.5 F (36.4 C) Oral 92  20  99 % 5\' 6"  (1.676 m) 68.6 kg (151 lb 3.8 oz)  03/31/12 0015 100/63 mmHg 97.6 F (36.4 C) Oral 95  16  95 % - -  03/30/12 2105 - - - - - 93 % - -  03/30/12 2100 98/55 mmHg 98.2 F (36.8 C) Oral 78  18  84 % - -  03/30/12 1820 100/79 mmHg - - 94  24  93 % - -  03/30/12 1600 96/59 mmHg 98.1 F (36.7 C) Oral 88  23  92 % - -  03/30/12 1530 106/65 mmHg - - 91  27  93 % - -  03/30/12 1400 - - - 85  17  93 % - -  03/30/12 1200 109/63 mmHg 97.6 F (36.4 C) Axillary - - - - -  03/30/12 1125 107/70 mmHg - - 82  31  94 % - -  03/30/12 1000 105/59 mmHg - - 84  20  94 % - -  03/30/12 0900 108/63 mmHg - - 87  21  95 % - -    Physical Exam: General:  No acute distress, awake Cardiovascular:    [x]   S1/S2 present, RRR, positive murmur  []   Irregularly irregular Chest:  CTA-B Abdomen:               []  Soft, appropriately TTP  [x]  Soft, NTTP  []  Soft, appropriately TTP, incision(s) clean/dry/intact  Genitourinary: No genital edema Foley:  None    I/O last 3 completed shifts: In: 1019.1 [P.O.:730; I.V.:185.1; IV Piggyback:104] Out: 2100 [Urine:2100]  No results found for this basename: HGB:2,WBC:2,PLT:2 in the last 72 hours  Recent Labs  [facility] 03/30/12 0325   NA 137   K 4.5   CL 99   CO2 30   BUN 23   CREATININE 1.37*   CALCIUM 10.2   GFRNONAA 42*   GFRAA 49*     No results found for this basename: PT:2,INR:2,APTT:2 in the last 72 hours   No components found with this basename: ABG:2    Length of stay: 2 days.  Assessment: Left ureter stone. Pulmonary edema. Aortic stenosis. POD#2 Left ureteroscopy with stone extraction.  Plan: We will have him set up for home oxygen likely at 2 L per minute per nasal cannula.  No issues from a urologic point of view.  Pulmonary edema seems to be resolving nicely.  Followup as an outpatient with PCP for repeat chest x-ray for the possible finding of developing pneumonia. He remained afebrile and without other signs or symptoms of pneumonia.     Natalia Leatherwood, MD [phone]

## 2012-03-31 NOTE — Progress Notes (Signed)
While ambulating on room air, pt desats to 84%.  Sats quickly increase to 94% once O2 replaced.

## 2012-03-31 NOTE — Discharge Summary (Signed)
Physician Discharge Summary  Patient ID: Shaun [name] MRN: [id number] DOB/AGE: [birth year] 78 y.o.  Admit date: 03/29/2012 Discharge date: 03/31/2012  Admission Diagnoses: Pulmonary edema. Aortic stenosis.  Discharge Diagnoses:  Pulmonary edema Aortic stenosis  Discharged Condition: good  Hospital Course:  This patient was admitted postoperatively due to pulmonary edema and aortic stenosis following an uncomplicated left ureteroscopy with stone removal. He experienced shortness of breath and tachypnea in the PACU and was found to have pulmonary edema. He was admitted to the step down unit and received Lasix. His cardiologist was consulted and followed along. The patient was able to regain his baseline breathing status on 2 L of oxygen per nasal cannula. He was able to void with ease. He had a good diuresis. Chest x-ray showed possible developing pneumonia, but patient does not have cough, fever, or signs of pneumonia. It was felt that he did not need to be treated with antibiotics based solely on findings from a chest x-ray. The patient was unable to be weaned off of oxygen and failed and ambulating oxygen test. He was able to have home oxygen set up with 2 L per nasal cannula.  He was instructed to restart his Plavix on Tuesday, 04/04/12. He will remove his left indwelling ureter stent Monday, 04/03/12.  Consults: cardiology  Significant Diagnostic Studies: radiology: Chest x-ray showed pulmonary edema and possible lung infiltrates that could be early developing pneumonia. Patient denied worsening of his baseline shortness of breath, cough, or fever prior to surgery.   Treatments: surgery: Cystoscopy, left ureteroscopy and Diuresis.  Discharge Exam: Blood pressure 112/67, pulse 92, temperature 97.5 F (36.4 C), temperature source Oral, resp. rate 20, height 5\' 6"  (1.676 m), weight 68.6 kg (151 lb 3.8 oz), SpO2 99.00%. Refer to PE from progress note on date of  discharge.  Disposition: Home.  Discharge Orders    Future Orders Please Complete By Expires   Discharge patient          Medication List     As of 03/31/2012  1:24 PM    STOP taking these medications         clopidogrel 75 MG tablet   Commonly known as: PLAVIX      TAKE these medications         acetaminophen 325 MG tablet   Commonly known as: TYLENOL   Take 2 tablets (650 mg total) by mouth every 4 (four) hours as needed.      ALPRAZolam 0.25 MG tablet   Commonly known as: XANAX   Take 0.12-0.25 mg by mouth at bedtime as needed.      amiodarone 200 MG tablet   Commonly known as: PACERONE   Take 200 mg by mouth every morning.      aspirin EC 81 MG tablet   Take 81 mg by mouth daily.      atorvastatin 40 MG tablet   Commonly known as: LIPITOR   Take 40 mg by mouth at bedtime.      ciprofloxacin 500 MG tablet   Commonly known as: CIPRO   Take 0.5 tablets (250 mg total) by mouth 2 (two) times daily.      docusate sodium 100 MG capsule   Commonly known as: COLACE   Take 100-200 mg by mouth 2 (two) times daily. Take one tablet in the morning and two tablets in the evening.      furosemide 40 MG tablet   Commonly known as: LASIX   Take 40 mg by mouth  every morning.      isosorbide mononitrate 60 MG 24 hr tablet   Commonly known as: IMDUR   Take 60 mg by mouth at bedtime.      loratadine 10 MG tablet   Commonly known as: CLARITIN   Take 10 mg by mouth daily as needed. For allergies      multivitamins ther. w/minerals Tabs   Take 1 tablet by mouth daily.      beta carotene w/minerals tablet   Take 1 tablet by mouth daily.      nitroGLYCERIN 0.4 MG SL tablet   Commonly known as: NITROSTAT   Place 0.4 mg under the tongue every 5 (five) minutes as needed. For chest pain.      omeprazole 20 MG capsule   Commonly known as: PRILOSEC   Take 1 capsule (20 mg total) by mouth daily.      Osteo Bi-Flex Adv Joint Shield Tabs   Take 1 tablet by mouth 2 (two)  times daily.      OVER THE COUNTER MEDICATION   Herb-lax two in the am and 2 in pm      oxyCODONE 5 MG immediate release tablet   Commonly known as: Oxy IR/ROXICODONE   Take 1-2 tablets (5-10 mg total) by mouth every 4 (four) hours as needed for pain.      potassium chloride 10 MEQ tablet   Commonly known as: K-DUR,KLOR-CON   Take 10 mEq by mouth 2 (two) times daily.           Follow-up Information    Follow up with Milford Cage, MD. On 04/07/2012. (10:15 am)    Contact information:   [address] [facility] [place] [address] [phone]       Follow up with Benita Stabile, MD. (Follow up with your PCP in 7-10 days with Chest x-ray to ensure there is no development of pneumonia.)    Contact information:   [facility] [address] [place] [address] [phone]          Signed: Milford Cage 03/31/2012, 1:24 PM

## 2012-03-31 NOTE — Progress Notes (Signed)
SATURATION QUALIFICATIONS:  Patient Saturations on Room Air at Rest:____  Patient Saturations on [employer] while Ambulating:____  Patient Saturations on____ Liters of oxygen while Ambulating:____  Statement of medical necessity for home oxygen:   Please document sats above

## 2012-03-31 NOTE — Care Management Note (Signed)
    Page 1 of 2   03/31/2012     7:20:42 PM   CARE MANAGEMENT NOTE 03/31/2012  Patient:  Shaun [name], Shaun [name]   Account Number:  [id number]  Date Initiated:  03/30/2012  Documentation initiated by:  DAVIS,RHONDA  Subjective/Objective Assessment:   pt had outpt surg became dyspneic with pulm. edema, admitted into sud/pt is not observation is inpatient.     Action/Plan:   from home   Anticipated DC Date:  03/31/2012   Anticipated DC Plan:  HOME/SELF CARE      DC Planning Services  CM consult      Choice offered to / List presented to:     DME arranged  [facility]      DME agency  [facility]        Status of service:  Completed, signed off Medicare Important Message given?   (If response is "NO", the following Medicare IM given date fields will be blank) Date Medicare IM given:   Date Additional Medicare IM given:    Discharge Disposition:  HOME/SELF CARE  Per UR Regulation:  Reviewed for med. necessity/level of care/duration of stay  If discussed at Long Length of Stay Meetings, dates discussed:    Comments:  03/31/12 Shaun Stelzer RN,BSN NCM [phone] QUALIFIED FOR HOME O2.[facility]) PROVIDED HOME 02 BROUGHT TO RM.   [id number] Earlene Plater, RN, BSN, CCM: CHART REVIEWED AND UPDATED. NO DISCHARGE NEEDS PRESENT AT THIS TIME. CASE MANAGEMENT [phone]

## 2012-04-29 ENCOUNTER — Emergency Department (HOSPITAL_COMMUNITY): Payer: MEDICARE

## 2012-04-29 ENCOUNTER — Encounter (HOSPITAL_COMMUNITY): Admission: EM | Disposition: A | Discharge status: Home / Self Care | Payer: Self-pay | Source: Home / Self Care

## 2012-04-29 ENCOUNTER — Encounter (HOSPITAL_COMMUNITY): Payer: Self-pay | Admitting: Anesthesiology

## 2012-04-29 ENCOUNTER — Emergency Department (HOSPITAL_COMMUNITY): Payer: MEDICARE | Admitting: Anesthesiology

## 2012-04-29 ENCOUNTER — Encounter (HOSPITAL_COMMUNITY): Payer: Self-pay | Admitting: Emergency Medicine

## 2012-04-29 ENCOUNTER — Inpatient Hospital Stay (HOSPITAL_COMMUNITY)
Admission: EM | Admit: 2012-04-29 | Discharge: 2012-05-01 | DRG: 350 | Disposition: A | Discharge status: Home / Self Care | Payer: MEDICARE | Attending: General Surgery | Admitting: General Surgery

## 2012-04-29 DIAGNOSIS — Z85828 Personal history of other malignant neoplasm of skin: Secondary | ICD-10-CM

## 2012-04-29 DIAGNOSIS — Z87442 Personal history of urinary calculi: Secondary | ICD-10-CM

## 2012-04-29 DIAGNOSIS — I509 Heart failure, unspecified: Secondary | ICD-10-CM | POA: Diagnosis not present

## 2012-04-29 DIAGNOSIS — K56609 Unspecified intestinal obstruction, unspecified as to partial versus complete obstruction: Secondary | ICD-10-CM | POA: Diagnosis present

## 2012-04-29 DIAGNOSIS — I2119 ST elevation (STEMI) myocardial infarction involving other coronary artery of inferior wall: Secondary | ICD-10-CM

## 2012-04-29 DIAGNOSIS — K4030 Unilateral inguinal hernia, with obstruction, without gangrene, not specified as recurrent: Secondary | ICD-10-CM | POA: Diagnosis present

## 2012-04-29 DIAGNOSIS — I350 Nonrheumatic aortic (valve) stenosis: Secondary | ICD-10-CM

## 2012-04-29 DIAGNOSIS — I5021 Acute systolic (congestive) heart failure: Secondary | ICD-10-CM | POA: Diagnosis not present

## 2012-04-29 DIAGNOSIS — I2510 Atherosclerotic heart disease of native coronary artery without angina pectoris: Secondary | ICD-10-CM | POA: Diagnosis present

## 2012-04-29 DIAGNOSIS — I359 Nonrheumatic aortic valve disorder, unspecified: Secondary | ICD-10-CM

## 2012-04-29 DIAGNOSIS — IMO0001 Reserved for inherently not codable concepts without codable children: Secondary | ICD-10-CM

## 2012-04-29 DIAGNOSIS — I252 Old myocardial infarction: Secondary | ICD-10-CM

## 2012-04-29 DIAGNOSIS — N189 Chronic kidney disease, unspecified: Secondary | ICD-10-CM | POA: Diagnosis present

## 2012-04-29 DIAGNOSIS — Z951 Presence of aortocoronary bypass graft: Secondary | ICD-10-CM

## 2012-04-29 DIAGNOSIS — F411 Generalized anxiety disorder: Secondary | ICD-10-CM | POA: Diagnosis present

## 2012-04-29 DIAGNOSIS — I129 Hypertensive chronic kidney disease with stage 1 through stage 4 chronic kidney disease, or unspecified chronic kidney disease: Secondary | ICD-10-CM | POA: Diagnosis present

## 2012-04-29 DIAGNOSIS — Z7902 Long term (current) use of antithrombotics/antiplatelets: Secondary | ICD-10-CM

## 2012-04-29 DIAGNOSIS — Z7982 Long term (current) use of aspirin: Secondary | ICD-10-CM

## 2012-04-29 DIAGNOSIS — M1990 Unspecified osteoarthritis, unspecified site: Secondary | ICD-10-CM | POA: Diagnosis present

## 2012-04-29 DIAGNOSIS — Z9089 Acquired absence of other organs: Secondary | ICD-10-CM

## 2012-04-29 DIAGNOSIS — I10 Essential (primary) hypertension: Secondary | ICD-10-CM

## 2012-04-29 DIAGNOSIS — IMO0002 Reserved for concepts with insufficient information to code with codable children: Secondary | ICD-10-CM | POA: Diagnosis present

## 2012-04-29 DIAGNOSIS — Z66 Do not resuscitate: Secondary | ICD-10-CM | POA: Diagnosis present

## 2012-04-29 DIAGNOSIS — K4131 Unilateral femoral hernia, with obstruction, without gangrene, recurrent: Principal | ICD-10-CM | POA: Diagnosis present

## 2012-04-29 DIAGNOSIS — R011 Cardiac murmur, unspecified: Secondary | ICD-10-CM | POA: Diagnosis present

## 2012-04-29 DIAGNOSIS — I9589 Other hypotension: Secondary | ICD-10-CM | POA: Diagnosis not present

## 2012-04-29 DIAGNOSIS — E785 Hyperlipidemia, unspecified: Secondary | ICD-10-CM | POA: Diagnosis present

## 2012-04-29 DIAGNOSIS — Z8249 Family history of ischemic heart disease and other diseases of the circulatory system: Secondary | ICD-10-CM

## 2012-04-29 DIAGNOSIS — Z888 Allergy status to other drugs, medicaments and biological substances status: Secondary | ICD-10-CM

## 2012-04-29 DIAGNOSIS — K219 Gastro-esophageal reflux disease without esophagitis: Secondary | ICD-10-CM | POA: Diagnosis present

## 2012-04-29 DIAGNOSIS — Z79899 Other long term (current) drug therapy: Secondary | ICD-10-CM

## 2012-04-29 HISTORY — PX: INGUINAL HERNIA REPAIR: SHX194

## 2012-04-29 LAB — LIPASE, BLOOD: Lipase: 46 U/L (ref 11–59)

## 2012-04-29 LAB — CBC WITH DIFFERENTIAL/PLATELET
Basophils Relative: 0 % (ref 0–1)
Eosinophils Absolute: 0.1 10*3/uL (ref 0.0–0.7)
Eosinophils Relative: 1 % (ref 0–5)
Lymphs Abs: 1.3 10*3/uL (ref 0.7–4.0)
MCH: 30.8 pg (ref 26.0–34.0)
MCHC: 33.4 g/dL (ref 30.0–36.0)
MCV: 92.2 fL (ref 78.0–100.0)
Monocytes Relative: 10 % (ref 3–12)
Platelets: 216 10*3/uL (ref 150–400)
RBC: 4.35 MIL/uL (ref 4.22–5.81)

## 2012-04-29 LAB — COMPREHENSIVE METABOLIC PANEL
BUN: 28 mg/dL — ABNORMAL HIGH (ref 6–23)
Calcium: 10.8 mg/dL — ABNORMAL HIGH (ref 8.4–10.5)
GFR calc Af Amer: 55 mL/min — ABNORMAL LOW (ref >90)
Glucose, Bld: 128 mg/dL — ABNORMAL HIGH (ref 70–99)
Sodium: 139 mEq/L (ref 135–145)
Total Protein: 7 g/dL (ref 6.0–8.3)

## 2012-04-29 LAB — URINALYSIS, ROUTINE W REFLEX MICROSCOPIC
Leukocytes, UA: NEGATIVE
Nitrite: NEGATIVE
Protein, ur: NEGATIVE mg/dL
Urobilinogen, UA: 0.2 mg/dL (ref 0.0–1.0)

## 2012-04-29 LAB — POCT I-STAT TROPONIN I

## 2012-04-29 SURGERY — REPAIR, HERNIA, INGUINAL, INCARCERATED
Anesthesia: Choice | Site: Groin | Laterality: Left | Wound class: Clean

## 2012-04-29 MED ORDER — NITROGLYCERIN 0.4 MG SL SUBL: 0.4000 mg | SUBLINGUAL_TABLET | SUBLINGUAL | Status: DC | PRN

## 2012-04-29 MED ORDER — CEFAZOLIN SODIUM-DEXTROSE 2-3 GM-% IV SOLR
2.0000 g | INTRAVENOUS | Status: AC
  Administered 2012-04-30: 2 g via INTRAVENOUS
  Filled 2012-04-29: qty 50

## 2012-04-29 MED ORDER — BUPIVACAINE-EPINEPHRINE 0.25% -1:200000 IJ SOLN
INTRAMUSCULAR | Status: DC | PRN
  Administered 2012-04-29: 40 mL

## 2012-04-29 MED ORDER — IOHEXOL 300 MG/ML  SOLN
20.0000 mL | INTRAMUSCULAR | Status: AC
  Administered 2012-04-29 (×2): 20 mL via ORAL

## 2012-04-29 MED ORDER — HYDROCODONE-ACETAMINOPHEN 5-325 MG PO TABS: 1.0000 | ORAL_TABLET | ORAL | Status: DC | PRN

## 2012-04-29 MED ORDER — PHENYLEPHRINE HCL 10 MG/ML IJ SOLN
10.0000 mg | INTRAVENOUS | Status: DC | PRN
  Administered 2012-04-29: 15 ug/min via INTRAVENOUS

## 2012-04-29 MED ORDER — ONDANSETRON HCL 4 MG/2ML IJ SOLN
4.0000 mg | Freq: Once | INTRAMUSCULAR | Status: AC
  Administered 2012-04-29: 4 mg via INTRAVENOUS
  Filled 2012-04-29: qty 2

## 2012-04-29 MED ORDER — DEXMEDETOMIDINE HCL 200 MCG/2ML IV SOLN
INTRAVENOUS | Status: DC | PRN
  Administered 2012-04-29: 30 ug via INTRAVENOUS

## 2012-04-29 MED ORDER — PHENYLEPHRINE HCL 10 MG/ML IJ SOLN
30.0000 ug/min | INTRAVENOUS | Status: DC
  Filled 2012-04-29: qty 1

## 2012-04-29 MED ORDER — ALBUMIN HUMAN 5 % IV SOLN
12.5000 g | Freq: Once | INTRAVENOUS | Status: AC
  Administered 2012-04-29: 12.5 g via INTRAVENOUS

## 2012-04-29 MED ORDER — FUROSEMIDE 20 MG PO TABS
40.0000 mg | ORAL_TABLET | Freq: Once | ORAL | Status: AC
  Administered 2012-04-29: 40 mg via ORAL
  Filled 2012-04-29: qty 2

## 2012-04-29 MED ORDER — LACTATED RINGERS IV SOLN
INTRAVENOUS | Status: DC | PRN
  Administered 2012-04-29: 13:00:00 via INTRAVENOUS

## 2012-04-29 MED ORDER — SODIUM CHLORIDE 0.9 % IV SOLN
INTRAVENOUS | Status: DC
  Administered 2012-04-29: 1000 mL via INTRAVENOUS

## 2012-04-29 MED ORDER — 0.9 % SODIUM CHLORIDE (POUR BTL) OPTIME
TOPICAL | Status: DC | PRN
  Administered 2012-04-29: 1000 mL

## 2012-04-29 MED ORDER — ONDANSETRON HCL 4 MG/2ML IJ SOLN: 4.0000 mg | Freq: Four times a day (QID) | INTRAMUSCULAR | Status: DC | PRN

## 2012-04-29 MED ORDER — FENTANYL CITRATE 0.05 MG/ML IJ SOLN
INTRAMUSCULAR | Status: DC | PRN
  Administered 2012-04-29 (×2): 25 ug via INTRAVENOUS

## 2012-04-29 MED ORDER — ACETAMINOPHEN 10 MG/ML IV SOLN
INTRAVENOUS | Status: DC | PRN
  Administered 2012-04-29: 1000 mg via INTRAVENOUS

## 2012-04-29 MED ORDER — HYDROMORPHONE HCL PF 1 MG/ML IJ SOLN: 0.2500 mg | INTRAMUSCULAR | Status: DC | PRN

## 2012-04-29 MED ORDER — HYDROMORPHONE HCL PF 1 MG/ML IJ SOLN: 0.5000 mg | INTRAMUSCULAR | Status: DC | PRN

## 2012-04-29 MED ORDER — ACETAMINOPHEN 10 MG/ML IV SOLN: 1000.0000 mg | Freq: Once | INTRAVENOUS | Status: DC | PRN

## 2012-04-29 MED ORDER — ONDANSETRON HCL 4 MG/2ML IJ SOLN: 4.0000 mg | Freq: Once | INTRAMUSCULAR | Status: DC | PRN

## 2012-04-29 MED ORDER — DEXMEDETOMIDINE HCL IN NACL 200 MCG/50ML IV SOLN
0.4000 ug/kg/h | INTRAVENOUS | Status: DC
  Filled 2012-04-29 (×2): qty 50

## 2012-04-29 MED ORDER — DEXMEDETOMIDINE HCL IN NACL 200 MCG/50ML IV SOLN
INTRAVENOUS | Status: DC | PRN
  Administered 2012-04-29: 0.5 ug/kg/h via INTRAVENOUS

## 2012-04-29 MED ORDER — CEFAZOLIN SODIUM-DEXTROSE 2-3 GM-% IV SOLR
INTRAVENOUS | Status: DC | PRN
  Administered 2012-04-29: 2 g via INTRAVENOUS

## 2012-04-29 MED ORDER — FUROSEMIDE 10 MG/ML IJ SOLN
40.0000 mg | Freq: Every day | INTRAMUSCULAR | Status: DC
  Administered 2012-04-30: 40 mg via INTRAVENOUS
  Filled 2012-04-29 (×2): qty 4

## 2012-04-29 MED ORDER — BUPIVACAINE-EPINEPHRINE PF 0.25-1:200000 % IJ SOLN
INTRAMUSCULAR | Status: AC
  Filled 2012-04-29: qty 30

## 2012-04-29 MED ORDER — IOHEXOL 300 MG/ML  SOLN
80.0000 mL | Freq: Once | INTRAMUSCULAR | Status: AC | PRN
  Administered 2012-04-29: 80 mL via INTRAVENOUS

## 2012-04-29 MED ORDER — ALBUMIN HUMAN 5 % IV SOLN
INTRAVENOUS | Status: AC
  Administered 2012-04-29: 12.5 g via INTRAVENOUS
  Filled 2012-04-29: qty 250

## 2012-04-29 SURGICAL SUPPLY — 55 items
ADH SKN CLS APL DERMABOND .7 (GAUZE/BANDAGES/DRESSINGS) ×2
APL SKNCLS STERI-STRIP NONHPOA (GAUZE/BANDAGES/DRESSINGS) ×2
BENZOIN TINCTURE PRP APPL 2/3 (GAUZE/BANDAGES/DRESSINGS) ×3 IMPLANT
BLADE SURG 15 STRL LF DISP TIS (BLADE) ×2 IMPLANT
BLADE SURG 15 STRL SS (BLADE) ×3
BLADE SURG ROTATE 9660 (MISCELLANEOUS) ×2 IMPLANT
CHLORAPREP W/TINT 26ML (MISCELLANEOUS) ×3 IMPLANT
CLOTH BEACON ORANGE TIMEOUT ST (SAFETY) ×3 IMPLANT
COVER SURGICAL LIGHT HANDLE (MISCELLANEOUS) ×3 IMPLANT
DECANTER SPIKE VIAL GLASS SM (MISCELLANEOUS) ×3 IMPLANT
DERMABOND ADVANCED (GAUZE/BANDAGES/DRESSINGS) ×1
DERMABOND ADVANCED .7 DNX12 (GAUZE/BANDAGES/DRESSINGS) ×2 IMPLANT
DRAIN PENROSE 1/2X12 LTX STRL (WOUND CARE) IMPLANT
DRAPE LAPAROSCOPIC ABDOMINAL (DRAPES) ×2 IMPLANT
DRAPE LAPAROTOMY TRNSV 102X78 (DRAPE) IMPLANT
DRAPE UTILITY 15X26 W/TAPE STR (DRAPE) ×6 IMPLANT
DRSG TEGADERM 4X4.75 (GAUZE/BANDAGES/DRESSINGS) IMPLANT
ELECT CAUTERY BLADE 6.4 (BLADE) ×3 IMPLANT
ELECT REM PT RETURN 9FT ADLT (ELECTROSURGICAL) ×3
ELECTRODE REM PT RTRN 9FT ADLT (ELECTROSURGICAL) ×2 IMPLANT
GAUZE SPONGE 4X4 16PLY XRAY LF (GAUZE/BANDAGES/DRESSINGS) ×3 IMPLANT
GLOVE BIO SURGEON STRL SZ7.5 (GLOVE) ×3 IMPLANT
GLOVE BIOGEL PI IND STRL 8 (GLOVE) ×2 IMPLANT
GLOVE BIOGEL PI INDICATOR 8 (GLOVE) ×1
GOWN STRL NON-REIN LRG LVL3 (GOWN DISPOSABLE) ×6 IMPLANT
GOWN STRL REIN XL XLG (GOWN DISPOSABLE) ×3 IMPLANT
KIT BASIN OR (CUSTOM PROCEDURE TRAY) ×3 IMPLANT
KIT ROOM TURNOVER OR (KITS) ×3 IMPLANT
MESH ULTRAPRO 6X6 15CM15CM (Mesh General) ×2 IMPLANT
NDL HYPO 25GX1X1/2 BEV (NEEDLE) ×1 IMPLANT
NEEDLE HYPO 25GX1X1/2 BEV (NEEDLE) ×3 IMPLANT
NS IRRIG 1000ML POUR BTL (IV SOLUTION) ×3 IMPLANT
PACK SURGICAL SETUP 50X90 (CUSTOM PROCEDURE TRAY) ×1 IMPLANT
PAD ARMBOARD 7.5X6 YLW CONV (MISCELLANEOUS) ×3 IMPLANT
PENCIL BUTTON HOLSTER BLD 10FT (ELECTRODE) ×1 IMPLANT
SPECIMEN JAR SMALL (MISCELLANEOUS) ×2 IMPLANT
SPONGE GAUZE 4X4 12PLY (GAUZE/BANDAGES/DRESSINGS) IMPLANT
SPONGE INTESTINAL PEANUT (DISPOSABLE) IMPLANT
STRIP CLOSURE SKIN 1/2X4 (GAUZE/BANDAGES/DRESSINGS) IMPLANT
SUT MNCRL AB 4-0 PS2 18 (SUTURE) ×3 IMPLANT
SUT PDS AB 0 CT 36 (SUTURE) IMPLANT
SUT PROLENE 2 0 SH DA (SUTURE) ×3 IMPLANT
SUT SILK 2 0 SH (SUTURE) IMPLANT
SUT SILK 3 0 (SUTURE) ×3
SUT SILK 3-0 18XBRD TIE 12 (SUTURE) ×2 IMPLANT
SUT VIC AB 0 CT2 27 (SUTURE) IMPLANT
SUT VIC AB 2-0 SH 27 (SUTURE) ×3
SUT VIC AB 2-0 SH 27X BRD (SUTURE) ×2 IMPLANT
SUT VIC AB 3-0 SH 27 (SUTURE) ×6
SUT VIC AB 3-0 SH 27XBRD (SUTURE) ×3 IMPLANT
SYR CONTROL 10ML LL (SYRINGE) ×3 IMPLANT
TOWEL OR 17X24 6PK STRL BLUE (TOWEL DISPOSABLE) ×3 IMPLANT
TOWEL OR 17X26 10 PK STRL BLUE (TOWEL DISPOSABLE) ×3 IMPLANT
TUBE CONNECTING 12X1/4 (SUCTIONS) IMPLANT
YANKAUER SUCT BULB TIP NO VENT (SUCTIONS) IMPLANT

## 2012-04-29 NOTE — Preoperative (Signed)
Beta Blockers   Reason not to administer Beta Blockers:Not Applicable 

## 2012-04-29 NOTE — Op Note (Signed)
Pre Operative Diagnosis:  Incarcerated left inguinal hernia  Post Operative Diagnosis: incarcerated left femoral hernia  Procedure:  Left femoral hernia repair with mesh  Surgeon: Dr. Axel Filler  Assistant: Dr. Magnus Ivan  Anesthesia: local  EBL: 5 cc  Complications: none  Counts: reported as correct x 2  Findings: The patient had an incarcerated left femoral hernia. The bowel was viable. The hernia sac was resected and sent to pathology.  Indications for procedure:   The patient is a 78 year old male who presents to the ED with one-day history of abdominal pain to left lower quadrant. Patient underwent CT scan was found to have small bowel obstruction and a incarcerated inguinal hernia. This patient was taken back emergently for repair of his hernia.  Details of the procedure:The patient was taken back to the operating room. The patient was placed in supine position with bilateral SCDs in place. After appropriate anitbiotics were confirmed, a time-out was confirmed and all facts were verified.  a posterior incision was made in the left inguinal area after local is infiltrated into the skin and subcutaneous space.  A 10 blade was used to cut through the skin as well as subcutaneous tissue the Scarpa's fascia was incised using cautery to maintain hemostasis & dissection was taken down the external oblique. The external oblique was incised sharply with Metzenbaum scissors, there was no external inguinal ring. At this point the spermatic cord identifying the vas deferens was identified there was no hernia sac and be seen at this time. However the patient did have a weak hernia floor. In palpating this a left femoral hernia was palpated underneath the inguinal ligament. There was decent small bowel that could be palpated. The femoral defect was enlarged with blunt dissection and the small bowel was brought up through the hernia sac the sac was incised the bowel was inspected it was viable.  The  hernia sac was then ligated and sent to pathology the femoral defect was then reapproximated to Cooper's ligament the interrupted 2-0 Vicryl's, reapproximating Cooper's ligament to the shelving edge of the external oblique. Once this was done a piece of 6 x 6 Prolene mesh was then placed into the hernia for and was anchored to the pubic tubercle using a 2 -0 Prolene standard running fashion. The tails were then brought together in the internal ring was reapproximated allowing approximately 1 fingerbreadth in between the mesh. The external oblique was then reapproximated with 3-0 Vicryl in a running fashion. Scarpa's fascia was reapproximated using 3-0 Vicryl in interrupted fashion. Skin is reapproximated using 4 Monocryl in subcuticular fashion. The skin was then dressed with Dermabond. The patient was taken to recovery in stable condition.

## 2012-04-29 NOTE — ED Notes (Signed)
Pt started having abdominal pain x2 days intermittently. LUQ. Nauseous, but no vomiting. Taken 324mg  aspirin at home. Vitals wdl. Bypass in 1998.

## 2012-04-29 NOTE — H&P (Signed)
Dx: incarcerated inguinal hernia  Plan: To OR for open hernia repair with mesh

## 2012-04-29 NOTE — H&P (Signed)
Shaun [name] 1915-04-25  [id number].   Chief Complaint/Reason for Consult: SBO secondary to incarcerated left inguinal hernia HPI: This is a 78 yo with multiple medical problems who presented to the [facility] after 36 hours of left lower abdominal/groin pain.  He developed nausea but no emesis.  He had a BM yesterday.  He had a CT scan that revealed an incarcerated left inguinal hernia with a SBO.  We have been asked to evaluate for surgical intervention.  Review of systems: Please see HPI, otherwise all other systems are negative.  Family History  Problem Relation Age of Onset  . Cancer Father   . Coronary artery disease Father   . Stroke Mother     Past Medical History  Diagnosis Date  . Hypertension   . CAD (coronary artery disease)   . Osteoarthritis   . DDD (degenerative disc disease)   . Spinal stenosis   . Heart murmur   . Shortness of breath   . Myocardial infarct     11/05/11 , 1999  . Anxiety   . Pneumonia     hx of double pneumonia   . GERD (gastroesophageal reflux disease)   . Chronic kidney disease     hxof multiple kidney stones   . Cancer     hx of skin cancery     Past Surgical History  Procedure Date  . Tonsillectomy   . Coronary artery bypass graft     1999  . Lithotripsy   . Hernia repair     bilateral 1947   . Cardiac catheterization   . Nasal septum surgery   . Cystoscopy with ureteroscopy 03/29/2012    Procedure: CYSTOSCOPY WITH URETEROSCOPY;  Surgeon: Milford Cage, MD;  Location: [facility];  Service: Urology;  Laterality: Left;    Social History:  reports that he has never smoked. He has never used smokeless tobacco. He reports that he does not drink alcohol or use illicit drugs.  Allergies:  Allergies  Allergen Reactions  . Cocaine      (Not in a hospital admission)  Blood pressure 155/87, pulse 100, temperature 97.4 F (36.3 C), temperature source Oral, resp. rate 19, SpO2 95.00%. Physical Exam: General: pleasant, WD, WN  white male who is laying in bed in NAD HEENT: head is normocephalic, atraumatic.  Sclera are noninjected.  PERRL.  Ears and nose without any masses or lesions.  Mouth is pink and moist Heart: regular, rate, and rhythm.  Normal s1,s2. No obvious murmurs, gallops, or rubs noted.   Lungs: CTAB, no wheezes, rhonchi, or rales noted.  Respiratory effort nonlabored Abd: soft, mild distention, +BS, no masses or organomegaly, left incarcerated inguinal hernia.  Attempts at reduction at bedside failed. MS: all 4 extremities are symmetrical with no cyanosis, clubbing, or edema. Psych: A&Ox3 with an appropriate affect.    Results for orders placed during the hospital encounter of 04/29/12 (from the past 48 hour(s))  CBC WITH DIFFERENTIAL     Status: Abnormal   Collection Time   04/29/12  7:33 AM      Component Value Range Comment   WBC 11.0 (*) 4.0 - 10.5 K/uL    RBC 4.35  4.22 - 5.81 MIL/uL    Hemoglobin 13.4  13.0 - 17.0 g/dL    HCT 52.8  41.3 - 24.4 %    MCV 92.2  78.0 - 100.0 fL    MCH 30.8  26.0 - 34.0 pg    MCHC 33.4  30.0 - 36.0 g/dL  RDW 15.1  11.5 - 15.5 %    Platelets 216  150 - 400 K/uL    Neutrophils Relative 77  43 - 77 %    Neutro Abs 8.5 (*) 1.7 - 7.7 K/uL    Lymphocytes Relative 12  12 - 46 %    Lymphs Abs 1.3  0.7 - 4.0 K/uL    Monocytes Relative 10  3 - 12 %    Monocytes Absolute 1.2 (*) 0.1 - 1.0 K/uL    Eosinophils Relative 1  0 - 5 %    Eosinophils Absolute 0.1  0.0 - 0.7 K/uL    Basophils Relative 0  0 - 1 %    Basophils Absolute 0.0  0.0 - 0.1 K/uL   COMPREHENSIVE METABOLIC PANEL     Status: Abnormal   Collection Time   04/29/12  7:33 AM      Component Value Range Comment   Sodium 139  135 - 145 mEq/L    Potassium 4.8  3.5 - 5.1 mEq/L    Chloride 98  96 - 112 mEq/L    CO2 31  19 - 32 mEq/L    Glucose, Bld 128 (*) 70 - 99 mg/dL    BUN 28 (*) 6 - 23 mg/dL    Creatinine, Ser 1.61  0.50 - 1.35 mg/dL    Calcium 09.6 (*) 8.4 - 10.5 mg/dL    Total Protein 7.0  6.0 -  8.3 g/dL    Albumin 3.8  3.5 - 5.2 g/dL    AST 19  0 - 37 U/L    ALT 10  0 - 53 U/L    Alkaline Phosphatase 70  39 - 117 U/L    Total Bilirubin 0.5  0.3 - 1.2 mg/dL    GFR calc non Af Amer 48 (*) >90 mL/min    GFR calc Af Amer 55 (*) >90 mL/min   LIPASE, BLOOD     Status: Normal   Collection Time   04/29/12  7:33 AM      Component Value Range Comment   Lipase 46  11 - 59 U/L   LACTIC ACID, PLASMA     Status: Normal   Collection Time   04/29/12  7:33 AM      Component Value Range Comment   Lactic Acid, Venous 1.7  0.5 - 2.2 mmol/L   POCT I-STAT TROPONIN I     Status: Normal   Collection Time   04/29/12  7:45 AM      Component Value Range Comment   Troponin i, poc 0.06  0.00 - 0.08 ng/mL    Comment 3            URINALYSIS, ROUTINE W REFLEX MICROSCOPIC     Status: Abnormal   Collection Time   04/29/12 10:26 AM      Component Value Range Comment   Color, Urine YELLOW  YELLOW    APPearance CLEAR  CLEAR    Specific Gravity, Urine 1.045 (*) 1.005 - 1.030    pH 8.0  5.0 - 8.0    Glucose, UA NEGATIVE  NEGATIVE mg/dL    Hgb urine dipstick NEGATIVE  NEGATIVE    Bilirubin Urine NEGATIVE  NEGATIVE    Ketones, ur NEGATIVE  NEGATIVE mg/dL    Protein, ur NEGATIVE  NEGATIVE mg/dL    Urobilinogen, UA 0.2  0.0 - 1.0 mg/dL    Nitrite NEGATIVE  NEGATIVE    Leukocytes, UA NEGATIVE  NEGATIVE MICROSCOPIC NOT DONE ON URINES  WITH NEGATIVE PROTEIN, BLOOD, LEUKOCYTES, NITRITE, OR GLUCOSE <1000 mg/dL.   Ct Abdomen Pelvis W Contrast  04/29/2012  *RADIOLOGY REPORT*  Clinical Data: Abdominal pain, epigastric pain  CT ABDOMEN AND PELVIS WITH CONTRAST  Technique:  Multidetector CT imaging of the abdomen and pelvis was performed following the standard protocol during bolus administration of intravenous contrast.  Contrast: 80mL OMNIPAQUE IOHEXOL 300 MG/ML  SOLN  Comparison: 03/03/2012 06/10/2010  Findings: There is pleural thickening and calcified pleural plaques right lower lobe anteriorly. These are stable from  06/10/2010. Nodular scarring and pleural calcification left base anteriorly. Stable.  Sagittal images of the spine shows osteopenia and degenerative changes lumbar spine.  Stable Schmorl's node deformity upper endplate of the L4 vertebral body.  Stable mild compression deformity superior endplate of L1 vertebral body.  Atherosclerotic calcifications of coronary arteries again noted.  The patient is status post CABG.  Enhanced liver shows no biliary ductal dilatation.  A cyst in inferior aspect of the left lobe measures 1.3 cm stable.  A cyst in right hepatic lobe measures 6 mm stable.  There is a hyperdense lesion in the right hepatic dome anteriorly measures 9 mm.  This is nonspecific.  May represent atypical hemangioma. Follow-up MRI is recommended for further evaluation. Nodular atelectasis or scarring left base anteriorly is stable.  The pancreas is somewhat atrophic.  Spleen is unremarkable.  Stable bilateral thickening of the adrenal glands.  Kidneys show symmetrical enhancement.  Multiple bilateral cysts again noted. The largest cyst in lower pole of the right kidney measures 2.3 cm. The largest cyst in lower pole of the left kidney measures 9 mm.  There is nonobstructive calcified calculus in lower pole of the left kidney measures 4 mm.  No hydronephrosis or hydroureter. Extensive atherosclerotic calcifications abdominal aorta, splenic artery, bilateral renal artery origin and the iliac arteries are noted.  No aortic aneurysm.  Mild distended stomach with contrast material without evidence of gastric outlet obstruction.  There are distended proximal small bowel loops with contrast material and air fluid levels.  In axial image 87 there is a left inguinal hernia containing small bowel loop.  The exiting small bowel from the hernia is smaller caliber.  Findings are consistent with a small bowel obstruction due to incarcerated left inguinal hernia.  Distal small bowel is smaller caliber.  Prostate gland  calcifications are noted.  The urinary bladder is unremarkable.  No pericecal inflammation is noted.  The terminal ileum is small caliber.  No destructive bony lesions are noted within pelvis.   IMPRESSION:  1.  Distended proximal small bowel loops are noted with multiple air-fluid levels.  In axial image 84 a small bowel loop is seen entering a small left inguinal hernia.  The exiting small bowel is smaller caliber. Findings are consistent with small bowel obstruction due to incarcerated left inguinal hernia. 2.  Mild distended stomach with fluid and oral contrast material without evidence of gastric outlet obstruction. 3.  Bilateral multiple renal cysts.  No hydronephrosis or hydroureter. 4.  Stable left renal cyst.  There is a hyper dense lesion in the right hepatic dome anteriorly measures 9 mm.  Further evaluation with non emergent MRI is recommended. 5.  Stable scarring and pleural calcifications right lower lobe anteriorly.  Stable pleural calcifications and scarring in the left lower lobe anteriorly.  Critical findings discussed with Dr. Freida Busman.   Original Report Authenticated By: Natasha Mead, M.D.        Assessment/Plan 1. Incarcerated left inguinal hernia with  secondary SBO 2. Recent MI 3. H/o MI 4. On plavix, on hold for 7 days 5. HTN 6. Aortic stenosis  Plan: 1. Admit patient for surgical intervention.  We will try to do this with local anesthesia given his cardiac history.  We will have the medicine doctors see this patient for medical management.  We had a long d/w the patient and family about risks of surgery.  The patient wishes to be a DNR if something were to happen.  Wilburta Milbourn E 04/29/2012, 11:41 AM Pager: [phone]

## 2012-04-29 NOTE — ED Provider Notes (Signed)
History     CSN: [id number]  Arrival date & time 04/29/12  9147   First MD Initiated Contact with Patient 04/29/12 [phone]      Chief Complaint  Patient presents with  . Abdominal Pain    (Consider location/radiation/quality/duration/timing/severity/associated sxs/prior treatment) Patient is a 78 y.o. male presenting with abdominal pain. The history is provided by the patient, the spouse and a relative.  Abdominal Pain The primary symptoms of the illness include abdominal pain.   patient here with abdominal discomfort described as sharp stabbing was started at his left upper quadrant and now is located in his epigastric location. Some associated nausea without vomiting. No fever or chills. No dyspnea. No chest pain or chest pressure. Has noted some urinary urgency but denies any dysuria or hematuria. Symptoms started 2 days ago has been occurring intermittently and now worse. Nothing makes the symptoms better or worse. No medications used prior to arrival.  Past Medical History  Diagnosis Date  . Hypertension   . CAD (coronary artery disease)   . Osteoarthritis   . DDD (degenerative disc disease)   . Spinal stenosis   . Heart murmur   . Shortness of breath   . Myocardial infarct     11/05/11 , 1999  . Anxiety   . Pneumonia     hx of double pneumonia   . GERD (gastroesophageal reflux disease)   . Chronic kidney disease     hxof multiple kidney stones   . Cancer     hx of skin cancery     Past Surgical History  Procedure Date  . Tonsillectomy   . Coronary artery bypass graft     1999  . Lithotripsy   . Hernia repair     bilateral 1947   . Cardiac catheterization   . Nasal septum surgery   . Cystoscopy with ureteroscopy 03/29/2012    Procedure: CYSTOSCOPY WITH URETEROSCOPY;  Surgeon: Milford Cage, MD;  Location: [facility];  Service: Urology;  Laterality: Left;    Family History  Problem Relation Age of Onset  . Cancer Father   . Coronary artery disease Father     . Stroke Mother     History  Substance Use Topics  . Smoking status: Never Smoker   . Smokeless tobacco: Never Used  . Alcohol Use: No      Review of Systems  Gastrointestinal: Positive for abdominal pain.  All other systems reviewed and are negative.    Allergies  Cocaine  Home Medications   Current Outpatient Rx  Name  Route  Sig  Dispense  Refill  . ACETAMINOPHEN 325 MG PO TABS   Oral   Take 2 tablets (650 mg total) by mouth every 4 (four) hours as needed.         . ALPRAZOLAM 0.25 MG PO TABS   Oral   Take 0.12-0.25 mg by mouth at bedtime as needed.         . AMIODARONE HCL 200 MG PO TABS   Oral   Take 200 mg by mouth every morning.         . ASPIRIN EC 81 MG PO TBEC   Oral   Take 81 mg by mouth daily.         . ATORVASTATIN CALCIUM 40 MG PO TABS   Oral   Take 40 mg by mouth at bedtime.         . OCUVITE PO TABS   Oral   Take 1 tablet  by mouth daily.          Marland Kitchen CIPROFLOXACIN HCL 500 MG PO TABS   Oral   Take 0.5 tablets (250 mg total) by mouth 2 (two) times daily.   5 tablet   0   . DOCUSATE SODIUM 100 MG PO CAPS   Oral   Take 100-200 mg by mouth 2 (two) times daily. Take one tablet in the morning and two tablets in the evening.         . FUROSEMIDE 40 MG PO TABS   Oral   Take 40 mg by mouth every morning.         . ISOSORBIDE MONONITRATE ER 60 MG PO TB24   Oral   Take 60 mg by mouth at bedtime.          Marland Kitchen LORATADINE 10 MG PO TABS   Oral   Take 10 mg by mouth daily as needed. For allergies         . OSTEO BI-FLEX ADV JOINT SHIELD PO TABS   Oral   Take 1 tablet by mouth 2 (two) times daily.         Carma Leaven M PLUS PO TABS   Oral   Take 1 tablet by mouth daily.          Marland Kitchen NITROGLYCERIN 0.4 MG SL SUBL   Sublingual   Place 0.4 mg under the tongue every 5 (five) minutes as needed. For chest pain.         Marland Kitchen OMEPRAZOLE 20 MG PO CPDR   Oral   Take 1 capsule (20 mg total) by mouth daily.         Marland Kitchen OVER THE  COUNTER MEDICATION      Herb-lax two in the am and 2 in pm         . OXYCODONE HCL 5 MG PO TABS   Oral   Take 1-2 tablets (5-10 mg total) by mouth every 4 (four) hours as needed for pain.   40 tablet   0   . POTASSIUM CHLORIDE CRYS ER 10 MEQ PO TBCR   Oral   Take 10 mEq by mouth 2 (two) times daily.           BP 155/87  Pulse 100  Temp 97.4 F (36.3 C) (Oral)  Resp 19  SpO2 95%  Physical Exam  Nursing note and vitals reviewed. Constitutional: He is oriented to person, place, and time. He appears well-developed and well-nourished.  Non-toxic appearance. No distress.  HENT:  Head: Normocephalic and atraumatic.  Eyes: Conjunctivae normal, EOM and lids are normal. Pupils are equal, round, and reactive to light.  Neck: Normal range of motion. Neck supple. No tracheal deviation present. No mass present.  Cardiovascular: Normal rate, regular rhythm and normal heart sounds.  Exam reveals no gallop.   No murmur heard. Pulmonary/Chest: Effort normal and breath sounds normal. No stridor. No respiratory distress. He has no decreased breath sounds. He has no wheezes. He has no rhonchi. He has no rales.  Abdominal: Soft. Normal appearance and bowel sounds are normal. He exhibits no distension. There is tenderness in the left lower quadrant. There is no rigidity, no rebound, no guarding and no CVA tenderness.       Left inguinal mass noted  Musculoskeletal: Normal range of motion. He exhibits no edema and no tenderness.  Neurological: He is alert and oriented to person, place, and time. He has normal strength. No cranial nerve deficit or sensory  deficit. GCS eye subscore is 4. GCS verbal subscore is 5. GCS motor subscore is 6.  Skin: Skin is warm and dry. No abrasion and no rash noted.  Psychiatric: He has a normal mood and affect. His speech is normal and behavior is normal.    ED Course  Procedures (including critical care time)   Labs Reviewed  CBC WITH DIFFERENTIAL    COMPREHENSIVE METABOLIC PANEL  LIPASE, BLOOD  LACTIC ACID, PLASMA  URINALYSIS, ROUTINE W REFLEX MICROSCOPIC  URINE CULTURE   No results found.   No diagnosis found.    MDM   Date: 04/01/2012  Rate: 100  Rhythm: sinus tachycardia  QRS Axis: normal  Intervals: QT prolonged  ST/T Wave abnormalities: nonspecific ST changes  Conduction Disutrbances:none  Narrative Interpretation:   Old EKG Reviewed: changes noted, new inferior T wave inversions  10:08 AM Patient with small bowel obstruction due 2 incarcerated left inguinal hernia. Will consult general surgery      Toy Baker, MD 04/29/12 1009

## 2012-04-29 NOTE — Transfer of Care (Signed)
Immediate Anesthesia Transfer of Care Note  Patient: Shaun [name]  Procedure(s) Performed: Procedure(s) (LRB) with comments: HERNIA REPAIR INGUINAL INCARCERATED (Left) - left incarerated inguinal hernia repair with mesh  Patient Location: PACU  Anesthesia Type:Spinal  Level of Consciousness: awake, alert  and oriented  Airway & Oxygen Therapy: Patient Spontanous Breathing and Patient connected to nasal cannula oxygen  Post-op Assessment: Report given to PACU RN and Post -op Vital signs reviewed and stable  Post vital signs: Reviewed and stable  Complications: No apparent anesthesia complications

## 2012-04-29 NOTE — Anesthesia Preprocedure Evaluation (Signed)
Anesthesia Evaluation  Patient identified by MRN, date of birth, ID band Patient awake    Reviewed: H&P , NPO status , Patient's Chart, lab work & pertinent test results  Airway Mallampati: II      Dental  (+) Teeth Intact and Dental Advisory Given   Pulmonary  breath sounds clear to auscultation        Cardiovascular Rhythm:Regular Rate:Normal + Systolic murmurs    Neuro/Psych    GI/Hepatic   Endo/Other    Renal/GU      Musculoskeletal   Abdominal   Peds  Hematology   Anesthesia Other Findings   Reproductive/Obstetrics                           Anesthesia Physical Anesthesia Plan  ASA: III  Anesthesia Plan: Spinal   Post-op Pain Management:    Induction: Intravenous  Airway Management Planned: Mask and Natural Airway  Additional Equipment:   Intra-op Plan:   Post-operative Plan:   Informed Consent: I have reviewed the patients History and Physical, chart, labs and discussed the procedure including the risks, benefits and alternatives for the proposed anesthesia with the patient or authorized representative who has indicated his/her understanding and acceptance.   Dental advisory given  Plan Discussed with: CRNA and Surgeon  Anesthesia Plan Comments: (Incarcerated left Inguinal hernia, S/P repair in remote past CAD S/P CABG 1999, IWMI 11/06/11 not felt to be a candidate for intervention Aortic stenosis moderate severity, AVA 1.24 by VTI method Cystoscopy and ureteral stent insertion 04/01/12 complicated by post-op pulmonary edema   Plan SAB with precedex for sedation  Kipp Brood, MD)        Anesthesia Quick Evaluation

## 2012-04-29 NOTE — Anesthesia Postprocedure Evaluation (Signed)
  Anesthesia Post-op Note  Patient: Shaun [name]  Procedure(s) Performed: Procedure(s) (LRB) with comments: HERNIA REPAIR INGUINAL INCARCERATED (Left) - left incarerated inguinal hernia repair with mesh  Patient Location: PACU  Anesthesia Type:Spinal  Level of Consciousness: awake, alert  and oriented  Airway and Oxygen Therapy: Patient Spontanous Breathing and Patient connected to nasal cannula oxygen  Post-op Pain: none  Post-op Assessment: Post-op Vital signs reviewed and Patient's Cardiovascular Status Stable  Post-op Vital Signs: stable  Complications: No apparent anesthesia complications

## 2012-04-29 NOTE — Consult Note (Signed)
[facility] Medical Consultation  Shaun [name] YQM:[id number] DOB: [birth year] DOA: 04/29/2012 PCP: Benita Stabile, MD   Requesting physician: Surgeon Date of consultation: 04/29/12.  Reason for consultation: Perioperative management of medical problems.   Impression/Recommendations  1. Incarcerated inguinal hernia, unilateral/Small bowel obstruction: This is patient's presenting problem. Patient is shortly en-route to the OR. Managed per primary team.  2. CAD (coronary artery disease): Patient has a known history of CAD, s/p MI 1999, s/p CABG, and an inferior MI in 10/2011. He has no complaints of shortness of breath, palpitations or chest pain at this time. As surgery is emergent, he will proceed to surgery without further cardiac work-up, although risk is at least moderate, given age and cardiovascular co-morbidities.  3. Fluid overload: Patient unfortunately, received fairly aggressive ivi fluids in the ED, and bibasal crackles are elicited on chest auscultation. Although patient is currently asymptomatic, he probably has mild early fluid overload, and has already received diuretics in the ED. IV fluids have of course, been discontinued, and portable CXR ordered. Fluid status will have to be closely monitored peri-operatively, given his recent history of pot-op pulmonary edema on 03/29/12, against a background of significant aortic stenosis.  4. Aortic stenosis: Patient has documented moderate-severe aortic stenosis. As discussed above, volume status will need to be closely monitored. Will invite cardiology team to participate in his care, during this hospitalization.  5. HTN: This appears reasonably controlled at this time.  6. Urolithiasis: S/p cystoscopy, basket extraction and left ureteral stent on 03/29/12. Stent was removed on 04/03/12, and patient is presently asymptomatic from this view point.   Comment: I will followup again tomorrow. Please contact me if I can be of  assistance in the meanwhile. Thank you for this consultation.  Chief Complaint: SBO secondary to incarcerated left inguinal hernia.   HPI:  78 year old male, with known history of HTN, CAD,s/p MI 1999, s/p CABG 1999, s/p acute inferior MI 10/2011, medical therapy recommended, moderate-severe aortic stenosis, CKD, OA, DJD, spinal stenosis, s/p bilateral inguinal hernia repair, GERD, urolithiasis, s/p cystoscopy, stone extraction and left ureteral stent 03/29/12, with post-op pulmonary edema, discharged on 03/31/12. Ureteral stent was discontinued on 04/03/12. Now admitted following abdominal pain, which started in evening of 04/28/12, and found to have partial small bowel obstruction, secondary to incarceration in a left inguinal hernia. Surgical intervention is planned, and we have been requested to manage peri-operative medical issues.   Review of Systems:  As per HPI and chief complaint. Patent denies fatigue, diminished appetite, weight loss, fever, chills, headache, blurred vision, difficulty in speaking, dysphagia, chest pain, cough, shortness of breath, orthopnea, paroxysmal nocturnal dyspnea, nausea, diaphoresis, vomiting, diarrhea, belching, heartburn, hematemesis, melena, dysuria, nocturia, urinary frequency, hematochezia, lower extremity swelling, pain, or redness. The rest of the systems review is negative.   Past Medical History  Diagnosis Date  . Hypertension   . CAD (coronary artery disease)   . Osteoarthritis   . DDD (degenerative disc disease)   . Spinal stenosis   . Heart murmur   . Shortness of breath   . Myocardial infarct     11/05/11 , 1999  . Anxiety   . Pneumonia     hx of double pneumonia   . GERD (gastroesophageal reflux disease)   . Chronic kidney disease     hxof multiple kidney stones   . Cancer     hx of skin cancery    Past Surgical History  Procedure Date  . Tonsillectomy   .  Coronary artery bypass graft     1999  . Lithotripsy   . Hernia repair      bilateral 1947   . Cardiac catheterization   . Nasal septum surgery   . Cystoscopy with ureteroscopy 03/29/2012    Procedure: CYSTOSCOPY WITH URETEROSCOPY;  Surgeon: Milford Cage, MD;  Location: [facility];  Service: Urology;  Laterality: Left;   Social History:  reports that he has never smoked. He has never used smokeless tobacco. He reports that he does not drink alcohol or use illicit drugs.  Allergies  Allergen Reactions  . Cocaine    Family History  Problem Relation Age of Onset  . Cancer Father   . Coronary artery disease Father   . Stroke Mother     Prior to Admission medications   Medication Sig Start Date End Date Taking? Authorizing Provider  ALPRAZolam (XANAX) 0.25 MG tablet Take 0.12-0.25 mg by mouth at bedtime as needed. For anxiety   Yes Historical Provider, MD  amiodarone (PACERONE) 200 MG tablet Take 200 mg by mouth every morning.   Yes Historical Provider, MD  aspirin EC 81 MG tablet Take 81 mg by mouth daily.   Yes Historical Provider, MD  atorvastatin (LIPITOR) 20 MG tablet Take 20 mg by mouth daily.   Yes Historical Provider, MD  beta carotene w/minerals (OCUVITE) tablet Take 1 tablet by mouth daily.    Yes Historical Provider, MD  clopidogrel (PLAVIX) 75 MG tablet Take 75 mg by mouth daily.   Yes Historical Provider, MD  docusate sodium (COLACE) 100 MG capsule Take 100-200 mg by mouth 2 (two) times daily. Take one tablet in the morning and two tablets in the evening.   Yes Historical Provider, MD  furosemide (LASIX) 40 MG tablet Take 40 mg by mouth every morning.   Yes Historical Provider, MD  isosorbide mononitrate (IMDUR) 60 MG 24 hr tablet Take 60 mg by mouth at bedtime.    Yes Historical Provider, MD  loratadine (CLARITIN) 10 MG tablet Take 10 mg by mouth daily as needed. For allergies   Yes Historical Provider, MD  Misc Natural Products (OSTEO BI-FLEX ADV JOINT SHIELD) TABS Take 1 tablet by mouth 2 (two) times daily.   Yes Historical Provider, MD    Multiple Vitamins-Minerals (MULTIVITAMINS THER. W/MINERALS) TABS Take 1 tablet by mouth daily.    Yes Historical Provider, MD  nitroGLYCERIN (NITROSTAT) 0.4 MG SL tablet Place 0.4 mg under the tongue every 5 (five) minutes as needed. For chest pain.   Yes Historical Provider, MD  omeprazole (PRILOSEC) 20 MG capsule Take 1 capsule (20 mg total) by mouth daily. 11/17/11  Yes Corky Crafts, MD  OVER THE COUNTER MEDICATION Herb-lax two in the am and 2 in pm   Yes Historical Provider, MD  potassium chloride (K-DUR,KLOR-CON) 10 MEQ tablet Take 10 mEq by mouth 2 (two) times daily.   Yes Historical Provider, MD   Physical Exam: Blood pressure 155/87, pulse 100, temperature 97.4 F (36.3 C), temperature source Oral, resp. rate 19, SpO2 95.00%. Filed Vitals:   04/29/12 0702  BP: 155/87  Pulse: 100  Temp: 97.4 F (36.3 C)  TempSrc: Oral  Resp: 19  SpO2: 95%    General:  Patient does not appear to be in obvious acute distress. Alert, communicative, somewhat hard of hearing, fully oriented, talking in complete sentences, not short of breath at rest.  HEENT:  No clinical pallor, no jaundice, no conjunctival injection or discharge. Hydration status is fair.  NECK:  Supple, JVP not seen, no carotid bruits, no palpable lymphadenopathy, no palpable goiter. CHEST:  Bilateral fine basal crackles, no wheeze. HEART:  Sounds 1 and 2 heard, normal, regular, systolic murmur at the apex, radiating into the carotids.  ABDOMEN:  Full, soft, non-tender, no palpable organomegaly, no palpable masses, scanty bowel sounds. GENITALIA:  Not examined. LOWER EXTREMITIES:  No pitting edema, palpable peripheral pulses. MUSCULOSKELETAL SYSTEM:  Generalized osteoarthritic changes, otherwise, normal. CENTRAL NERVOUS SYSTEM:  No focal neurologic deficit on gross examination.  Labs on Admission:  Basic Metabolic Panel:  Lab 04/29/12 1610  NA 139  K 4.8  CL 98  CO2 31  GLUCOSE 128*  BUN 28*  CREATININE 1.24   CALCIUM 10.8*  MG --  PHOS --   Liver Function Tests:  Lab 04/29/12 0733  AST 19  ALT 10  ALKPHOS 70  BILITOT 0.5  PROT 7.0  ALBUMIN 3.8    Lab 04/29/12 0733  LIPASE 46  AMYLASE --   No results found for this basename: AMMONIA:5 in the last 168 hours CBC:  Lab 04/29/12 0733  WBC 11.0*  NEUTROABS 8.5*  HGB 13.4  HCT 40.1  MCV 92.2  PLT 216   Cardiac Enzymes: No results found for this basename: CKTOTAL:5,CKMB:5,CKMBINDEX:5,TROPONINI:5 in the last 168 hours BNP: No components found with this basename: POCBNP:5 CBG: No results found for this basename: GLUCAP:5 in the last 168 hours  Radiological Exams on Admission: Ct Abdomen Pelvis W Contrast  04/29/2012  *RADIOLOGY REPORT*  Clinical Data: Abdominal pain, epigastric pain  CT ABDOMEN AND PELVIS WITH CONTRAST  Technique:  Multidetector CT imaging of the abdomen and pelvis was performed following the standard protocol during bolus administration of intravenous contrast.  Contrast: 80mL OMNIPAQUE IOHEXOL 300 MG/ML  SOLN  Comparison: 03/03/2012 06/10/2010  Findings: There is pleural thickening and calcified pleural plaques right lower lobe anteriorly. These are stable from 06/10/2010. Nodular scarring and pleural calcification left base anteriorly. Stable.  Sagittal images of the spine shows osteopenia and degenerative changes lumbar spine.  Stable Schmorl's node deformity upper endplate of the L4 vertebral body.  Stable mild compression deformity superior endplate of L1 vertebral body.  Atherosclerotic calcifications of coronary arteries again noted.  The patient is status post CABG.  Enhanced liver shows no biliary ductal dilatation.  A cyst in inferior aspect of the left lobe measures 1.3 cm stable.  A cyst in right hepatic lobe measures 6 mm stable.  There is a hyperdense lesion in the right hepatic dome anteriorly measures 9 mm.  This is nonspecific.  May represent atypical hemangioma. Follow-up MRI is recommended for further  evaluation. Nodular atelectasis or scarring left base anteriorly is stable.  The pancreas is somewhat atrophic.  Spleen is unremarkable.  Stable bilateral thickening of the adrenal glands.  Kidneys show symmetrical enhancement.  Multiple bilateral cysts again noted. The largest cyst in lower pole of the right kidney measures 2.3 cm. The largest cyst in lower pole of the left kidney measures 9 mm.  There is nonobstructive calcified calculus in lower pole of the left kidney measures 4 mm.  No hydronephrosis or hydroureter. Extensive atherosclerotic calcifications abdominal aorta, splenic artery, bilateral renal artery origin and the iliac arteries are noted.  No aortic aneurysm.  Mild distended stomach with contrast material without evidence of gastric outlet obstruction.  There are distended proximal small bowel loops with contrast material and air fluid levels.  In axial image 87 there is a left inguinal hernia containing small  bowel loop.  The exiting small bowel from the hernia is smaller caliber.  Findings are consistent with a small bowel obstruction due to incarcerated left inguinal hernia.  Distal small bowel is smaller caliber.  Prostate gland calcifications are noted.  The urinary bladder is unremarkable.  No pericecal inflammation is noted.  The terminal ileum is small caliber.  No destructive bony lesions are noted within pelvis.   IMPRESSION:  1.  Distended proximal small bowel loops are noted with multiple air-fluid levels.  In axial image 84 a small bowel loop is seen entering a small left inguinal hernia.  The exiting small bowel is smaller caliber. Findings are consistent with small bowel obstruction due to incarcerated left inguinal hernia. 2.  Mild distended stomach with fluid and oral contrast material without evidence of gastric outlet obstruction. 3.  Bilateral multiple renal cysts.  No hydronephrosis or hydroureter. 4.  Stable left renal cyst.  There is a hyper dense lesion in the right hepatic  dome anteriorly measures 9 mm.  Further evaluation with non emergent MRI is recommended. 5.  Stable scarring and pleural calcifications right lower lobe anteriorly.  Stable pleural calcifications and scarring in the left lower lobe anteriorly.  Critical findings discussed with Dr. Freida Busman.   Original Report Authenticated By: Natasha Mead, M.D.     EKG: Independently reviewed: Shows SR, regular, old anterior Q-waves, but no acute ischemic changes.   Time spent: 40 mins.   Hatsue Sime,CHRISTOPHER [facility] Pager [phone].   If 7PM-7AM, please contact night-coverage [email or web address] Password [email or web address] 04/29/2012, 12:19 PM

## 2012-04-29 NOTE — Anesthesia Procedure Notes (Addendum)
Anesthesia Regional Block:   Narrative:    Spinal  Patient location during procedure: OR Start time: 04/29/2012 12:57 PM End time: 04/29/2012 1:07 PM Staffing Performed by: anesthesiologist  Preanesthetic Checklist Completed: patient identified, site marked, surgical consent, pre-op evaluation, timeout performed, IV checked, risks and benefits discussed and monitors and equipment checked Spinal Block Patient position: right lateral decubitus Prep: Betadine Patient monitoring: heart rate, cardiac monitor, continuous pulse ox and blood pressure Approach: right paramedian Location: L4-5 Injection technique: single-shot Needle Needle gauge: 22 G Needle length: 9 cm Assessment Sensory level: T12 Additional Notes 6 mg 2% Bupivicaine injected easily, clear CSF. Multiple attempts at L3-4 and L4-5 prior successful entry into subarachmoid space. Well tolerated.  Kipp Brood, MD

## 2012-04-29 NOTE — ED Notes (Signed)
Pt reports epigastric pain radiating to the LUQ pain since yesterday.  Reports nausea, denies vomiting.  Pain intermittent-reports movement increases pain.  No difficulty with BM.  Pt appears anxious, pt reassured and family allowed to be at bedside.  No distress noted.  Pt denies CP.

## 2012-04-29 NOTE — Consult Note (Signed)
CARDIOLOGY CONSULT NOTE    Patient ID: Shaun [name] MRN: [id number] DOB/AGE: 76-Jan-1916 76 y.o.  Admit date: 04/29/2012 Referring Physician:  Brien Few Primary Physician: Benita Stabile, MD Primary Cardiologist:  Jim Like Reason for Consultation:  AS CHF CAD  Principal Problem:  *Incarcerated inguinal hernia, unilateral Active Problems:  Small bowel obstruction  CAD (coronary artery disease)   HPI:   78 yo DNR seen in ER with strangulated left inguinal hernia.  Cleared for emergent procedure by medicine.  Ask to see to help management.  He had urologic procedure 03/29/12 with resultant pulmonary edema.  Echo done 11/08/11 showed moderate AS with mean gradient of 14 mmHg and peak of 25 mmHg  EF estimated at 40-45%  Was admitted On 5/17 with IMI and not cathed Rx conservatively Distant CABG in 1999.  Post op he has no angina BP a bit low requiring low dose neosynephrine.  No complications during  surgery  @ROS @ All other systems reviewed and negative except as noted above  Past Medical History  Diagnosis Date  . Hypertension   . CAD (coronary artery disease)   . Osteoarthritis   . DDD (degenerative disc disease)   . Spinal stenosis   . Heart murmur   . Shortness of breath   . Myocardial infarct     11/05/11 , 1999  . Anxiety   . Pneumonia     hx of double pneumonia   . GERD (gastroesophageal reflux disease)   . Chronic kidney disease     hxof multiple kidney stones   . Cancer     hx of skin cancery     Family History  Problem Relation Age of Onset  . Cancer Father   . Coronary artery disease Father   . Stroke Mother     History   Social History  . Marital Status: Married    Spouse Name: N/A    Number of Children: N/A  . Years of Education: N/A   Occupational History  . Not on file.   Social History Main Topics  . Smoking status: Never Smoker   . Smokeless tobacco: Never Used  . Alcohol Use: No  . Drug Use: No  . Sexually Active: Not Currently    Other Topics Concern  . Not on file   Social History Narrative  . No narrative on file    Past Surgical History  Procedure Date  . Tonsillectomy   . Coronary artery bypass graft     1999  . Lithotripsy   . Hernia repair     bilateral 1947   . Cardiac catheterization   . Nasal septum surgery   . Cystoscopy with ureteroscopy 03/29/2012    Procedure: CYSTOSCOPY WITH URETEROSCOPY;  Surgeon: Milford Cage, MD;  Location: [facility];  Service: Urology;  Laterality: Left;        . albumin human  12.5 g Intravenous Once  . [COMPLETED] albumin human  12.5 g Intravenous Once  . [facility] HOLD] furosemide  40 mg Intravenous Daily  . [COMPLETED] furosemide  40 mg Oral Once  . [COMPLETED] iohexol  20 mL Oral Q1 Hr x 2  . [COMPLETED] ondansetron  4 mg Intravenous Once      . dexmedetomidine    . [DISCONTINUED] sodium chloride Stopped (04/29/12 0855)    Physical Exam: Blood pressure 97/54, pulse 62, temperature 96.9 F (36.1 C), temperature source Oral, resp. rate 19, height 5' 6.14" (1.68 m), weight 151 lb 3.8 oz (68.6 kg), SpO2 94.00%.  Affect appropriate Elderly male in PACU HEENT: normal Neck supple with no adenopathy JVP normal no bruits no thyromegaly Lungs clear with no wheezing and good diaphragmatic motion Heart:  S1/S2 AS murmur, no rub, gallop or click PMI enlarged Abdomen: benighn, BS positve, no tenderness, no AAA left inguinal hernia repair scar no bruit.  No HSM or HJR Distal pulses intact with no bruits No edema Neuro non-focal Skin warm and dry No muscular weakness   Labs:   Lab Results  Component Value Date   WBC 11.0* 04/29/2012   HGB 13.4 04/29/2012   HCT 40.1 04/29/2012   MCV 92.2 04/29/2012   PLT 216 04/29/2012    Lab 04/29/12 0733  NA 139  K 4.8  CL 98  CO2 31  BUN 28*  CREATININE 1.24  CALCIUM 10.8*  PROT 7.0  BILITOT 0.5  ALKPHOS 70  ALT 10  AST 19  GLUCOSE 128*   Lab Results  Component Value Date   CKTOTAL 320* 11/09/2011    CKMB 20.6* 11/09/2011   TROPONINI 9.25* 11/09/2011    Lab Results  Component Value Date   CHOL 160 11/06/2011   Lab Results  Component Value Date   HDL 49 11/06/2011   Lab Results  Component Value Date   LDLCALC 89 11/06/2011   Lab Results  Component Value Date   TRIG 111 11/06/2011   Lab Results  Component Value Date   CHOLHDL 3.3 11/06/2011   No results found for this basename: LDLDIRECT      Radiology: Ct Abdomen Pelvis W Contrast  04/29/2012  *RADIOLOGY REPORT*  Clinical Data: Abdominal pain, epigastric pain  CT ABDOMEN AND PELVIS WITH CONTRAST  Technique:  Multidetector CT imaging of the abdomen and pelvis was performed following the standard protocol during bolus administration of intravenous contrast.  Contrast: 80mL OMNIPAQUE IOHEXOL 300 MG/ML  SOLN  Comparison: 03/03/2012 06/10/2010  Findings: There is pleural thickening and calcified pleural plaques right lower lobe anteriorly. These are stable from 06/10/2010. Nodular scarring and pleural calcification left base anteriorly. Stable.  Sagittal images of the spine shows osteopenia and degenerative changes lumbar spine.  Stable Schmorl's node deformity upper endplate of the L4 vertebral body.  Stable mild compression deformity superior endplate of L1 vertebral body.  Atherosclerotic calcifications of coronary arteries again noted.  The patient is status post CABG.  Enhanced liver shows no biliary ductal dilatation.  A cyst in inferior aspect of the left lobe measures 1.3 cm stable.  A cyst in right hepatic lobe measures 6 mm stable.  There is a hyperdense lesion in the right hepatic dome anteriorly measures 9 mm.  This is nonspecific.  May represent atypical hemangioma. Follow-up MRI is recommended for further evaluation. Nodular atelectasis or scarring left base anteriorly is stable.  The pancreas is somewhat atrophic.  Spleen is unremarkable.  Stable bilateral thickening of the adrenal glands.  Kidneys show symmetrical enhancement.   Multiple bilateral cysts again noted. The largest cyst in lower pole of the right kidney measures 2.3 cm. The largest cyst in lower pole of the left kidney measures 9 mm.  There is nonobstructive calcified calculus in lower pole of the left kidney measures 4 mm.  No hydronephrosis or hydroureter. Extensive atherosclerotic calcifications abdominal aorta, splenic artery, bilateral renal artery origin and the iliac arteries are noted.  No aortic aneurysm.  Mild distended stomach with contrast material without evidence of gastric outlet obstruction.  There are distended proximal small bowel loops with contrast material and air fluid  levels.  In axial image 87 there is a left inguinal hernia containing small bowel loop.  The exiting small bowel from the hernia is smaller caliber.  Findings are consistent with a small bowel obstruction due to incarcerated left inguinal hernia.  Distal small bowel is smaller caliber.  Prostate gland calcifications are noted.  The urinary bladder is unremarkable.  No pericecal inflammation is noted.  The terminal ileum is small caliber.  No destructive bony lesions are noted within pelvis.   IMPRESSION:  1.  Distended proximal small bowel loops are noted with multiple air-fluid levels.  In axial image 84 a small bowel loop is seen entering a small left inguinal hernia.  The exiting small bowel is smaller caliber. Findings are consistent with small bowel obstruction due to incarcerated left inguinal hernia. 2.  Mild distended stomach with fluid and oral contrast material without evidence of gastric outlet obstruction. 3.  Bilateral multiple renal cysts.  No hydronephrosis or hydroureter. 4.  Stable left renal cyst.  There is a hyper dense lesion in the right hepatic dome anteriorly measures 9 mm.  Further evaluation with non emergent MRI is recommended. 5.  Stable scarring and pleural calcifications right lower lobe anteriorly.  Stable pleural calcifications and scarring in the left lower  lobe anteriorly.  Critical findings discussed with Dr. Freida Busman.   Original Report Authenticated By: Natasha Mead, M.D.    Dg Chest Port 1 View  04/29/2012  *RADIOLOGY REPORT*  Clinical Data: fluid overload  PORTABLE CHEST - 1 VIEW  Comparison: 04/11/2012  Findings: Cardiomediastinal silhouette is stable.  Status post CABG.  No acute infiltrate or pulmonary edema is stable bilateral basilar scarring.  Stable scarring in the lingula.  No pleural effusion.  Again noted mild elevation of the right hemidiaphragm.  IMPRESSION: No active disease.  Stable bilateral basilar scarring.   Original Report Authenticated By: Natasha Mead, M.D.     EKG:  SR rate 100 old IMI from May    ASSESSMENT AND PLAN:  CAD:  Stable no chest pain.  IMI May not cathed do to advanced age and DNR status AS: moderate but puts him at risk for recurrent CHF with CAD and EF 40-45%  Lasix for I >O more than 500cc  Currently needed some volume for BP and to wean neosynephrine Hernia:  Successful surgery with no immediate complications.    SignedCharlton Haws 04/29/2012, 3:58 PM

## 2012-04-30 ENCOUNTER — Inpatient Hospital Stay (HOSPITAL_COMMUNITY): Payer: MEDICARE

## 2012-04-30 LAB — COMPREHENSIVE METABOLIC PANEL
ALT: 8 U/L (ref 0–53)
AST: 16 U/L (ref 0–37)
Albumin: 3.4 g/dL — ABNORMAL LOW (ref 3.5–5.2)
Alkaline Phosphatase: 62 U/L (ref 39–117)
CO2: 27 mEq/L (ref 19–32)
Chloride: 100 mEq/L (ref 96–112)
Creatinine, Ser: 1.03 mg/dL (ref 0.50–1.35)
GFR calc non Af Amer: 60 mL/min — ABNORMAL LOW (ref >90)
Potassium: 4.3 mEq/L (ref 3.5–5.1)
Total Bilirubin: 0.5 mg/dL (ref 0.3–1.2)

## 2012-04-30 LAB — CBC
MCV: 93.5 fL (ref 78.0–100.0)
Platelets: 172 10*3/uL (ref 150–400)
RBC: 4.17 MIL/uL — ABNORMAL LOW (ref 4.22–5.81)
RDW: 15.2 % (ref 11.5–15.5)
WBC: 9.3 10*3/uL (ref 4.0–10.5)

## 2012-04-30 LAB — URINE CULTURE: Culture: NO GROWTH

## 2012-04-30 MED ORDER — AMIODARONE HCL 200 MG PO TABS
200.0000 mg | ORAL_TABLET | Freq: Every morning | ORAL | Status: DC
  Administered 2012-05-01: 200 mg via ORAL
  Filled 2012-04-30: qty 1

## 2012-04-30 MED ORDER — ISOSORBIDE MONONITRATE ER 60 MG PO TB24
60.0000 mg | ORAL_TABLET | Freq: Every day | ORAL | Status: DC
  Administered 2012-05-01: 60 mg via ORAL
  Filled 2012-04-30 (×2): qty 1

## 2012-04-30 MED ORDER — FUROSEMIDE 40 MG PO TABS
40.0000 mg | ORAL_TABLET | Freq: Every morning | ORAL | Status: DC
  Administered 2012-05-01: 40 mg via ORAL
  Filled 2012-04-30: qty 1

## 2012-04-30 MED ORDER — POTASSIUM CHLORIDE CRYS ER 10 MEQ PO TBCR
10.0000 meq | EXTENDED_RELEASE_TABLET | Freq: Two times a day (BID) | ORAL | Status: DC
  Administered 2012-05-01 (×2): 10 meq via ORAL
  Filled 2012-04-30 (×3): qty 1

## 2012-04-30 MED ORDER — LORATADINE 10 MG PO TABS
10.0000 mg | ORAL_TABLET | Freq: Every day | ORAL | Status: DC | PRN
  Filled 2012-04-30: qty 1

## 2012-04-30 MED ORDER — PANTOPRAZOLE SODIUM 40 MG PO TBEC
40.0000 mg | DELAYED_RELEASE_TABLET | Freq: Every day | ORAL | Status: DC
  Administered 2012-05-01: 40 mg via ORAL
  Filled 2012-04-30: qty 1

## 2012-04-30 MED ORDER — ATORVASTATIN CALCIUM 20 MG PO TABS
20.0000 mg | ORAL_TABLET | Freq: Every day | ORAL | Status: DC
  Administered 2012-05-01: 20 mg via ORAL
  Filled 2012-04-30: qty 1

## 2012-04-30 MED ORDER — ALPRAZOLAM 0.25 MG PO TABS: 0.1200 mg | ORAL_TABLET | Freq: Every evening | ORAL | Status: DC | PRN

## 2012-04-30 MED ORDER — DOCUSATE SODIUM 100 MG PO CAPS
100.0000 mg | ORAL_CAPSULE | Freq: Two times a day (BID) | ORAL | Status: DC
  Administered 2012-05-01: 100 mg via ORAL
  Administered 2012-05-01: 200 mg via ORAL
  Filled 2012-04-30: qty 2
  Filled 2012-04-30: qty 1

## 2012-04-30 NOTE — Progress Notes (Signed)
1 Day Post-Op  Subjective: Doing well POD 1. Tolerated clear liquids for breakfast.  Neo gtt off since last night.    Objective: Vital signs in last 24 hours: Temp:  [96.9 F (36.1 C)-98.4 F (36.9 C)] 97.5 F (36.4 C) (11/10 0759) Pulse Rate:  [58-103] 94  (11/10 1000) Resp:  [3-28] 28  (11/10 1000) BP: (71-124)/(46-76) 113/66 mmHg (11/10 1000) SpO2:  [88 %-100 %] 97 % (11/10 1000) Weight:  [151 lb 3.8 oz (68.6 kg)] 151 lb 3.8 oz (68.6 kg) (11/09 1100)    Intake/Output from previous day: 11/09 0701 - 11/10 0700 In: 581.5 [I.V.:531.5; IV Piggyback:50] Out: 1395 [Urine:1395] Intake/Output this shift: Total I/O In: 484 [P.O.:480; IV Piggyback:4] Out: 325 [Urine:325]  General appearance: alert, cooperative and no distress Resp: breathing comfortably.   GI: soft, incision c/d/i Extremities: extremities normal, atraumatic, no cyanosis or edema  Lab Results:   [facility] 04/30/12 0435 04/29/12 0733  WBC 9.3 11.0*  HGB 12.8* 13.4  HCT 39.0 40.1  PLT 172 216   BMET  Basename 04/30/12 0435 04/29/12 0733  NA 137 139  K 4.3 4.8  CL 100 98  CO2 27 31  GLUCOSE 92 128*  BUN 20 28*  CREATININE 1.03 1.24  CALCIUM 10.0 10.8*   PT/INR No results found for this basename: LABPROT:2,INR:2 in the last 72 hours ABG No results found for this basename: PHART:2,PCO2:2,PO2:2,HCO3:2 in the last 72 hours  Studies/Results: Ct Abdomen Pelvis W Contrast  04/29/2012  *RADIOLOGY REPORT*  Clinical Data: Abdominal pain, epigastric pain  CT ABDOMEN AND PELVIS WITH CONTRAST  Technique:  Multidetector CT imaging of the abdomen and pelvis was performed following the standard protocol during bolus administration of intravenous contrast.  Contrast: 80mL OMNIPAQUE IOHEXOL 300 MG/ML  SOLN  Comparison: 03/03/2012 06/10/2010  Findings: There is pleural thickening and calcified pleural plaques right lower lobe anteriorly. These are stable from 06/10/2010. Nodular scarring and pleural calcification left base  anteriorly. Stable.  Sagittal images of the spine shows osteopenia and degenerative changes lumbar spine.  Stable Schmorl's node deformity upper endplate of the L4 vertebral body.  Stable mild compression deformity superior endplate of L1 vertebral body.  Atherosclerotic calcifications of coronary arteries again noted.  The patient is status post CABG.  Enhanced liver shows no biliary ductal dilatation.  A cyst in inferior aspect of the left lobe measures 1.3 cm stable.  A cyst in right hepatic lobe measures 6 mm stable.  There is a hyperdense lesion in the right hepatic dome anteriorly measures 9 mm.  This is nonspecific.  May represent atypical hemangioma. Follow-up MRI is recommended for further evaluation. Nodular atelectasis or scarring left base anteriorly is stable.  The pancreas is somewhat atrophic.  Spleen is unremarkable.  Stable bilateral thickening of the adrenal glands.  Kidneys show symmetrical enhancement.  Multiple bilateral cysts again noted. The largest cyst in lower pole of the right kidney measures 2.3 cm. The largest cyst in lower pole of the left kidney measures 9 mm.  There is nonobstructive calcified calculus in lower pole of the left kidney measures 4 mm.  No hydronephrosis or hydroureter. Extensive atherosclerotic calcifications abdominal aorta, splenic artery, bilateral renal artery origin and the iliac arteries are noted.  No aortic aneurysm.  Mild distended stomach with contrast material without evidence of gastric outlet obstruction.  There are distended proximal small bowel loops with contrast material and air fluid levels.  In axial image 87 there is a left inguinal hernia containing small bowel loop.  The exiting small bowel from the hernia is smaller caliber.  Findings are consistent with a small bowel obstruction due to incarcerated left inguinal hernia.  Distal small bowel is smaller caliber.  Prostate gland calcifications are noted.  The urinary bladder is unremarkable.  No  pericecal inflammation is noted.  The terminal ileum is small caliber.  No destructive bony lesions are noted within pelvis.   IMPRESSION:  1.  Distended proximal small bowel loops are noted with multiple air-fluid levels.  In axial image 84 a small bowel loop is seen entering a small left inguinal hernia.  The exiting small bowel is smaller caliber. Findings are consistent with small bowel obstruction due to incarcerated left inguinal hernia. 2.  Mild distended stomach with fluid and oral contrast material without evidence of gastric outlet obstruction. 3.  Bilateral multiple renal cysts.  No hydronephrosis or hydroureter. 4.  Stable left renal cyst.  There is a hyper dense lesion in the right hepatic dome anteriorly measures 9 mm.  Further evaluation with non emergent MRI is recommended. 5.  Stable scarring and pleural calcifications right lower lobe anteriorly.  Stable pleural calcifications and scarring in the left lower lobe anteriorly.  Critical findings discussed with Dr. Freida Busman.   Original Report Authenticated By: Natasha Mead, M.D.    Dg Chest Port 1 View  04/30/2012  *RADIOLOGY REPORT*  Clinical Data: CHF.  PORTABLE CHEST - 1 VIEW  Comparison: 04/29/2012  Findings: The patient has had median sternotomy CABG.  Heart is enlarged.  There is developing infiltrate in the right lung.  Scar or atelectasis again identified at the left lung base.  There is prominence of interstitial markings, likely chronic.  Elevation of the right hemidiaphragm appears chronic and stable.  IMPRESSION: Developing infiltrate in the right lower lobe.   Original Report Authenticated By: Norva Pavlov, M.D.    Dg Chest Port 1 View  04/29/2012  *RADIOLOGY REPORT*  Clinical Data: fluid overload  PORTABLE CHEST - 1 VIEW  Comparison: 04/11/2012  Findings: Cardiomediastinal silhouette is stable.  Status post CABG.  No acute infiltrate or pulmonary edema is stable bilateral basilar scarring.  Stable scarring in the lingula.  No pleural  effusion.  Again noted mild elevation of the right hemidiaphragm.  IMPRESSION: No active disease.  Stable bilateral basilar scarring.   Original Report Authenticated By: Natasha Mead, M.D.     Anti-infectives: Anti-infectives     Start     Dose/Rate Route Frequency Ordered Stop   04/30/12 0600   ceFAZolin (ANCEF) IVPB 2 g/50 mL premix        2 g 100 mL/hr over 30 Minutes Intravenous On call to O.R. 04/29/12 1827 04/30/12 0713          Assessment/Plan: s/p Procedure(s) (LRB) with comments: HERNIA REPAIR INGUINAL INCARCERATED (Left) - left incarerated inguinal hernia repair with mesh d/c foley Advance diet Plan for discharge tomorrow transfer to floor Continue to hold plavix and aspirin in anticipation of back injection.    LOS: 1 day    [facility] 04/30/2012

## 2012-04-30 NOTE — Progress Notes (Signed)
Pt arrived to dept 6 [place] via wheelchair, accompanied by Morrie Sheldon, Charity fundraiser.  Pt transferred to bed with minimal assistance. VSS.  Family at bedside.  Oriented to room, dept, call bell.  No complaints at this time.  Will cont to monitor. Sol Blazing Ward

## 2012-05-01 ENCOUNTER — Encounter (HOSPITAL_COMMUNITY): Payer: Self-pay | Admitting: General Surgery

## 2012-05-01 DIAGNOSIS — I2510 Atherosclerotic heart disease of native coronary artery without angina pectoris: Secondary | ICD-10-CM

## 2012-05-01 DIAGNOSIS — I2119 ST elevation (STEMI) myocardial infarction involving other coronary artery of inferior wall: Secondary | ICD-10-CM

## 2012-05-01 DIAGNOSIS — I7090 Unspecified atherosclerosis: Secondary | ICD-10-CM

## 2012-05-01 DIAGNOSIS — I5021 Acute systolic (congestive) heart failure: Secondary | ICD-10-CM

## 2012-05-01 MED ORDER — ASPIRIN 81 MG PO CHEW: 81.0000 mg | CHEWABLE_TABLET | Freq: Every day | ORAL | Status: DC

## 2012-05-01 MED ORDER — ISOSORBIDE MONONITRATE ER 30 MG PO TB24
30.0000 mg | ORAL_TABLET | Freq: Every day | ORAL | Status: DC
Start: 2012-05-01 — End: 2012-05-01
  Filled 2012-05-01: qty 1

## 2012-05-01 MED ORDER — CLOPIDOGREL BISULFATE 75 MG PO TABS: 75.0000 mg | ORAL_TABLET | Freq: Every day | ORAL | Status: DC

## 2012-05-01 MED ORDER — HYDROCODONE-ACETAMINOPHEN 5-325 MG PO TABS: 1.0000 | ORAL_TABLET | ORAL | Status: DC | PRN

## 2012-05-01 NOTE — Discharge Summary (Signed)
Patient ID: Shaun [name] MRN: [id number] DOB/AGE: May 20, 1915 78 y.o.  Admit date: 04/29/2012 Discharge date: 05/01/2012  Procedures: open left inguinal hernia repair with mesh  Consults: internal medicine  Reason for Admission: This is a 78 yo male who presented to [facility] with an incarcerated left inguinal hernia.  It was unable to be reduced and he was admitted for further care  Admission Diagnoses:  1. Incarcerated left inguinal hernia Patient Active Problem List  Diagnosis  . SOB (shortness of breath)  . HTN (hypertension)  . Arteriosclerotic cardiovascular disease (ASCVD)  . Aortic stenosis  . Acute MI, inferior wall  . Hyperlipidemia  . Acute systolic heart failure  . Incarcerated inguinal hernia, unilateral  . Small bowel obstruction  . CAD (coronary artery disease)    Hospital Course: the patient was admitted and taken to the operating room where he underwent a left inguinal hernia repair with mesh under a spinal and local anesthesia given his cardiac history.  The patient tolerated this well except he became hypotensive and required a small amount of pressure support.  He was sent to ICU until his pressor drip could be weaned off.  This happened very quickly and was able to be moved out of the unit on POD#1.  His diet was advanced as tolerated and his pain was well controlled.  He was able to be dc on POD# 2. His other medical problems remained stable during this admission.  Discharge Diagnoses:  Principal Problem:  *Incarcerated inguinal hernia, unilateral Active Problems:  Small bowel obstruction  CAD (coronary artery disease) s/p left inguinal hernia repair with mesh  Discharge Medications:   Medication List     As of 05/01/2012  2:04 PM    STOP taking these medications         clopidogrel 75 MG tablet   Commonly known as: PLAVIX, resume after steroid injection per Dr. Newell Coral      TAKE these medications         ALPRAZolam 0.25 MG tablet   Commonly  known as: XANAX   Take 0.12-0.25 mg by mouth at bedtime as needed. For anxiety      amiodarone 200 MG tablet   Commonly known as: PACERONE   Take 200 mg by mouth every morning.      aspirin EC 81 MG tablet   Take 81 mg by mouth daily.      atorvastatin 20 MG tablet   Commonly known as: LIPITOR   Take 20 mg by mouth daily.      docusate sodium 100 MG capsule   Commonly known as: COLACE   Take 100-200 mg by mouth 2 (two) times daily. Take one tablet in the morning and two tablets in the evening.      furosemide 40 MG tablet   Commonly known as: LASIX   Take 40 mg by mouth every morning.      HYDROcodone-acetaminophen 5-325 MG per tablet   Commonly known as: NORCO/VICODIN   Take 1-2 tablets by mouth every 4 (four) hours as needed.      isosorbide mononitrate 60 MG 24 hr tablet   Commonly known as: IMDUR   Take 60 mg by mouth at bedtime.      loratadine 10 MG tablet   Commonly known as: CLARITIN   Take 10 mg by mouth daily as needed. For allergies      multivitamins ther. w/minerals Tabs   Take 1 tablet by mouth daily.      beta  carotene w/minerals tablet   Take 1 tablet by mouth daily.      nitroGLYCERIN 0.4 MG SL tablet   Commonly known as: NITROSTAT   Place 0.4 mg under the tongue every 5 (five) minutes as needed. For chest pain.      omeprazole 20 MG capsule   Commonly known as: PRILOSEC   Take 1 capsule (20 mg total) by mouth daily.      Osteo Bi-Flex Adv Joint Shield Tabs   Take 1 tablet by mouth 2 (two) times daily.      OVER THE COUNTER MEDICATION   Herb-lax two in the am and 2 in pm      potassium chloride 10 MEQ tablet   Commonly known as: K-DUR,KLOR-CON   Take 10 mEq by mouth 2 (two) times daily.        Discharge Instructions:     Follow-up Information    Follow up with Lajean Saver, MD. Schedule an appointment as soon as possible for a visit in 3 weeks.   Contact information:   [address] [place]  [address] [phone]       Follow up with Hewitt Shorts, MD.   Contact information:   [address] [place] [address] [phone]          Signed: Letha Cape 05/01/2012, 2:04 PM

## 2012-05-01 NOTE — Progress Notes (Signed)
INITIAL ADULT NUTRITION ASSESSMENT Date: 05/01/2012   Time: 10:31 AM  Reason for Assessment: Nutrition Risk (MST=3)  INTERVENTION:  Recommend patient consume Ensure Plus BID at home to help ensure adequate PO intake for weight maintenance.  DOCUMENTATION CODES Per approved criteria  -Not Applicable   ASSESSMENT: Male 78 y.o.  Dx: Incarcerated inguinal hernia, unilateral; S/P Left femoral hernia repair with mesh on 11/9  Hx:  Past Medical History  Diagnosis Date  . Hypertension   . CAD (coronary artery disease)   . Osteoarthritis   . DDD (degenerative disc disease)   . Spinal stenosis   . Heart murmur   . Shortness of breath   . Myocardial infarct     11/05/11 , 1999  . Anxiety   . Pneumonia     hx of double pneumonia   . GERD (gastroesophageal reflux disease)   . Chronic kidney disease     hxof multiple kidney stones   . Cancer     hx of skin cancery     Past Surgical History  Procedure Date  . Tonsillectomy   . Coronary artery bypass graft     1999  . Lithotripsy   . Hernia repair     bilateral 1947   . Cardiac catheterization   . Nasal septum surgery   . Cystoscopy with ureteroscopy 03/29/2012    Procedure: CYSTOSCOPY WITH URETEROSCOPY;  Surgeon: Milford Cage, MD;  Location: [facility];  Service: Urology;  Laterality: Left;    Related Meds:  Scheduled Meds:   . amiodarone  200 mg Oral q morning - 10a  . atorvastatin  20 mg Oral Daily  . docusate sodium  100-200 mg Oral BID  . furosemide  40 mg Intravenous Daily  . furosemide  40 mg Oral q morning - 10a  . isosorbide mononitrate  60 mg Oral QHS  . pantoprazole  40 mg Oral Daily  . potassium chloride  10 mEq Oral BID   Continuous Infusions:   . phenylephrine (NEO-SYNEPHRINE) Adult infusion Stopped (04/29/12 2130)   PRN Meds:.ALPRAZolam, HYDROcodone-acetaminophen, HYDROmorphone (DILAUDID) injection, loratadine, nitroGLYCERIN, ondansetron   Ht: 5' 6.14" (168 cm)  Wt: 151 lb 3.8 oz (68.6  kg)  Ideal Wt: 64.5 kg % Ideal Wt: 106%  Usual Wt:  Wt Readings from Last 10 Encounters:  04/29/12 151 lb 3.8 oz (68.6 kg)  04/29/12 151 lb 3.8 oz (68.6 kg)  03/31/12 151 lb 3.8 oz (68.6 kg)  03/31/12 151 lb 3.8 oz (68.6 kg)  03/27/12 149 lb 14.4 oz (67.994 kg)  11/05/11 167 lb 1.7 oz (75.8 kg)  05/31/11 173 lb 8 oz (78.7 kg)   % Usual Wt: 90%  Body mass index is 24.31 kg/(m^2).  Food/Nutrition Related Hx: 10% weight loss in the past 6 months  Labs:  CMP     Component Value Date/Time   NA 137 04/30/2012 0435   K 4.3 04/30/2012 0435   CL 100 04/30/2012 0435   CO2 27 04/30/2012 0435   GLUCOSE 92 04/30/2012 0435   BUN 20 04/30/2012 0435   CREATININE 1.03 04/30/2012 0435   CALCIUM 10.0 04/30/2012 0435   PROT 6.2 04/30/2012 0435   ALBUMIN 3.4* 04/30/2012 0435   AST 16 04/30/2012 0435   ALT 8 04/30/2012 0435   ALKPHOS 62 04/30/2012 0435   BILITOT 0.5 04/30/2012 0435   GFRNONAA 60* 04/30/2012 0435   GFRAA 69* 04/30/2012 0435     Intake/Output Summary (Last 24 hours) at 05/01/12 1034 Last data filed at  05/01/12 0900  Gross per 24 hour  Intake    480 ml  Output    100 ml  Net    380 ml     Diet Order: Heart Healthy  IVF:    phenylephrine (NEO-SYNEPHRINE) Adult infusion Last Rate: Stopped (04/29/12 2130)    Estimated Nutritional Needs:   Kcal: 1600-1800 Protein: 85-95 gm Fluid: 1.6-1.8 liters  Patient reports weight loss since May 2013 due to poor PO intake since heart attack at that time.  Good intake here in the hospital.  Patient reports that he should be leaving today.  Has not tried Ensure in the past, but may try it when he leaves the hospital.    NUTRITION DIAGNOSIS: Unintentional weight loss related to poor appetite with advanced age and recent heart attack as evidenced by 10% unintentional weight loss in the past 6 months.  MONITORING/EVALUATION(Goals): Goal:  Intake to meet >90% of estimated needs. Monitor:  Weight trend, PO intake,  labs.  EDUCATION NEEDS: -Education needs addressed--discussed ways to maximize oral intake after discharge to support weight maintenance.   Joaquin Courts, RD, LDN, CNSC Pager# [phone] After Hours Pager# [phone]  05/01/2012, 10:31 AM

## 2012-05-01 NOTE — Progress Notes (Signed)
Discharge home.

## 2012-05-01 NOTE — Progress Notes (Signed)
[facility]                                                                                Patient Demographics  Shaun [name], is a 78 y.o. male  ZOX:[id number]  [id number]:[id number]  DOB - 1915-04-18  Admit date - 04/29/2012  Admitting Physician Axel Filler, MD  Outpatient Primary MD for the patient is Benita Stabile, MD  LOS - 2   Chief Complaint  Patient presents with  . Abdominal Pain        Assessment & Plan    1. Incarcerated inguinal hernia, unilateral/Small bowel obstruction: This is patient's presenting problem. Post op, passing flatus, per surgery. Resume ASA-Plavix on 05-02-12 if OK per surgery.      2. CAD - AS (moderate to severe): Patient has a known history of CAD, s/p MI 1999, s/p CABG, and an inferior MI in 10/2011. He has no complaints of shortness of breath, palpitations or chest pain at this time.Stable and symtom free, cards following.     3. Fluid overload: resolved, hold IVF once eating, PRN lasix.    4. HTN: This appears reasonably controlled at this time. Present Meds to continue.    5. Urolithiasis: S/p cystoscopy, basket extraction and left ureteral stent on 03/29/12. Stent was removed on 04/03/12, and patient is presently asymptomatic from this view point.      Time Spent in minutes   35   Antibiotics   Anti-infectives     Start     Dose/Rate Route Frequency Ordered Stop   04/30/12 0600   ceFAZolin (ANCEF) IVPB 2 g/50 mL premix        2 g 100 mL/hr over 30 Minutes Intravenous On call to O.R. 04/29/12 1827 04/30/12 0713          Scheduled Meds:    . amiodarone  200 mg Oral q morning - 10a  . atorvastatin  20 mg Oral Daily  . docusate sodium  100-200 mg Oral BID  . furosemide  40 mg Oral q morning - 10a  . isosorbide mononitrate  60 mg Oral QHS  . pantoprazole  40 mg Oral Daily  . potassium chloride  10 mEq Oral BID  . [DISCONTINUED] furosemide  40 mg Intravenous Daily   Continuous  Infusions:    . [DISCONTINUED] phenylephrine (NEO-SYNEPHRINE) Adult infusion Stopped (04/29/12 2130)   PRN Meds:.ALPRAZolam, HYDROcodone-acetaminophen, HYDROmorphone (DILAUDID) injection, loratadine, nitroGLYCERIN, ondansetron   DVT Prophylaxis  Per primary team  Lab Results  Component Value Date   PLT 172 04/30/2012      Susa Raring K M.D on 05/01/2012 at 12:56 PM  Between 7am to 7pm - Pager - [phone]  After 7pm go to [email or web address] - password [email or web address]  And look for the night coverage person covering for me after hours  [facility] Office  [phone]    Subjective:   Dijion Amerman today has, No headache, No chest pain, No abdominal pain - No Nausea, No new weakness tingling or numbness, No Cough - SOB.    Objective:   Filed Vitals:   04/30/12 1256 04/30/12 1916 04/30/12 2100 05/01/12 0525  BP: 126/72 107/56 135/87 102/66  Pulse: 94 101 105  97  Temp: 98.4 F (36.9 C) 98.3 F (36.8 C) 98 F (36.7 C) 98.1 F (36.7 C)  TempSrc: Oral Oral Oral   Resp: 18 18 19 17   Height:      Weight:      SpO2: 96% 94% 93% 96%    Wt Readings from Last 3 Encounters:  04/29/12 68.6 kg (151 lb 3.8 oz)  04/29/12 68.6 kg (151 lb 3.8 oz)  03/31/12 68.6 kg (151 lb 3.8 oz)     Intake/Output Summary (Last 24 hours) at 05/01/12 1256 Last data filed at 05/01/12 0900  Gross per 24 hour  Intake    480 ml  Output    100 ml  Net    380 ml    Exam Awake Alert, Oriented X 3, No new F.N deficits, Normal affect [place].AT,PERRAL Supple Neck,No JVD, No cervical lymphadenopathy appriciated.  Symmetrical Chest wall movement, Good air movement bilaterally, CTAB RRR,No Gallops,Rubs or new Murmurs, No Parasternal Heave +ve B.Sounds, Abd Soft, Non tender, No organomegaly appriciated, No rebound - guarding or rigidity. No Cyanosis, Clubbing or edema, No new Rash or bruise     Data Review   Micro Results Recent Results (from the past 240 hour(s))  URINE CULTURE      Status: Normal   Collection Time   04/29/12 10:26 AM      Component Value Range Status Comment   Specimen Description URINE, CLEAN CATCH   Final    Special Requests NONE   Final    Culture  Setup Time 04/29/2012 22:06   Final    Colony Count NO GROWTH   Final    Culture NO GROWTH   Final    Report Status 04/30/2012 FINAL   Final     Radiology Reports Ct Abdomen Pelvis W Contrast  04/29/2012  *RADIOLOGY REPORT*  Clinical Data: Abdominal pain, epigastric pain  CT ABDOMEN AND PELVIS WITH CONTRAST  Technique:  Multidetector CT imaging of the abdomen and pelvis was performed following the standard protocol during bolus administration of intravenous contrast.  Contrast: 80mL OMNIPAQUE IOHEXOL 300 MG/ML  SOLN  Comparison: 03/03/2012 06/10/2010  Findings: There is pleural thickening and calcified pleural plaques right lower lobe anteriorly. These are stable from 06/10/2010. Nodular scarring and pleural calcification left base anteriorly. Stable.  Sagittal images of the spine shows osteopenia and degenerative changes lumbar spine.  Stable Schmorl's node deformity upper endplate of the L4 vertebral body.  Stable mild compression deformity superior endplate of L1 vertebral body.  Atherosclerotic calcifications of coronary arteries again noted.  The patient is status post CABG.  Enhanced liver shows no biliary ductal dilatation.  A cyst in inferior aspect of the left lobe measures 1.3 cm stable.  A cyst in right hepatic lobe measures 6 mm stable.  There is a hyperdense lesion in the right hepatic dome anteriorly measures 9 mm.  This is nonspecific.  May represent atypical hemangioma. Follow-up MRI is recommended for further evaluation. Nodular atelectasis or scarring left base anteriorly is stable.  The pancreas is somewhat atrophic.  Spleen is unremarkable.  Stable bilateral thickening of the adrenal glands.  Kidneys show symmetrical enhancement.  Multiple bilateral cysts again noted. The largest cyst in lower  pole of the right kidney measures 2.3 cm. The largest cyst in lower pole of the left kidney measures 9 mm.  There is nonobstructive calcified calculus in lower pole of the left kidney measures 4 mm.  No hydronephrosis or hydroureter. Extensive atherosclerotic calcifications abdominal aorta, splenic artery,  bilateral renal artery origin and the iliac arteries are noted.  No aortic aneurysm.  Mild distended stomach with contrast material without evidence of gastric outlet obstruction.  There are distended proximal small bowel loops with contrast material and air fluid levels.  In axial image 87 there is a left inguinal hernia containing small bowel loop.  The exiting small bowel from the hernia is smaller caliber.  Findings are consistent with a small bowel obstruction due to incarcerated left inguinal hernia.  Distal small bowel is smaller caliber.  Prostate gland calcifications are noted.  The urinary bladder is unremarkable.  No pericecal inflammation is noted.  The terminal ileum is small caliber.  No destructive bony lesions are noted within pelvis.   IMPRESSION:  1.  Distended proximal small bowel loops are noted with multiple air-fluid levels.  In axial image 84 a small bowel loop is seen entering a small left inguinal hernia.  The exiting small bowel is smaller caliber. Findings are consistent with small bowel obstruction due to incarcerated left inguinal hernia. 2.  Mild distended stomach with fluid and oral contrast material without evidence of gastric outlet obstruction. 3.  Bilateral multiple renal cysts.  No hydronephrosis or hydroureter. 4.  Stable left renal cyst.  There is a hyper dense lesion in the right hepatic dome anteriorly measures 9 mm.  Further evaluation with non emergent MRI is recommended. 5.  Stable scarring and pleural calcifications right lower lobe anteriorly.  Stable pleural calcifications and scarring in the left lower lobe anteriorly.  Critical findings discussed with Dr. Freida Busman.    Original Report Authenticated By: Natasha Mead, M.D.    Dg Chest Port 1 View  04/30/2012  *RADIOLOGY REPORT*  Clinical Data: CHF.  PORTABLE CHEST - 1 VIEW  Comparison: 04/29/2012  Findings: The patient has had median sternotomy CABG.  Heart is enlarged.  There is developing infiltrate in the right lung.  Scar or atelectasis again identified at the left lung base.  There is prominence of interstitial markings, likely chronic.  Elevation of the right hemidiaphragm appears chronic and stable.  IMPRESSION: Developing infiltrate in the right lower lobe.   Original Report Authenticated By: Norva Pavlov, M.D.    Dg Chest Port 1 View  04/29/2012  *RADIOLOGY REPORT*  Clinical Data: fluid overload  PORTABLE CHEST - 1 VIEW  Comparison: 04/11/2012  Findings: Cardiomediastinal silhouette is stable.  Status post CABG.  No acute infiltrate or pulmonary edema is stable bilateral basilar scarring.  Stable scarring in the lingula.  No pleural effusion.  Again noted mild elevation of the right hemidiaphragm.  IMPRESSION: No active disease.  Stable bilateral basilar scarring.   Original Report Authenticated By: Natasha Mead, M.D.     CBC  Lab 04/30/12 0435 04/29/12 0733  WBC 9.3 11.0*  HGB 12.8* 13.4  HCT 39.0 40.1  PLT 172 216  MCV 93.5 92.2  MCH 30.7 30.8  MCHC 32.8 33.4  RDW 15.2 15.1  LYMPHSABS -- 1.3  MONOABS -- 1.2*  EOSABS -- 0.1  BASOSABS -- 0.0  BANDABS -- --    Chemistries   Lab 04/30/12 0435 04/29/12 0733  NA 137 139  K 4.3 4.8  CL 100 98  CO2 27 31  GLUCOSE 92 128*  BUN 20 28*  CREATININE 1.03 1.24  CALCIUM 10.0 10.8*  MG -- --  AST 16 19  ALT 8 10  ALKPHOS 62 70  BILITOT 0.5 0.5   ------------------------------------------------------------------------------------------------------------------ estimated creatinine clearance is 38.9 ml/min (by C-G formula based  on Cr of  1.03). ------------------------------------------------------------------------------------------------------------------ No results found for this basename: HGBA1C:2 in the last 72 hours ------------------------------------------------------------------------------------------------------------------ No results found for this basename: CHOL:2,HDL:2,LDLCALC:2,TRIG:2,CHOLHDL:2,LDLDIRECT:2 in the last 72 hours ------------------------------------------------------------------------------------------------------------------ No results found for this basename: TSH,T4TOTAL,FREET3,T3FREE,THYROIDAB in the last 72 hours ------------------------------------------------------------------------------------------------------------------ No results found for this basename: VITAMINB12:2,FOLATE:2,FERRITIN:2,TIBC:2,IRON:2,RETICCTPCT:2 in the last 72 hours  Coagulation profile No results found for this basename: INR:5,PROTIME:5 in the last 168 hours  No results found for this basename: DDIMER:2 in the last 72 hours  Cardiac Enzymes No results found for this basename: CK:3,CKMB:3,TROPONINI:3,MYOGLOBIN:3 in the last 168 hours ------------------------------------------------------------------------------------------------------------------ No components found with this basename: POCBNP:3

## 2012-05-01 NOTE — Progress Notes (Addendum)
SUBJECTIVE:  Denies any chest pain or SOB  OBJECTIVE:   Vitals:   Filed Vitals:   04/30/12 1256 04/30/12 1916 04/30/12 2100 05/01/12 0525  BP: 126/72 107/56 135/87 102/66  Pulse: 94 101 105 97  Temp: 98.4 F (36.9 C) 98.3 F (36.8 C) 98 F (36.7 C) 98.1 F (36.7 C)  TempSrc: Oral Oral Oral   Resp: 18 18 19 17   Height:      Weight:      SpO2: 96% 94% 93% 96%   I&O's:   Intake/Output Summary (Last 24 hours) at 05/01/12 1042 Last data filed at 05/01/12 0900  Gross per 24 hour  Intake    480 ml  Output    100 ml  Net    380 ml   TELEMETRY: Reviewed telemetry pt in NSR:     PHYSICAL EXAM General: Well developed, well nourished, in no acute distress Head: Eyes PERRLA, No xanthomas.   Normal cephalic and atramatic  Lungs:   Clear bilaterally to auscultation and percussion. Heart:   HRRR S1 S2 Pulses are 2+ & equal. Abdomen: Bowel sounds are positive, abdomen soft and non-tender without masses  Extremities:   No clubbing, cyanosis or edema.  DP +1 Neuro: Alert and oriented X 3. Psych:  Good affect, responds appropriately   LABS: Basic Metabolic Panel:  Basename 04/30/12 0435 04/29/12 0733  NA 137 139  K 4.3 4.8  CL 100 98  CO2 27 31  GLUCOSE 92 128*  BUN 20 28*  CREATININE 1.03 1.24  CALCIUM 10.0 10.8*  MG -- --  PHOS -- --   Liver Function Tests:  [facility] 04/30/12 0435 04/29/12 0733  AST 16 19  ALT 8 10  ALKPHOS 62 70  BILITOT 0.5 0.5  PROT 6.2 7.0  ALBUMIN 3.4* 3.8    Basename 04/29/12 0733  LIPASE 46  AMYLASE --   CBC:  Basename 04/30/12 0435 04/29/12 0733  WBC 9.3 11.0*  NEUTROABS -- 8.5*  HGB 12.8* 13.4  HCT 39.0 40.1  MCV 93.5 92.2  PLT 172 216   Coag Panel:   Lab Results  Component Value Date   INR 0.98 11/05/2011   INR 1.12 06/12/2010    RADIOLOGY: Ct Abdomen Pelvis W Contrast  04/29/2012  *RADIOLOGY REPORT*  Clinical Data: Abdominal pain, epigastric pain  CT ABDOMEN AND PELVIS WITH CONTRAST  Technique:  Multidetector CT  imaging of the abdomen and pelvis was performed following the standard protocol during bolus administration of intravenous contrast.  Contrast: 80mL OMNIPAQUE IOHEXOL 300 MG/ML  SOLN  Comparison: 03/03/2012 06/10/2010  Findings: There is pleural thickening and calcified pleural plaques right lower lobe anteriorly. These are stable from 06/10/2010. Nodular scarring and pleural calcification left base anteriorly. Stable.  Sagittal images of the spine shows osteopenia and degenerative changes lumbar spine.  Stable Schmorl's node deformity upper endplate of the L4 vertebral body.  Stable mild compression deformity superior endplate of L1 vertebral body.  Atherosclerotic calcifications of coronary arteries again noted.  The patient is status post CABG.  Enhanced liver shows no biliary ductal dilatation.  A cyst in inferior aspect of the left lobe measures 1.3 cm stable.  A cyst in right hepatic lobe measures 6 mm stable.  There is a hyperdense lesion in the right hepatic dome anteriorly measures 9 mm.  This is nonspecific.  May represent atypical hemangioma. Follow-up MRI is recommended for further evaluation. Nodular atelectasis or scarring left base anteriorly is stable.  The pancreas is somewhat atrophic.  Spleen is unremarkable.  Stable bilateral thickening of the adrenal glands.  Kidneys show symmetrical enhancement.  Multiple bilateral cysts again noted. The largest cyst in lower pole of the right kidney measures 2.3 cm. The largest cyst in lower pole of the left kidney measures 9 mm.  There is nonobstructive calcified calculus in lower pole of the left kidney measures 4 mm.  No hydronephrosis or hydroureter. Extensive atherosclerotic calcifications abdominal aorta, splenic artery, bilateral renal artery origin and the iliac arteries are noted.  No aortic aneurysm.  Mild distended stomach with contrast material without evidence of gastric outlet obstruction.  There are distended proximal small bowel loops with  contrast material and air fluid levels.  In axial image 87 there is a left inguinal hernia containing small bowel loop.  The exiting small bowel from the hernia is smaller caliber.  Findings are consistent with a small bowel obstruction due to incarcerated left inguinal hernia.  Distal small bowel is smaller caliber.  Prostate gland calcifications are noted.  The urinary bladder is unremarkable.  No pericecal inflammation is noted.  The terminal ileum is small caliber.  No destructive bony lesions are noted within pelvis.   IMPRESSION:  1.  Distended proximal small bowel loops are noted with multiple air-fluid levels.  In axial image 84 a small bowel loop is seen entering a small left inguinal hernia.  The exiting small bowel is smaller caliber. Findings are consistent with small bowel obstruction due to incarcerated left inguinal hernia. 2.  Mild distended stomach with fluid and oral contrast material without evidence of gastric outlet obstruction. 3.  Bilateral multiple renal cysts.  No hydronephrosis or hydroureter. 4.  Stable left renal cyst.  There is a hyper dense lesion in the right hepatic dome anteriorly measures 9 mm.  Further evaluation with non emergent MRI is recommended. 5.  Stable scarring and pleural calcifications right lower lobe anteriorly.  Stable pleural calcifications and scarring in the left lower lobe anteriorly.  Critical findings discussed with Dr. Freida Busman.   Original Report Authenticated By: Natasha Mead, M.D.    Dg Chest Port 1 View  04/30/2012  *RADIOLOGY REPORT*  Clinical Data: CHF.  PORTABLE CHEST - 1 VIEW  Comparison: 04/29/2012  Findings: The patient has had median sternotomy CABG.  Heart is enlarged.  There is developing infiltrate in the right lung.  Scar or atelectasis again identified at the left lung base.  There is prominence of interstitial markings, likely chronic.  Elevation of the right hemidiaphragm appears chronic and stable.  IMPRESSION: Developing infiltrate in the right  lower lobe.   Original Report Authenticated By: Norva Pavlov, M.D.    Dg Chest Port 1 View  04/29/2012  *RADIOLOGY REPORT*  Clinical Data: fluid overload  PORTABLE CHEST - 1 VIEW  Comparison: 04/11/2012  Findings: Cardiomediastinal silhouette is stable.  Status post CABG.  No acute infiltrate or pulmonary edema is stable bilateral basilar scarring.  Stable scarring in the lingula.  No pleural effusion.  Again noted mild elevation of the right hemidiaphragm.  IMPRESSION: No active disease.  Stable bilateral basilar scarring.   Original Report Authenticated By: Natasha Mead, M.D.       ASSESSMENT:  1.  Incarcerated inguinal hernia, unilateral - s/p surgical repair 2.  Small Bowel obstruction - resolved 3.  CAD - stable with IMI 10/2011 - med management due to advanced age and DNR status 4.  Moderate AS with EF 40-45% 5.  Hypotension - resolved  PLAN:   1.  No  new cardiac recs  Quintella Reichert, MD  05/01/2012  10:42 AM

## 2012-05-01 NOTE — Progress Notes (Addendum)
Agree with above, can discharge today. Will notify neurosurgery as he has epidural injection scheduled for Thursday.

## 2012-05-01 NOTE — Progress Notes (Signed)
  [facility] Progress Note   2 Days Post-Op   Subjective:ssAssesment and plan  Feeling better. Tolerating food and no n/v. Denies pain, only mild discomfort on incision area. Denies BM or flatus.   Objective:Assesment and plan   Vital signs in last 24 hours: Temp:  [98 F (36.7 C)-98.4 F (36.9 C)] 98.1 F (36.7 C) (11/11 0525) Pulse Rate:  [93-105] 97  (11/11 0525) Resp:  [17-28] 17  (11/11 0525) BP: (102-135)/(56-87) 102/66 mmHg (11/11 0525) SpO2:  [93 %-98 %] 96 % (11/11 0525) Last BM Date: 04/30/12  Intake/Output from previous day: 11/10 0701 - 11/11 0700 In: 724 [P.O.:720; IV Piggyback:4] Out: 425 [Urine:425] Intake/Output this shift:   PE: Gen: NAD Resp: Normal WOB, clear breath sounds bilaterally no rales. Abd: soft, no tender to palpation. Left inguinal incision with no erythema or drainage. Normal BS.  Lab Results:   [facility] 04/30/12 0435 04/29/12 0733  WBC 9.3 11.0*  HGB 12.8* 13.4  HCT 39.0 40.1  PLT 172 216   BMET  Basename 04/30/12 0435 04/29/12 0733  NA 137 139  K 4.3 4.8  CL 100 98  CO2 27 31  GLUCOSE 92 128*  BUN 20 28*  CREATININE 1.03 1.24  CALCIUM 10.0 10.8*   Lipase     Component Value Date/Time   LIPASE 46 04/29/2012 0733   Studies/Results: Ct Abdomen Pelvis W Contrast  04/29/2012  MPRESSION:  1.  Distended proximal small bowel loops are noted with multiple air-fluid levels.  In axial image 84 a small bowel loop is seen entering a small left inguinal hernia.  The exiting small bowel is smaller caliber. Findings are consistent with small bowel obstruction due to incarcerated left inguinal hernia. 2.  Mild distended stomach with fluid and oral contrast material without evidence of gastric outlet obstruction. 3.  Bilateral multiple renal cysts.  No hydronephrosis or hydroureter. 4.  Stable left renal cyst.  There is a hyper dense lesion in the right hepatic dome anteriorly measures 9 mm.  Further evaluation with non emergent  MRI is recommended. 5.  Stable scarring and pleural calcifications right lower lobe anteriorly.  Stable pleural calcifications and scarring in the left lower lobe anteriorly.  Critical findings discussed with Dr. Freida Busman.   Original Report Authenticated By: Natasha Mead, M.D.    Dg Chest Port 1 View  04/30/2012  IMPRESSION: Developing infiltrate in the right lower lobe.   Original Report Authenticated By: Norva Pavlov, M.D.    Dg Chest Port 1 View  04/29/2012  IMPRESSION: No active disease.  Stable bilateral basilar scarring.   Original Report Authenticated By: Natasha Mead, M.D.    Anti-infectives: Anti-infectives     Start     Dose/Rate Route Frequency Ordered Stop   04/30/12 0600   ceFAZolin (ANCEF) IVPB 2 g/50 mL premix        2 g 100 mL/hr over 30 Minutes Intravenous On call to O.R. 04/29/12 1827 04/30/12 0713         Assessment and Plan:essAssesment and plan  Incarcerated hernia. S/P day 2 Post Op of Left Hernia repair with mesh.  -Will need to be passing flatus in order to D/C -CXR yesterday showed developing right lower lobe infiltrate. No symptoms, no fever, negative physical exam, normal WBC. Most likely atelectasis. Will continue to f/u.  -Continue to hold ASA and Aspirin in anticipation of back procedure  -DVT prophylaxis: SCDs    LOS: 2 days   PILOTO, Anikka Marsan 05/01/2012, 9:03 AM

## 2012-05-16 ENCOUNTER — Encounter (INDEPENDENT_AMBULATORY_CARE_PROVIDER_SITE_OTHER): Payer: Self-pay | Admitting: General Surgery

## 2012-05-16 ENCOUNTER — Ambulatory Visit (INDEPENDENT_AMBULATORY_CARE_PROVIDER_SITE_OTHER): Payer: MEDICARE | Admitting: General Surgery

## 2012-05-16 VITALS — BP 128/78 | HR 88 | Temp 98.7°F | Resp 18 | Ht 66.0 in | Wt 147.0 lb

## 2012-05-16 DIAGNOSIS — Z9889 Other specified postprocedural states: Secondary | ICD-10-CM

## 2012-05-16 DIAGNOSIS — Z8719 Personal history of other diseases of the digestive system: Secondary | ICD-10-CM

## 2012-05-16 NOTE — Progress Notes (Signed)
Patient ID: Shaun [name], male   DOB: 1915/02/10, 78 y.o.   MRN: [id number] The patient is a 78 year old male status post left femoral hernia repair with mesh that was done open. Patient has been doing well postoperatively. He has minimal pain and has been taking no pain medications the operation.  On exam Wound is clean dry and intact there is no hernia.   Assessment and plan:  Patient follow up when necessary The patient and family have any concerns patient in callback to be seen.

## 2012-11-17 ENCOUNTER — Other Ambulatory Visit: Payer: Self-pay | Admitting: Neurosurgery

## 2012-11-17 DIAGNOSIS — M48061 Spinal stenosis, lumbar region without neurogenic claudication: Secondary | ICD-10-CM

## 2012-11-21 ENCOUNTER — Ambulatory Visit
Admission: RE | Admit: 2012-11-21 | Discharge: 2012-11-21 | Disposition: A | Discharge status: Home / Self Care | Payer: MEDICARE | Source: Ambulatory Visit | Attending: Neurosurgery | Admitting: Neurosurgery

## 2012-11-21 DIAGNOSIS — M48061 Spinal stenosis, lumbar region without neurogenic claudication: Secondary | ICD-10-CM

## 2013-01-17 ENCOUNTER — Other Ambulatory Visit: Payer: Self-pay | Admitting: Dermatology

## 2013-04-09 ENCOUNTER — Encounter: Payer: Self-pay | Admitting: Interventional Cardiology

## 2013-04-09 ENCOUNTER — Ambulatory Visit (INDEPENDENT_AMBULATORY_CARE_PROVIDER_SITE_OTHER): Payer: MEDICARE | Admitting: Interventional Cardiology

## 2013-04-09 VITALS — BP 90/78 | HR 100 | Ht 66.0 in | Wt 144.0 lb

## 2013-04-09 DIAGNOSIS — I359 Nonrheumatic aortic valve disorder, unspecified: Secondary | ICD-10-CM

## 2013-04-09 DIAGNOSIS — I350 Nonrheumatic aortic (valve) stenosis: Secondary | ICD-10-CM

## 2013-04-09 DIAGNOSIS — R0602 Shortness of breath: Secondary | ICD-10-CM

## 2013-04-09 DIAGNOSIS — I10 Essential (primary) hypertension: Secondary | ICD-10-CM

## 2013-04-09 DIAGNOSIS — E785 Hyperlipidemia, unspecified: Secondary | ICD-10-CM

## 2013-04-09 MED ORDER — ATORVASTATIN CALCIUM 20 MG PO TABS: 20.0000 mg | ORAL_TABLET | Freq: Every day | ORAL | Status: DC

## 2013-04-09 MED ORDER — FUROSEMIDE 40 MG PO TABS: 40.0000 mg | ORAL_TABLET | Freq: Every morning | ORAL | Status: DC

## 2013-04-09 MED ORDER — NITROGLYCERIN 0.4 MG SL SUBL: 0.4000 mg | SUBLINGUAL_TABLET | SUBLINGUAL | Status: AC | PRN

## 2013-04-09 NOTE — Patient Instructions (Addendum)
Your physician wants you to follow-up in: 6 months with Dr. Eldridge Dace. You will receive a reminder letter in the mail two months in advance. If you don't receive a letter, please call our office to schedule the follow-up appointment.  Your physician recommends that you continue on your current medications as directed. Please refer to the Current Medication list given to you today.  Refills have been sent in.

## 2013-04-09 NOTE — Progress Notes (Signed)
Patient ID: Shaun [name], male   DOB: 24-Mar-1915, 79 y.o.   MRN: [id number]    [address] [place], [place]  [address] Phone: [phone] Fax:  [phone]  Date:  04/09/2013   ID:  Shaun [name], DOB 1915-04-11, MRN [id number]  PCP:  Lupe Carney, MD      History of Present Illness: Shaun [name] is a 79 y.o. male  with coronary artery disease who had an inferior MI on May 17,2013. He had a long recovery from this and was medically treated. He is doing well now. He tries to walk. He is limited somewhat by hip pain. He was found to have a kidney stone and had cystoscopy. He did well with this. His left ventricular function was mildly reduced at the time of his MI. He does not report any orthopnea, PND, or chest pain.    Wt Readings from Last 3 Encounters:  04/09/13 144 lb (65.318 kg)  05/16/12 147 lb (66.679 kg)  04/29/12 151 lb 3.8 oz (68.6 kg)     Past Medical History  Diagnosis Date  . Hypertension   . CAD (coronary artery disease)   . Osteoarthritis   . DDD (degenerative disc disease)   . Spinal stenosis   . Heart murmur   . Shortness of breath   . Myocardial infarct     11/05/11 , 1999  . Anxiety   . Pneumonia     hx of double pneumonia   . GERD (gastroesophageal reflux disease)   . Chronic kidney disease     hxof multiple kidney stones   . Cancer     hx of skin cancery     Current Outpatient Prescriptions  Medication Sig Dispense Refill  . amiodarone (PACERONE) 200 MG tablet Take 200 mg by mouth every morning.      Marland Kitchen aspirin EC 81 MG tablet Take 81 mg by mouth daily.      Marland Kitchen atorvastatin (LIPITOR) 20 MG tablet Take 20 mg by mouth daily.      . beta carotene w/minerals (OCUVITE) tablet Take 1 tablet by mouth daily.       . clopidogrel (PLAVIX) 75 MG tablet       . docusate sodium (COLACE) 100 MG capsule Take 100-200 mg by mouth 2 (two) times daily. Take one tablet in the morning and two tablets in the evening.      . furosemide  (LASIX) 40 MG tablet Take 40 mg by mouth every morning.      . isosorbide mononitrate (IMDUR) 60 MG 24 hr tablet Take 60 mg by mouth at bedtime.       Marland Kitchen loratadine (CLARITIN) 10 MG tablet Take 10 mg by mouth daily as needed. For allergies      . Misc Natural Products (OSTEO BI-FLEX ADV JOINT SHIELD) TABS Take 1 tablet by mouth 2 (two) times daily.      . Multiple Vitamins-Minerals (MULTIVITAMINS THER. W/MINERALS) TABS Take 1 tablet by mouth daily.       . nitroGLYCERIN (NITROSTAT) 0.4 MG SL tablet Place 0.4 mg under the tongue every 5 (five) minutes as needed. For chest pain.      Marland Kitchen omeprazole (PRILOSEC) 20 MG capsule Take 1 capsule (20 mg total) by mouth daily.      Marland Kitchen OVER THE COUNTER MEDICATION Herb-lax two in the am and 2 in pm      . potassium citrate (UROCIT-K) 10 MEQ (1080 MG) SR tablet       .  SYNTHROID 75 MCG tablet        No current facility-administered medications for this visit.    Allergies:    Allergies  Allergen Reactions  . Cocaine     Social History:  The patient  reports that he has never smoked. He has never used smokeless tobacco. He reports that he does not drink alcohol or use illicit drugs.   Family History:  The patient's family history includes Cancer in his father; Coronary artery disease in his father; Stroke in his mother.   ROS:  Please see the history of present illness.  No nausea, vomiting.  No fevers, chills.  No focal weakness.  No dysuria.    All other systems reviewed and negative.   PHYSICAL EXAM: VS:  BP 90/78  Pulse 100  Ht 5\' 6"  (1.676 m)  Wt 144 lb (65.318 kg)  BMI 23.25 kg/m2 Well nourished, well developed, in no acute distress HEENT: normal Neck: no JVD, no carotid bruits Cardiac:  normal S1, S2; RRR; 3/6 systolic murmur Lungs:  clear to auscultation bilaterally, no wheezing, rhonchi or rales Abd: soft, nontender, no hepatomegaly Ext: mild right leg edema Skin: warm and dry Neuro:   no focal abnormalities noted      ASSESSMENT AND  PLAN:  Aortic valve disorders  Notes: Aortic stenosis by exam.  This was moderate by echocardiogram done in May of 2013. I do not think this would limit him in terms of urologic procedure. No syncope. I think his dyspnea is multifactorial and would not resolve just with treatment of his valve.   2. Coronary atherosclerosis of native coronary artery and risk factors. LAB: Statin Panel Notes: No angina. Recent MI. no further ischemic workup needed prior to surgery. He is not a candidate for heart catheterization. LDL controlled in 2012. WIll plan for statin panel in the near future. Lipids from April reviewed. Well controlled. Continue atorvastatin 40 mg daily.   HTN well controlled.    3. Dyspnea   Notes: Continue Lasix 40 mg every daily. This is a recent change. Likely related to fluid and deconditioning. He is geting more active as the weather improves.  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   4. Others  Notes: OK to stop plavix 5 days prior to skin surgery or epidural.    Procedures     Signed, Fredric Mare, MD, [facility] 04/09/2013 11:54 AM

## 2013-04-17 ENCOUNTER — Telehealth: Payer: Self-pay | Admitting: Interventional Cardiology

## 2013-04-17 MED ORDER — ATORVASTATIN CALCIUM 40 MG PO TABS: 40.0000 mg | ORAL_TABLET | Freq: Every day | ORAL | Status: DC

## 2013-04-17 NOTE — Telephone Encounter (Signed)
Pts wife states pt has been on atrovastatin 40mg  daily for quite sometime. Med list states 20mg . Updated med list and sent in new rx. FYI to Dr. Eldridge Dace.

## 2013-04-26 ENCOUNTER — Other Ambulatory Visit: Payer: Self-pay

## 2013-04-29 ENCOUNTER — Other Ambulatory Visit: Payer: Self-pay | Admitting: Interventional Cardiology

## 2013-05-07 ENCOUNTER — Telehealth: Payer: Self-pay | Admitting: Interventional Cardiology

## 2013-05-07 DIAGNOSIS — Z79899 Other long term (current) drug therapy: Secondary | ICD-10-CM

## 2013-05-07 NOTE — Telephone Encounter (Signed)
Per Dr. Eldridge Dace ok for pt to increase lasix to 80mg  qd and check bmet in 1-2 weeks.

## 2013-05-07 NOTE — Telephone Encounter (Signed)
Pts wife states pt usually takes lasix 40mg  daily, but pt has had increased swelling and a 2-3 lb weight gain after 1-2 days of the 40mg . When he gets 80mg  of lasix for about 2-3 days he does much better, which pts wife does intermittently after 2-3 days of lasix 40mg . She would like to know if pt can stay on lasix 80mg  daily. Pt is also very restless.

## 2013-05-07 NOTE — Telephone Encounter (Signed)
New Problem:  Pt's wife states she would like to talk to the nurse regarding the pt's Lasix. Pt's wife states she has some questions and concerns. Please advise

## 2013-05-08 MED ORDER — FUROSEMIDE 40 MG PO TABS: ORAL_TABLET | ORAL | Status: DC

## 2013-05-08 NOTE — Telephone Encounter (Signed)
Pts wife notified. Rx for Lasix 40mg  2 [facility] Bmet scheduled for 05/21/13.

## 2013-05-21 ENCOUNTER — Other Ambulatory Visit (INDEPENDENT_AMBULATORY_CARE_PROVIDER_SITE_OTHER): Payer: MEDICARE

## 2013-05-21 DIAGNOSIS — Z79899 Other long term (current) drug therapy: Secondary | ICD-10-CM

## 2013-05-21 LAB — BASIC METABOLIC PANEL
BUN: 35 mg/dL — ABNORMAL HIGH (ref 6–23)
CO2: 31 mEq/L (ref 19–32)
Calcium: 10.1 mg/dL (ref 8.4–10.5)
Chloride: 102 mEq/L (ref 96–112)
Creatinine, Ser: 1.5 mg/dL (ref 0.4–1.5)
GFR: 45.05 mL/min — ABNORMAL LOW (ref >60.00)
Glucose, Bld: 112 mg/dL — ABNORMAL HIGH (ref 70–99)
Sodium: 142 mEq/L (ref 135–145)

## 2013-05-30 ENCOUNTER — Other Ambulatory Visit: Payer: Self-pay | Admitting: Interventional Cardiology

## 2013-07-29 ENCOUNTER — Other Ambulatory Visit: Payer: Self-pay | Admitting: Interventional Cardiology

## 2013-08-28 ENCOUNTER — Other Ambulatory Visit: Payer: Self-pay | Admitting: Interventional Cardiology

## 2013-10-18 ENCOUNTER — Ambulatory Visit: Payer: MEDICARE | Admitting: Interventional Cardiology

## 2013-10-24 ENCOUNTER — Other Ambulatory Visit: Payer: Self-pay | Admitting: Interventional Cardiology

## 2013-10-28 ENCOUNTER — Other Ambulatory Visit: Payer: Self-pay | Admitting: Interventional Cardiology

## 2013-11-02 ENCOUNTER — Ambulatory Visit: Payer: MEDICARE | Admitting: Interventional Cardiology

## 2013-11-03 ENCOUNTER — Other Ambulatory Visit: Payer: Self-pay | Admitting: Interventional Cardiology

## 2013-11-13 IMAGING — CR DG CHEST 2V
2 series · 2 of 2 positions shown · non-contrast
Comparison: Portable chest x-ray 06/10/2010 [HOSPITAL].
Two-view chest x-ray 12/17/2004 and [DATE] [DATE] [REDACTED]

CLINICAL DATA: Fever.  Cough.  Shortness of breath.  Generalized
weakness.  Former smoker.  Prior CABG.

CHEST - 2 VIEW 05/29/2011:

[w chest pa]
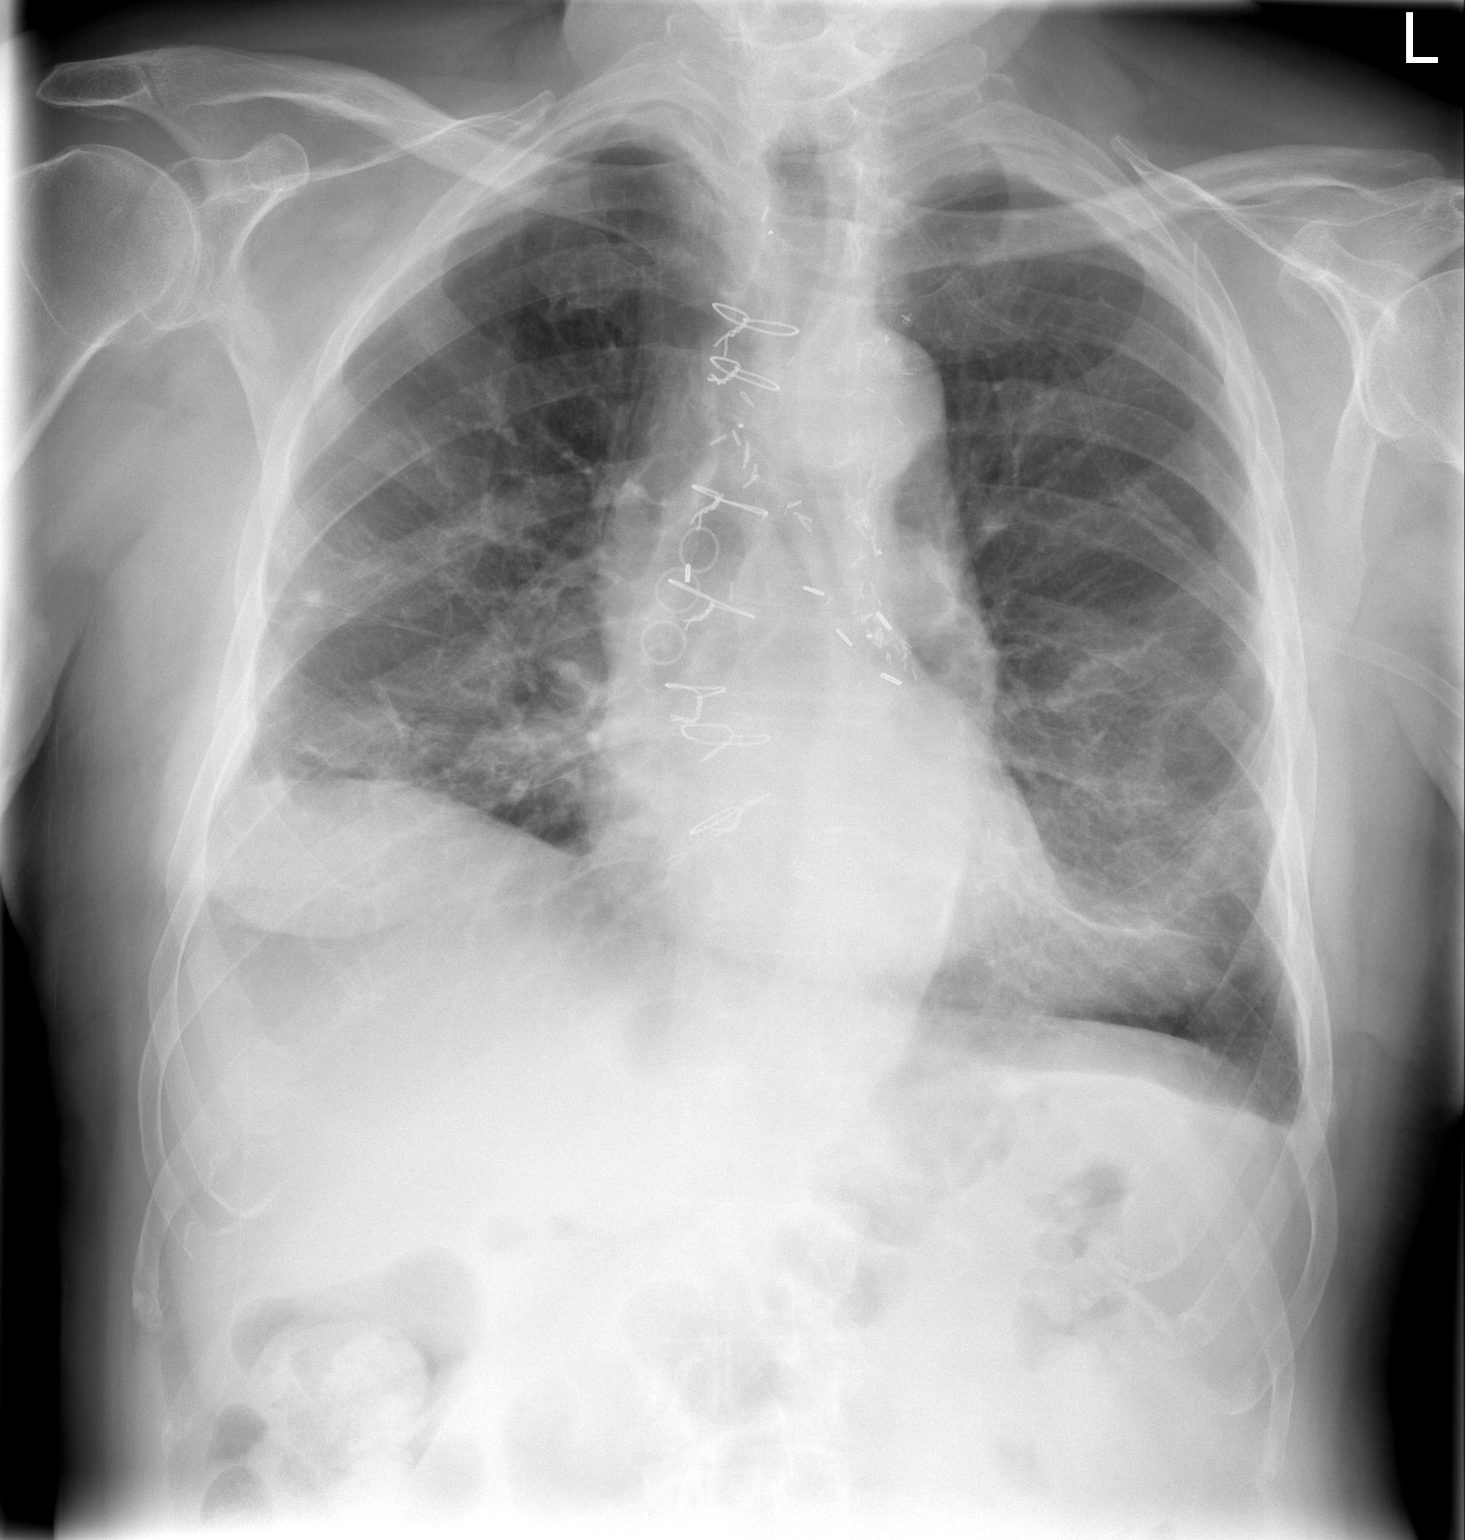

[w chest lat]
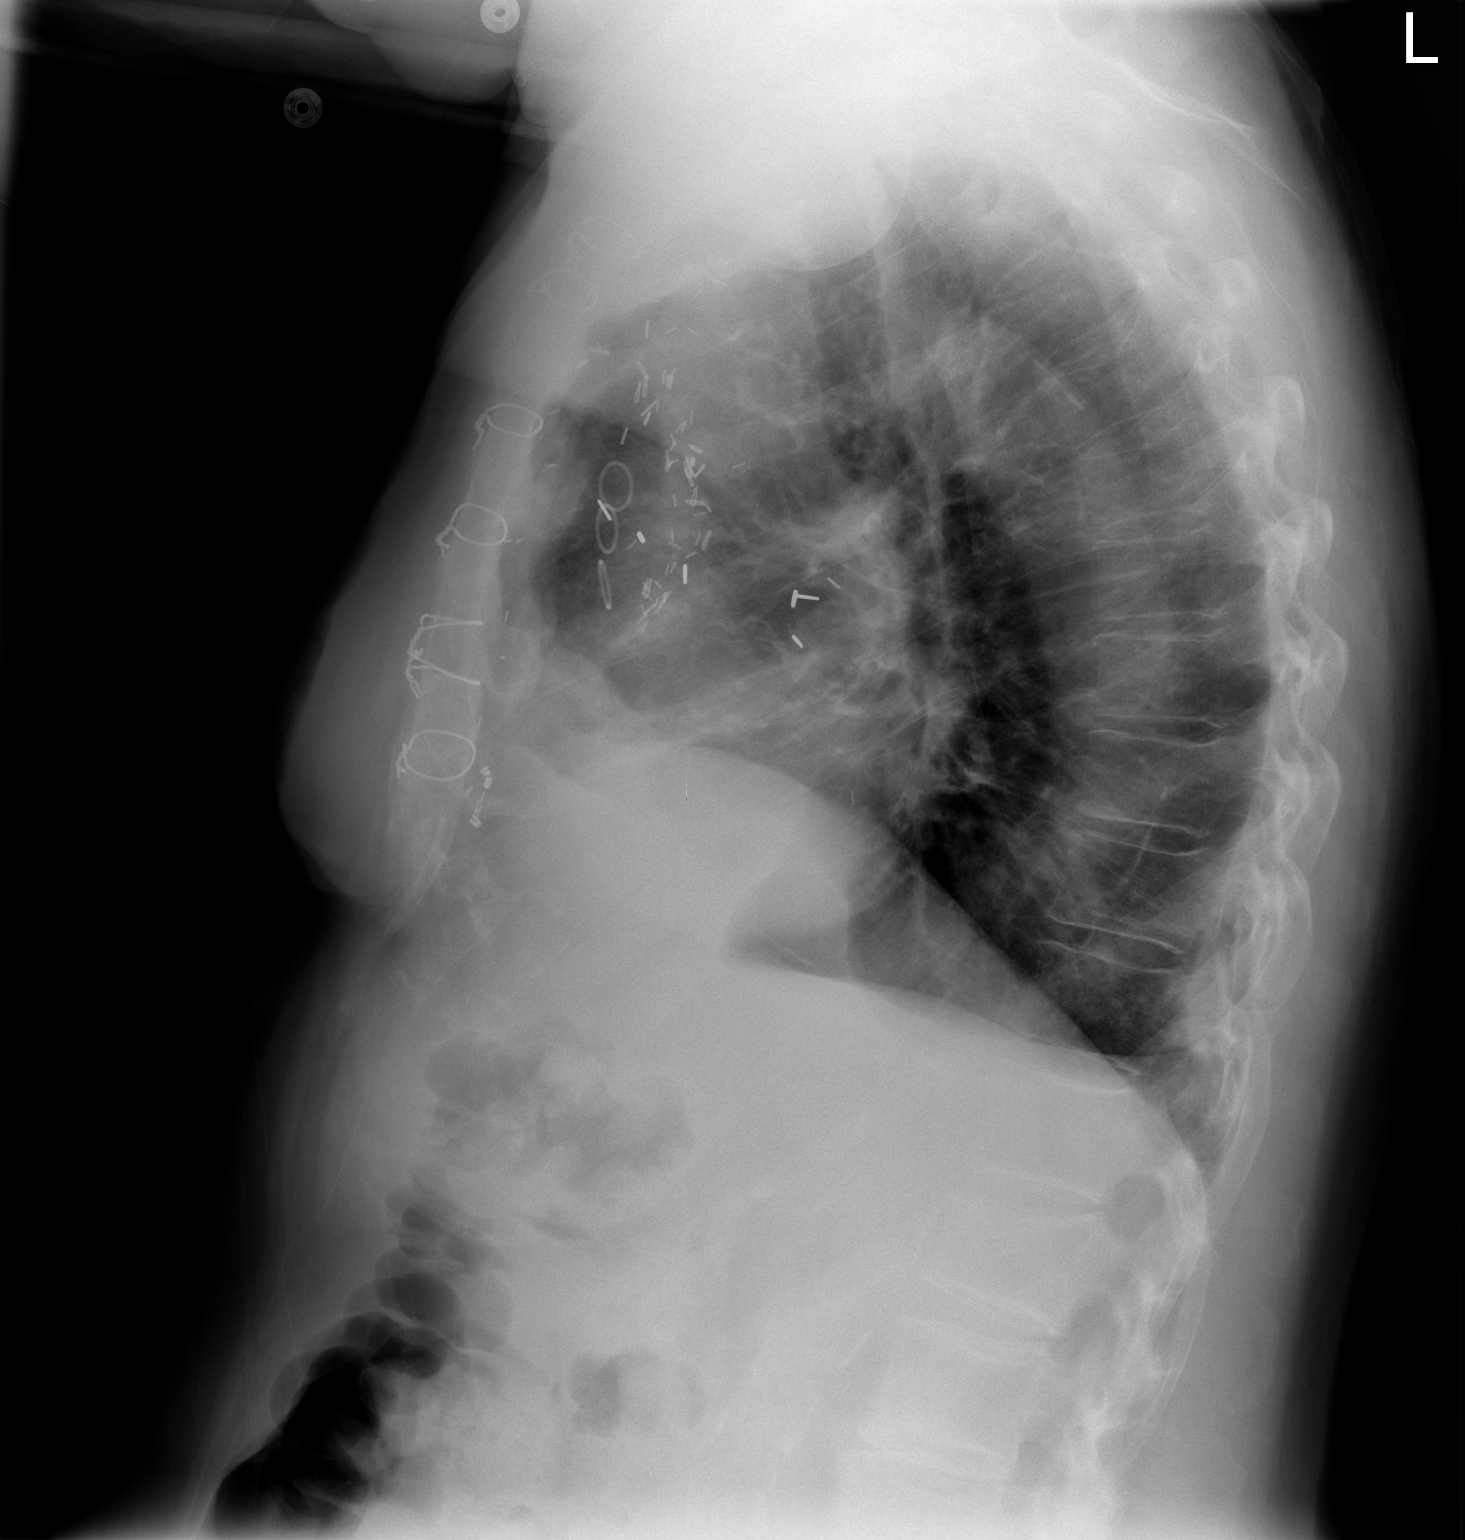

[2 of 2 positions shown; findings below may reference images not displayed]

FINDINGS: Prior sternotomy for CABG.  Cardiac silhouette normal in
size.  Thoracic aorta tortuous and atherosclerotic, unchanged.
Hilar and mediastinal contours otherwise unremarkable.
Pleuroparenchymal scarring at both bases with blunting of the
costophrenic angles, unchanged.  Calcified granuloma in the right
mid lung, unchanged.  No new pulmonary parenchymal abnormalities.
Pulmonary vascularity normal without evidence of pulmonary edema.
Visualized bony thorax intact.
IMPRESSION: No acute cardiopulmonary disease.  Stable pleuroparenchymal
scarring in the lung bases and calcified granuloma in the right mid
lung.

## 2013-11-19 ENCOUNTER — Other Ambulatory Visit: Payer: Self-pay | Admitting: Cardiology

## 2013-11-19 DIAGNOSIS — E785 Hyperlipidemia, unspecified: Secondary | ICD-10-CM

## 2013-11-21 ENCOUNTER — Encounter: Payer: Self-pay | Admitting: Interventional Cardiology

## 2013-11-21 ENCOUNTER — Other Ambulatory Visit (INDEPENDENT_AMBULATORY_CARE_PROVIDER_SITE_OTHER): Payer: MEDICARE

## 2013-11-21 ENCOUNTER — Ambulatory Visit (INDEPENDENT_AMBULATORY_CARE_PROVIDER_SITE_OTHER): Payer: MEDICARE | Admitting: Interventional Cardiology

## 2013-11-21 VITALS — BP 114/71 | HR 101 | Ht 66.0 in | Wt 145.8 lb

## 2013-11-21 DIAGNOSIS — E785 Hyperlipidemia, unspecified: Secondary | ICD-10-CM | POA: Diagnosis not present

## 2013-11-21 DIAGNOSIS — I359 Nonrheumatic aortic valve disorder, unspecified: Secondary | ICD-10-CM

## 2013-11-21 DIAGNOSIS — I2510 Atherosclerotic heart disease of native coronary artery without angina pectoris: Secondary | ICD-10-CM

## 2013-11-21 DIAGNOSIS — R0602 Shortness of breath: Secondary | ICD-10-CM

## 2013-11-21 DIAGNOSIS — I350 Nonrheumatic aortic (valve) stenosis: Secondary | ICD-10-CM

## 2013-11-21 MED ORDER — CLOPIDOGREL BISULFATE 75 MG PO TABS: ORAL_TABLET | ORAL | Status: DC

## 2013-11-21 NOTE — Patient Instructions (Addendum)
Your physician recommends that you continue on your current medications as directed. Please refer to the Current Medication list given to you today.  Your physician wants you to follow-up in: 1 year with Dr. Varanasi. You will receive a reminder letter in the mail two months in advance. If you don't receive a letter, please call our office to schedule the follow-up appointment.  

## 2013-11-21 NOTE — Progress Notes (Signed)
Patient ID: Shaun [name], male   DOB: 1915/06/19, 80 y.o.   MRN: [id number]  ECG : Sinus tachycardia, septal Q waves, inferior T wave inversions  [place]

## 2013-11-21 NOTE — Progress Notes (Signed)
Patient ID: Shaun [name], male   DOB: 07/15/1914, 80 y.o.   MRN: [id number] Patient ID: Shaun [name], male   DOB: 1914-08-27, 80 y.o.   MRN: [id number]    [address], [place], [place]  [address] Phone: [phone] Fax:  [phone]  Date:  11/21/2013   ID:  Shaun [name], DOB 1914-09-01, MRN [id number]  PCP:  Donnie Coffin, MD      History of Present Illness: Shaun [name] is a 80 y.o. male  with coronary artery disease who had an inferior MI on May 17,2013. He had a long recovery from this and was medically treated. He is doing well now. He tries to walk. He is limited somewhat by hip pain. He was found to have a kidney stone and had cystoscopy in 2013. He did well with this. His left ventricular function was mildly reduced at the time of his MI. He does not report any orthopnea, PND, or chest pain.  He did well on 80 mg Lasix daily.  He reduced to 60 mg and he did well.  As the weather got warmer, he had more swelling.  He had a hip shot for pain.      Wt Readings from Last 3 Encounters:  11/21/13 145 lb 12.8 oz (66.134 kg)  04/09/13 144 lb (65.318 kg)  05/16/12 147 lb (66.679 kg)     Past Medical History  Diagnosis Date  . Hypertension   . CAD (coronary artery disease)   . Osteoarthritis   . DDD (degenerative disc disease)   . Spinal stenosis   . Heart murmur   . Shortness of breath   . Myocardial infarct     11/05/11 , 1999  . Anxiety   . Pneumonia     hx of double pneumonia   . GERD (gastroesophageal reflux disease)   . Chronic kidney disease     hxof multiple kidney stones   . Cancer     hx of skin cancery     Current Outpatient Prescriptions  Medication Sig Dispense Refill  . acetaminophen (TYLENOL) 500 MG tablet Take 2,000 mg by mouth daily.      Marland Kitchen amiodarone (PACERONE) 200 MG tablet TAKE 1 TABLET DAILY  90 tablet  1  . aspirin EC 81 MG tablet Take 81 mg by mouth daily.      Marland Kitchen atorvastatin (LIPITOR) 40 MG tablet Take 1 tablet (40  mg total) by mouth daily.  90 tablet  3  . beta carotene w/minerals (OCUVITE) tablet Take 1 tablet by mouth daily.       . clopidogrel (PLAVIX) 75 MG tablet TAKE 1 TABLET DAILY  30 tablet  1  . docusate sodium (COLACE) 100 MG capsule Take 100-200 mg by mouth 2 (two) times daily. Take one tablet in the morning and two tablets in the evening.      . furosemide (LASIX) 40 MG tablet TAKE 2 TABLETS IN THE MORNING  180 tablet  0  . isosorbide mononitrate (IMDUR) 60 MG 24 hr tablet TAKE 1 TABLET DAILY  90 tablet  3  . loratadine (CLARITIN) 10 MG tablet Take 10 mg by mouth daily as needed. For allergies      . Misc Natural Products (NARCOSOFT HERBAL LAX PO) Take by mouth.      . Misc Natural Products (OSTEO BI-FLEX ADV JOINT SHIELD) TABS Take 1 tablet by mouth 2 (two) times daily.      . Multiple Vitamins-Minerals (MULTIVITAMINS THER.  W/MINERALS) TABS Take 1 tablet by mouth daily.       . nitroGLYCERIN (NITROSTAT) 0.4 MG SL tablet Place 1 tablet (0.4 mg total) under the tongue every 5 (five) minutes as needed. For chest pain.  30 tablet  5  . omeprazole (PRILOSEC) 20 MG capsule Take 1 capsule (20 mg total) by mouth daily.      Marland Kitchen OVER THE COUNTER MEDICATION Herb-lax two in the am and 2 in pm      . potassium citrate (UROCIT-K) 10 MEQ (1080 MG) SR tablet Take 10 mEq by mouth daily.       Marland Kitchen SYNTHROID 75 MCG tablet Take 75 mcg by mouth daily before breakfast.       . triamcinolone cream (KENALOG) 0.5 % Apply 1 application topically 2 (two) times daily.        No current facility-administered medications for this visit.    Allergies:    Allergies  Allergen Reactions  . Cocaine     Social History:  The patient  reports that he has never smoked. He has never used smokeless tobacco. He reports that he does not drink alcohol or use illicit drugs.   Family History:  The patient's family history includes Cancer in his father; Coronary artery disease in his father; Stroke in his mother.   ROS:  Please see  the history of present illness.  No nausea, vomiting.  No fevers, chills.  No focal weakness.  No dysuria.    All other systems reviewed and negative.   PHYSICAL EXAM: VS:  BP 114/71  Pulse 101  Ht 5\' 6"  (1.676 m)  Wt 145 lb 12.8 oz (66.134 kg)  BMI 23.54 kg/m2 Well nourished, well developed, in no acute distress HEENT: normal Neck: no JVD, no carotid bruits Cardiac:  normal S1, S2; RRR; 3/6 systolic murmur Lungs:  clear to auscultation bilaterally, no wheezing, rhonchi or rales Abd: soft, nontender, no hepatomegaly Ext: mild right leg edema Skin: warm and dry Neuro:   no focal abnormalities noted      ASSESSMENT AND PLAN:  Aortic valve disorders  Notes: Aortic stenosis by exam.  This was moderate by echocardiogram done in May of 2013. I do not think this would limit him in terms of urologic procedure. No syncope. I think his dyspnea is multifactorial and would not resolve just with treatment of his valve.  He is quite debilitated now I do not think he would be a good candidate for TAVR.  He requires a lot of support to get out of a chair. He cannot walk to the bathroom and now has a condom catheter. He still has significant back pain which limits him.    2. Coronary atherosclerosis of native coronary artery and risk factors. LAB: Statin Panel Notes: No angina. Recent MI.  He is not a candidate for heart catheterization. LDL controlled in 2012. WIll plan for statin panel in the near future. Lipids from April 2014 reviewed. Well controlled. Continue atorvastatin 40 mg daily.   HTN well controlled. Lipids to be rechecked today.   3. Dyspnea   Notes: Continue to titrate Lasix as needed.  Likely related to deconditioning as he appears euvolemic. He is geting more active as the weather improves.  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   4. Others  Notes: OK to stop plavix 5 days prior to skin surgery or  epidural.    Procedures  Signed, Mina Marble, MD, [facility] 11/21/2013 4:27 PM

## 2013-11-22 LAB — HEPATIC FUNCTION PANEL
ALBUMIN: 3.9 g/dL (ref 3.5–5.2)
ALT: 16 U/L (ref 0–53)
AST: 25 U/L (ref 0–37)
Alkaline Phosphatase: 70 U/L (ref 39–117)
Bilirubin, Direct: 0.1 mg/dL (ref 0.0–0.3)
Total Bilirubin: 0.6 mg/dL (ref 0.2–1.2)
Total Protein: 7 g/dL (ref 6.0–8.3)

## 2013-11-22 LAB — LIPID PANEL
CHOLESTEROL: 172 mg/dL (ref 0–200)
HDL: 54.7 mg/dL (ref >39.00)
LDL CALC: 101 mg/dL — AB (ref 0–99)
NonHDL: 117.3
Total CHOL/HDL Ratio: 3
Triglycerides: 83 mg/dL (ref 0.0–149.0)
VLDL: 16.6 mg/dL (ref 0.0–40.0)

## 2013-11-23 ENCOUNTER — Ambulatory Visit: Payer: MEDICARE | Admitting: Interventional Cardiology

## 2013-11-27 ENCOUNTER — Other Ambulatory Visit: Payer: Self-pay | Admitting: Cardiology

## 2013-11-27 DIAGNOSIS — E785 Hyperlipidemia, unspecified: Secondary | ICD-10-CM

## 2013-12-13 ENCOUNTER — Encounter: Payer: Self-pay | Admitting: *Deleted

## 2014-01-10 ENCOUNTER — Other Ambulatory Visit: Payer: Self-pay | Admitting: Interventional Cardiology

## 2014-02-26 ENCOUNTER — Other Ambulatory Visit: Payer: Self-pay | Admitting: Interventional Cardiology

## 2014-03-16 ENCOUNTER — Other Ambulatory Visit: Payer: Self-pay | Admitting: Interventional Cardiology

## 2014-04-03 ENCOUNTER — Other Ambulatory Visit: Payer: Self-pay | Admitting: Interventional Cardiology

## 2014-04-29 ENCOUNTER — Telehealth: Payer: Self-pay | Admitting: Interventional Cardiology

## 2014-04-29 NOTE — Telephone Encounter (Signed)
New problem   Per pt's daughter pt as had some drawing to side of face and confusion. Pt want an appt today.

## 2014-04-29 NOTE — Telephone Encounter (Signed)
Daughter called stating she found out late last PM from sitter that Shaun [name] had some "drawing in his face" and confusion that lasted a few hours.  No problems with weakness in extremities or difficulty speaking.  VS were good. Today seems fine.  No confusion and no drawing in his face.  She wants to make Dr. Irish Lack aware since he is hard to transport to hospital and to office.  Advised Dr. Irish Lack is in office and will advise him.  Spoke w/ him who suggests that he just be monitored since he is feeling good today. Notified daughter and if symptoms return and becomes worse then to take him to ER.  She verbalizes understanding.

## 2014-05-24 ENCOUNTER — Other Ambulatory Visit: Payer: MEDICARE

## 2014-05-27 ENCOUNTER — Other Ambulatory Visit (INDEPENDENT_AMBULATORY_CARE_PROVIDER_SITE_OTHER): Payer: MEDICARE

## 2014-05-27 DIAGNOSIS — E785 Hyperlipidemia, unspecified: Secondary | ICD-10-CM

## 2014-05-27 LAB — HEPATIC FUNCTION PANEL
ALT: 15 U/L (ref 0–53)
AST: 23 U/L (ref 0–37)
Albumin: 3.9 g/dL (ref 3.5–5.2)
Alkaline Phosphatase: 77 U/L (ref 39–117)
Bilirubin, Direct: 0 mg/dL (ref 0.0–0.3)
Total Bilirubin: 0.8 mg/dL (ref 0.2–1.2)
Total Protein: 7.1 g/dL (ref 6.0–8.3)

## 2014-05-27 LAB — LIPID PANEL
CHOL/HDL RATIO: 3
Cholesterol: 153 mg/dL (ref 0–200)
HDL: 45.2 mg/dL (ref >39.00)
LDL CALC: 75 mg/dL (ref 0–99)
NONHDL: 107.8
Triglycerides: 162 mg/dL — ABNORMAL HIGH (ref 0.0–149.0)
VLDL: 32.4 mg/dL (ref 0.0–40.0)

## 2014-05-28 ENCOUNTER — Telehealth: Payer: Self-pay | Admitting: Interventional Cardiology

## 2014-05-28 NOTE — Telephone Encounter (Signed)
I called the patient's wife with her lipid results that were reviewed by Dr. Irish Lack. They inquired about Shaun [name]'s results. Results of his lipid/ liver profile from 05/27/14 were given to him. I advised that Dr. Irish Lack had not signed off, but I did not forsee any changes. I will forward this message to MD for review as the patient's labs from 12/7 are not in Dr. Hassell Done basket. I advised I would call back if any changes. They are agreeable.

## 2014-10-15 IMAGING — CR DG CHEST 1V PORT
1 series · 1 of 1 positions shown · non-contrast
Comparison: 04/11/2012

CLINICAL DATA: fluid overload

PORTABLE CHEST - 1 VIEW

[AP]
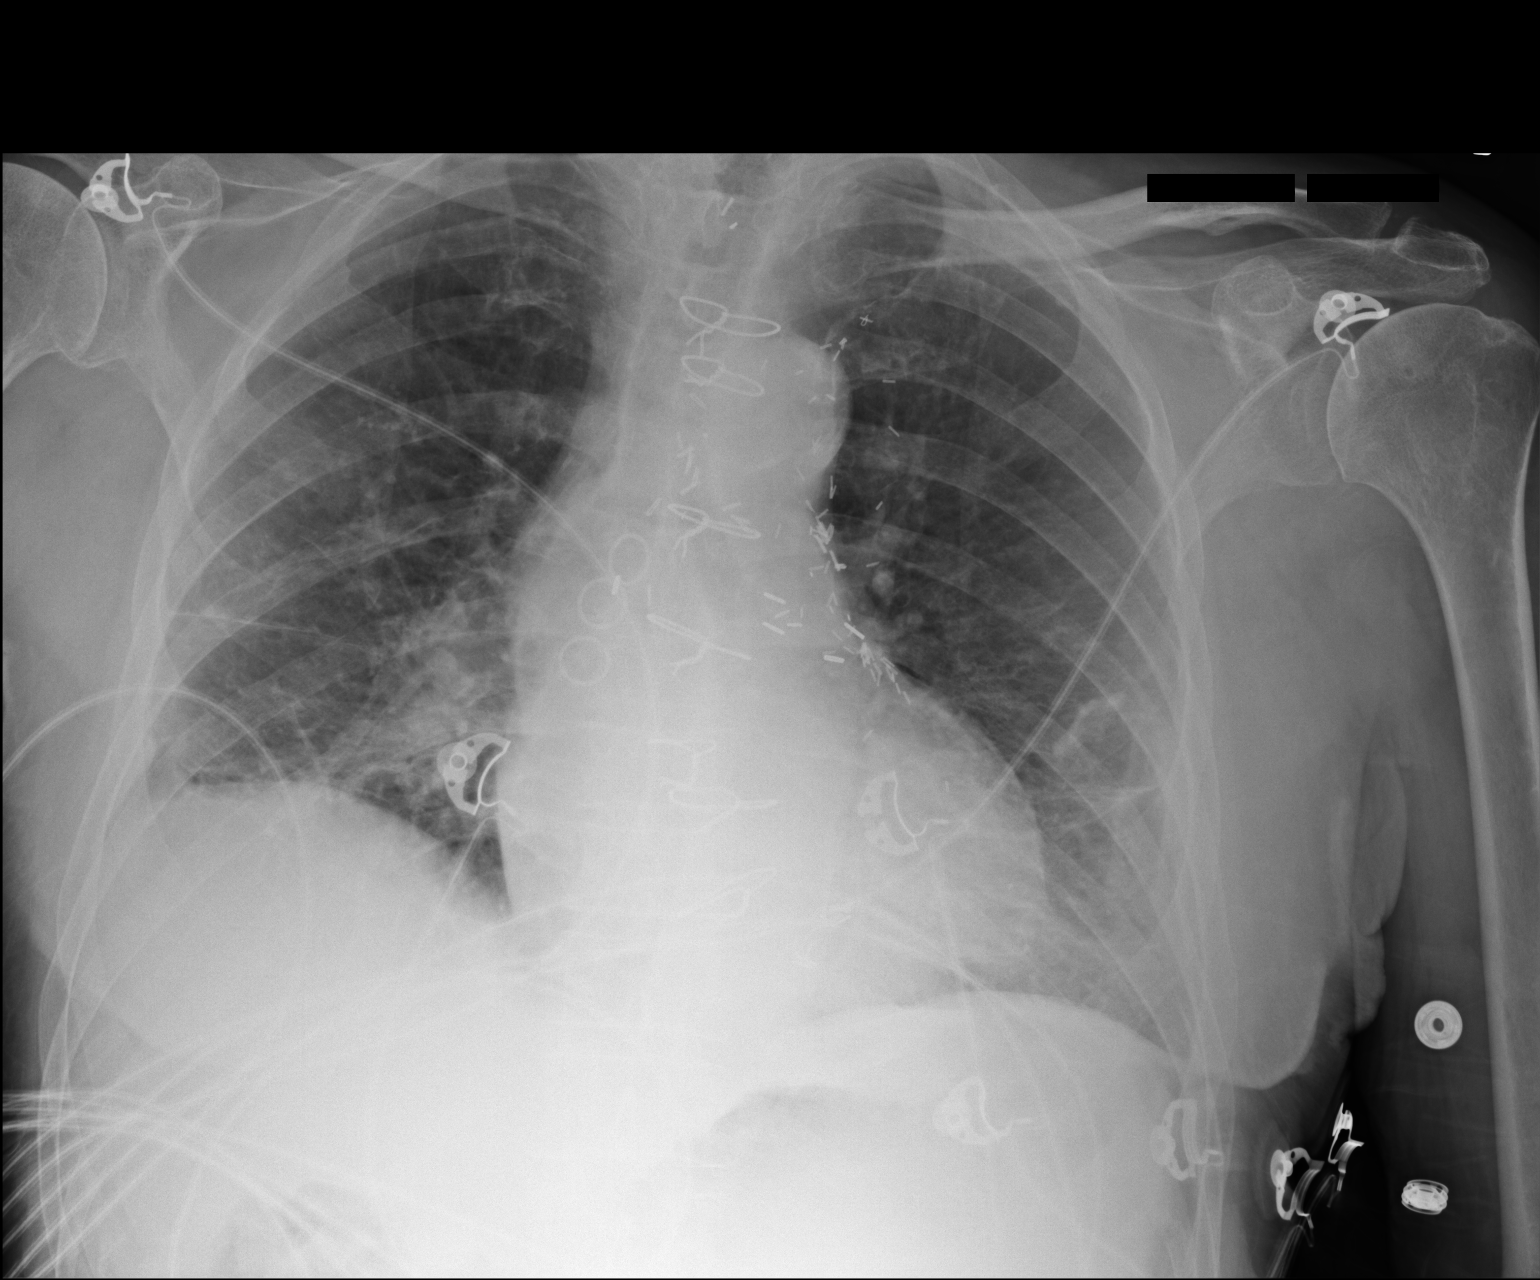

[1 of 1 positions shown; findings below may reference images not displayed]

FINDINGS: Cardiomediastinal silhouette is stable.  Status post
CABG.  No acute infiltrate or pulmonary edema is stable bilateral
basilar scarring.  Stable scarring in the lingula.  No pleural
effusion.  Again noted mild elevation of the right hemidiaphragm.
IMPRESSION: No active disease.  Stable bilateral basilar scarring.

## 2014-11-02 ENCOUNTER — Other Ambulatory Visit: Payer: Self-pay | Admitting: Interventional Cardiology

## 2014-11-05 ENCOUNTER — Other Ambulatory Visit: Payer: Self-pay | Admitting: Interventional Cardiology

## 2014-11-22 ENCOUNTER — Ambulatory Visit (INDEPENDENT_AMBULATORY_CARE_PROVIDER_SITE_OTHER): Payer: MEDICARE | Admitting: Interventional Cardiology

## 2014-11-22 ENCOUNTER — Encounter: Payer: Self-pay | Admitting: Interventional Cardiology

## 2014-11-22 VITALS — BP 102/64 | HR 82 | Ht 66.0 in | Wt 138.1 lb

## 2014-11-22 DIAGNOSIS — I2510 Atherosclerotic heart disease of native coronary artery without angina pectoris: Secondary | ICD-10-CM

## 2014-11-22 DIAGNOSIS — I350 Nonrheumatic aortic (valve) stenosis: Secondary | ICD-10-CM | POA: Diagnosis not present

## 2014-11-22 DIAGNOSIS — I158 Other secondary hypertension: Secondary | ICD-10-CM | POA: Diagnosis not present

## 2014-11-22 NOTE — Patient Instructions (Signed)
**Note De-identified  Obfuscation** Medication Instructions:  Same-No change  Labwork: None  Testing/Procedures: None  Follow-Up: Your physician wants you to follow-up in: 1 year. You will receive a reminder letter in the mail two months in advance. If you don't receive a letter, please call our office to schedule the follow-up appointment.      

## 2014-11-25 NOTE — Progress Notes (Signed)
Patient ID: Shaun [name], male   DOB: 04-07-1915, 81 y.o.   MRN: [id number]     Cardiology Office Note   Date:  11/25/2014   ID:  Shaun [name], DOB [birth year], MRN [id number]  PCP:  Donnie Coffin, MD    No chief complaint on file. f/u aortic stenosis, CAD   Wt Readings from Last 3 Encounters:  11/22/14 138 lb 1.9 oz (62.651 kg)  11/21/13 145 lb 12.8 oz (66.134 kg)  04/09/13 144 lb (65.318 kg)       History of Present Illness: Shaun [name] is a 81 y.o. male  with coronary artery disease who had an inferior MI on May 17,2013. He had a long recovery from this and was medically treated. He is doing well now. He was found to have a kidney stone and had cystoscopy in 2013. He did well with this. His left ventricular function was mildly reduced at the time of his MI. He does not report any orthopnea, PND, or chest pain.  He denies lightheadedness or syncope.  He now has a urinary catheter. His activity is limited by joint pains.  His BP is checked regularly and well controlled.  He has not had to use SL NTG recently. No palpitations.    Past Medical History  Diagnosis Date  . Hypertension   . CAD (coronary artery disease)   . Osteoarthritis   . DDD (degenerative disc disease)   . Spinal stenosis   . Heart murmur   . Shortness of breath   . Myocardial infarct     11/05/11 , 1999  . Anxiety   . Pneumonia     hx of double pneumonia   . GERD (gastroesophageal reflux disease)   . Chronic kidney disease     hxof multiple kidney stones   . Cancer     hx of skin cancery   . Elevated cholesterol     ELEVATED CHOLESTEROL  . Arthritis     ARTHRITIS  . Kidney stone     KIDNEY STONE ,DR.Noonan  . Neuralgia, post-herpetic     POST HERPETIC NEURALGIA    Past Surgical History  Procedure Laterality Date  . Tonsillectomy    . Coronary artery bypass graft      1999  . Lithotripsy    . Hernia repair      bilateral 1947   . Cardiac catheterization    . Nasal septum  surgery    . Cystoscopy with ureteroscopy  03/29/2012    Procedure: CYSTOSCOPY WITH URETEROSCOPY;  Surgeon: Molli Hazard, MD;  Location: [facility];  Service: Urology;  Laterality: Left;  . Inguinal hernia repair  04/29/2012    Procedure: HERNIA REPAIR INGUINAL INCARCERATED;  Surgeon: Ralene Ok, MD;  Location: [place];  Service: General;  Laterality: Left;  left incarerated inguinal hernia repair with mesh     Current Outpatient Prescriptions  Medication Sig Dispense Refill  . acetaminophen (TYLENOL) 500 MG tablet Take 2,000 mg by mouth daily.    Marland Kitchen amiodarone (PACERONE) 200 MG tablet TAKE 1 TABLET DAILY 90 tablet 3  . aspirin EC 81 MG tablet Take 81 mg by mouth daily.    Marland Kitchen atorvastatin (LIPITOR) 40 MG tablet TAKE 1 TABLET DAILY 90 tablet 0  . beta carotene w/minerals (OCUVITE) tablet Take 1 tablet by mouth daily.     . clopidogrel (PLAVIX) 75 MG tablet TAKE 1 TABLET DAILY 90 tablet 0  . docusate sodium (COLACE) 100 MG capsule Take 100-200 mg  by mouth 2 (two) times daily. Take one tablet in the morning and two tablets in the evening.    . furosemide (LASIX) 40 MG tablet TAKE 2 TABLETS IN THE MORNING 180 tablet 4  . isosorbide mononitrate (IMDUR) 60 MG 24 hr tablet TAKE 1 TABLET DAILY 90 tablet 2  . loratadine (CLARITIN) 10 MG tablet Take 10 mg by mouth daily as needed. For allergies    . Misc Natural Products (NARCOSOFT HERBAL LAX PO) Take 2 tablets by mouth 2 (two) times daily.     . Misc Natural Products (OSTEO BI-FLEX ADV JOINT SHIELD) TABS Take 1 tablet by mouth 2 (two) times daily.    . Multiple Vitamins-Minerals (MULTIVITAMINS THER. W/MINERALS) TABS Take 1 tablet by mouth daily.     . nitroGLYCERIN (NITROSTAT) 0.4 MG SL tablet Place 1 tablet (0.4 mg total) under the tongue every 5 (five) minutes as needed. For chest pain. 30 tablet 5  . omeprazole (PRILOSEC) 20 MG capsule Take 1 capsule (20 mg total) by mouth daily.    Marland Kitchen OVER THE COUNTER MEDICATION Herb-lax two in the am and 2 in  pm    . polymixin-bacitracin (POLYSPORIN) 500-10000 UNIT/GM OINT ointment Apply 1 application topically 2 (two) times daily.    . potassium citrate (UROCIT-K) 10 MEQ (1080 MG) SR tablet Take 10 mEq by mouth daily.     Marland Kitchen SYNTHROID 100 MCG tablet Take 100 mcg by mouth daily.    Marland Kitchen triamcinolone cream (KENALOG) 0.5 % Apply 1 application topically 2 (two) times daily.      No current facility-administered medications for this visit.    Allergies:   Cocaine    Social History:  The patient  reports that he has quit smoking. His smoking use included Pipe. He has never used smokeless tobacco. He reports that he does not drink alcohol or use illicit drugs.   Family History:  The patient's *family history includes Alzheimer's disease in his brother and brother; Cancer in his father; Coronary artery disease in his father; Heart disease in his brother, brother, and brother; Stroke in his mother.    ROS:  Please see the history of present illness.   Otherwise, review of systems are positive for joint pains.   All other systems are reviewed and negative.    PHYSICAL EXAM: VS:  BP 102/64 mmHg  Pulse 82  Ht 5\' 6"  (1.676 m)  Wt 138 lb 1.9 oz (62.651 kg)  BMI 22.30 kg/m2 , BMI Body mass index is 22.3 kg/(m^2). GEN: Frail HEENT: normal Neck: no JVD, carotid bruits, or masses Cardiac: UEA;5/4 systolic  murmur, rubs, or gallops,no edema  Respiratory:  clear to auscultation bilaterally, normal work of breathing GI: soft, nontender, nondistended, + BS MS: no deformity or atrophy Skin: bruising Neuro:  Generally weak Psych: euthymic mood, flat affect   EKG:   The ekg ordered today demonstrates NSR, inferior MI   Recent Labs: 05/27/2014: ALT 15   Lipid Panel    Component Value Date/Time   CHOL 153 05/27/2014 1006   TRIG 162.0* 05/27/2014 1006   HDL 45.20 05/27/2014 1006   CHOLHDL 3 05/27/2014 1006   VLDL 32.4 05/27/2014 1006   LDLCALC 75 05/27/2014 1006     Other studies  Reviewed: Additional studies/ records that were reviewed today with results demonstrating: prior echo.   ASSESSMENT AND PLAN:  Aortic stenosis by exam.  This was moderate by echocardiogram done in May of 2013.  No syncope. I think his dyspnea is multifactorial  and would not resolve just with treatment of his valve. He is quite debilitated now I do not think he would be a good candidate for TAVR. He requires a lot of support to get out of a chair. He cannot walk to the bathroom and now has a condom catheter. He still has significant back pain which limits him. Currently, not symptomatic from the AS.   2. Coronary atherosclerosis of native coronary artery and risk factors.  Notes: No angina.  He is not a candidate for heart catheterization. LDL controlled in 2012.  Lipids Well controlled in 12/15. Continue atorvastatin 40 mg daily. HTN well controlled.   3. Dyspnea   Notes: Continue to titrate Lasix as needed. Likely related to deconditioning as he appears euvolemic. Using urinary catheter due to inabiliaty to get to the bathroom from joint issues and enlarged prostate.            Current medicines are reviewed at length with the patient today.  The patient concerns regarding his medicines were addressed.  The following changes have been made:  No change  Labs/ tests ordered today include:  Orders Placed This Encounter  Procedures  . EKG 12-Lead    Recommend 150 minutes/week of aerobic exercise Low fat, low carb, high fiber diet recommended  Disposition:   FU in 1 year  Teresita Madura., MD  11/25/2014 11:07 AM    [facility] [place], [place], [place]  [address] Phone: [phone]; Fax: [phone]

## 2014-12-05 ENCOUNTER — Other Ambulatory Visit: Payer: Self-pay | Admitting: Interventional Cardiology

## 2014-12-16 ENCOUNTER — Other Ambulatory Visit: Payer: Self-pay

## 2015-01-31 ENCOUNTER — Other Ambulatory Visit: Payer: Self-pay | Admitting: Interventional Cardiology

## 2015-02-01 ENCOUNTER — Other Ambulatory Visit: Payer: Self-pay | Admitting: Interventional Cardiology

## 2015-03-06 ENCOUNTER — Other Ambulatory Visit: Payer: Self-pay | Admitting: Interventional Cardiology

## 2015-05-02 ENCOUNTER — Other Ambulatory Visit: Payer: Self-pay | Admitting: Interventional Cardiology

## 2015-06-05 ENCOUNTER — Encounter: Payer: Self-pay | Admitting: Interventional Cardiology

## 2015-06-25 ENCOUNTER — Encounter: Payer: Self-pay | Admitting: Interventional Cardiology

## 2015-07-23 ENCOUNTER — Other Ambulatory Visit: Payer: Self-pay

## 2015-07-23 MED ORDER — FUROSEMIDE 40 MG PO TABS: ORAL_TABLET | ORAL | Status: DC

## 2015-08-10 ENCOUNTER — Inpatient Hospital Stay (HOSPITAL_COMMUNITY)
Admission: EM | Admit: 2015-08-10 | Discharge: 2015-08-12 | DRG: 280 | Disposition: A | Discharge status: Home Health | Payer: MEDICARE | Attending: Internal Medicine | Admitting: Internal Medicine

## 2015-08-10 ENCOUNTER — Encounter (HOSPITAL_COMMUNITY): Payer: Self-pay | Admitting: Emergency Medicine

## 2015-08-10 DIAGNOSIS — R410 Disorientation, unspecified: Secondary | ICD-10-CM | POA: Diagnosis not present

## 2015-08-10 DIAGNOSIS — I252 Old myocardial infarction: Secondary | ICD-10-CM

## 2015-08-10 DIAGNOSIS — I509 Heart failure, unspecified: Secondary | ICD-10-CM

## 2015-08-10 DIAGNOSIS — E039 Hypothyroidism, unspecified: Secondary | ICD-10-CM | POA: Diagnosis present

## 2015-08-10 DIAGNOSIS — R32 Unspecified urinary incontinence: Secondary | ICD-10-CM | POA: Diagnosis present

## 2015-08-10 DIAGNOSIS — F05 Delirium due to known physiological condition: Secondary | ICD-10-CM | POA: Diagnosis present

## 2015-08-10 DIAGNOSIS — I214 Non-ST elevation (NSTEMI) myocardial infarction: Secondary | ICD-10-CM | POA: Diagnosis present

## 2015-08-10 DIAGNOSIS — Z7982 Long term (current) use of aspirin: Secondary | ICD-10-CM

## 2015-08-10 DIAGNOSIS — I5023 Acute on chronic systolic (congestive) heart failure: Secondary | ICD-10-CM | POA: Diagnosis present

## 2015-08-10 DIAGNOSIS — I2510 Atherosclerotic heart disease of native coronary artery without angina pectoris: Secondary | ICD-10-CM | POA: Diagnosis present

## 2015-08-10 DIAGNOSIS — Z66 Do not resuscitate: Secondary | ICD-10-CM | POA: Diagnosis present

## 2015-08-10 DIAGNOSIS — I130 Hypertensive heart and chronic kidney disease with heart failure and stage 1 through stage 4 chronic kidney disease, or unspecified chronic kidney disease: Principal | ICD-10-CM | POA: Diagnosis present

## 2015-08-10 DIAGNOSIS — N189 Chronic kidney disease, unspecified: Secondary | ICD-10-CM | POA: Diagnosis present

## 2015-08-10 DIAGNOSIS — E871 Hypo-osmolality and hyponatremia: Secondary | ICD-10-CM | POA: Diagnosis present

## 2015-08-10 DIAGNOSIS — I10 Essential (primary) hypertension: Secondary | ICD-10-CM | POA: Diagnosis present

## 2015-08-10 DIAGNOSIS — Z8701 Personal history of pneumonia (recurrent): Secondary | ICD-10-CM

## 2015-08-10 DIAGNOSIS — I350 Nonrheumatic aortic (valve) stenosis: Secondary | ICD-10-CM | POA: Diagnosis present

## 2015-08-10 DIAGNOSIS — R0902 Hypoxemia: Secondary | ICD-10-CM

## 2015-08-10 DIAGNOSIS — Z87891 Personal history of nicotine dependence: Secondary | ICD-10-CM

## 2015-08-10 DIAGNOSIS — Z951 Presence of aortocoronary bypass graft: Secondary | ICD-10-CM

## 2015-08-10 DIAGNOSIS — F0390 Unspecified dementia without behavioral disturbance: Secondary | ICD-10-CM | POA: Diagnosis present

## 2015-08-10 DIAGNOSIS — N390 Urinary tract infection, site not specified: Secondary | ICD-10-CM | POA: Diagnosis present

## 2015-08-10 DIAGNOSIS — R197 Diarrhea, unspecified: Secondary | ICD-10-CM | POA: Diagnosis present

## 2015-08-10 LAB — URINALYSIS, ROUTINE W REFLEX MICROSCOPIC
Bilirubin Urine: NEGATIVE
Glucose, UA: NEGATIVE mg/dL
Hgb urine dipstick: NEGATIVE
Ketones, ur: NEGATIVE mg/dL
Nitrite: POSITIVE — AB
Protein, ur: NEGATIVE mg/dL
Specific Gravity, Urine: 1.015 (ref 1.005–1.030)
pH: 5.5 (ref 5.0–8.0)

## 2015-08-10 LAB — I-STAT CG4 LACTIC ACID, ED: Lactic Acid, Venous: 1.27 mmol/L (ref 0.5–2.0)

## 2015-08-10 LAB — COMPREHENSIVE METABOLIC PANEL
ALT: 14 U/L — ABNORMAL LOW (ref 17–63)
AST: 23 U/L (ref 15–41)
Albumin: 3 g/dL — ABNORMAL LOW (ref 3.5–5.0)
Alkaline Phosphatase: 71 U/L (ref 38–126)
Anion gap: 13 (ref 5–15)
BUN: 34 mg/dL — ABNORMAL HIGH (ref 6–20)
CO2: 21 mmol/L — ABNORMAL LOW (ref 22–32)
Calcium: 9.3 mg/dL (ref 8.9–10.3)
Chloride: 95 mmol/L — ABNORMAL LOW (ref 101–111)
Creatinine, Ser: 1.38 mg/dL — ABNORMAL HIGH (ref 0.61–1.24)
GFR calc Af Amer: 47 mL/min — ABNORMAL LOW (ref >60)
GFR calc non Af Amer: 41 mL/min — ABNORMAL LOW (ref >60)
Glucose, Bld: 126 mg/dL — ABNORMAL HIGH (ref 65–99)
Potassium: 3.7 mmol/L (ref 3.5–5.1)
Sodium: 129 mmol/L — ABNORMAL LOW (ref 135–145)
Total Bilirubin: 0.8 mg/dL (ref 0.3–1.2)
Total Protein: 6.4 g/dL — ABNORMAL LOW (ref 6.5–8.1)

## 2015-08-10 LAB — CBC WITH DIFFERENTIAL/PLATELET
Basophils Absolute: 0 10*3/uL (ref 0.0–0.1)
Basophils Relative: 0 %
Eosinophils Absolute: 0 10*3/uL (ref 0.0–0.7)
Eosinophils Relative: 0 %
HCT: 39.2 % (ref 39.0–52.0)
Hemoglobin: 12.9 g/dL — ABNORMAL LOW (ref 13.0–17.0)
Lymphocytes Relative: 9 %
Lymphs Abs: 0.9 10*3/uL (ref 0.7–4.0)
MCH: 32.1 pg (ref 26.0–34.0)
MCHC: 32.9 g/dL (ref 30.0–36.0)
MCV: 97.5 fL (ref 78.0–100.0)
Monocytes Absolute: 0.8 10*3/uL (ref 0.1–1.0)
Monocytes Relative: 8 %
Neutro Abs: 8.4 10*3/uL — ABNORMAL HIGH (ref 1.7–7.7)
Neutrophils Relative %: 83 %
Platelets: 201 10*3/uL (ref 150–400)
RBC: 4.02 MIL/uL — ABNORMAL LOW (ref 4.22–5.81)
RDW: 15 % (ref 11.5–15.5)
WBC: 10 10*3/uL (ref 4.0–10.5)

## 2015-08-10 LAB — TROPONIN I: Troponin I: 0.11 ng/mL — ABNORMAL HIGH (ref <0.031)

## 2015-08-10 LAB — URINE MICROSCOPIC-ADD ON
RBC / HPF: NONE SEEN RBC/hpf (ref 0–5)
Squamous Epithelial / LPF: NONE SEEN

## 2015-08-10 NOTE — ED Notes (Signed)
Pt with chief complaint of shortness of breath. Pt with reported labored breathing and low 02 sats upon EMS arrival.

## 2015-08-10 NOTE — ED Provider Notes (Signed)
CSN: [id number]     Arrival date & time 08/10/15  2138 History   First MD Initiated Contact with Patient 08/10/15 2140     Chief Complaint  Patient presents with  . Shortness of Breath     (Consider location/radiation/quality/duration/timing/severity/associated sxs/prior Treatment) HPI Patient presents to the Emergency Department complaining of SOB and altered mental status per daughter and wife. Patient has a PMH of CAD, HTN, and MI. Patient states that he has had a cough, decreased appetite and SOB for approximately 1-2 weeks. He went to his PCP where he was placed on antibiotics. Today the patient had lots of diarrhea, and his weakness and SOB worsened. Per family patient seems confused and somewhat altered. Wife states that he got out of the bed and was determined to turn down the thermostat. Patient did not lose consciousness or fall, but his wife was concerned that he was going to fall. His daughter states that he was wheezing earlier in the day. Patient is alert and oriented x3. Patient presented to the ED with labored breathing and low O2 sats. He currently is on 4L and his SpO2 is 93%. He denies chest pain, palpitations, syncope, dizziness, lightheadedness, abdominal pain, hematochezia, nausea, vomiting, constipation, vision changes, blurry vision, hearing loss, numbness, tingling, swelling, trouble swallowing, fever, diaphoresis, chills.  Past Medical History  Diagnosis Date  . Hypertension   . CAD (coronary artery disease)   . Osteoarthritis   . DDD (degenerative disc disease)   . Spinal stenosis   . Heart murmur   . Shortness of breath   . Myocardial infarct ([place])     11/05/11 , 1999  . Anxiety   . Pneumonia     hx of double pneumonia   . GERD (gastroesophageal reflux disease)   . Chronic kidney disease     hxof multiple kidney stones   . Cancer ([place])     hx of skin cancery   . Elevated cholesterol     ELEVATED CHOLESTEROL  . Arthritis     ARTHRITIS  . Kidney stone    KIDNEY STONE ,DR.[place]  . Neuralgia, post-herpetic     POST HERPETIC NEURALGIA   Past Surgical History  Procedure Laterality Date  . Tonsillectomy    . Coronary artery bypass graft      1999  . Lithotripsy    . Hernia repair      bilateral 1947   . Cardiac catheterization    . Nasal septum surgery    . Cystoscopy with ureteroscopy  03/29/2012    Procedure: CYSTOSCOPY WITH URETEROSCOPY;  Surgeon: Molli Hazard, MD;  Location: [facility];  Service: Urology;  Laterality: Left;  . Inguinal hernia repair  04/29/2012    Procedure: HERNIA REPAIR INGUINAL INCARCERATED;  Surgeon: Ralene Ok, MD;  Location: [place];  Service: General;  Laterality: Left;  left incarerated inguinal hernia repair with mesh   Family History  Problem Relation Age of Onset  . Cancer Father   . Coronary artery disease Father   . Stroke Mother   . Heart disease Brother   . Heart disease Brother   . Alzheimer's disease Brother   . Alzheimer's disease Brother   . Heart disease Brother    Social History  Substance Use Topics  . Smoking status: Former Smoker    Types: Pipe  . Smokeless tobacco: Never Used  . Alcohol Use: No    Review of Systems All other systems negative except as documented in the HPI. All pertinent positives  and negatives as reviewed in the HPI.   Allergies  Cocaine  Home Medications   Prior to Admission medications   Medication Sig Start Date End Date Taking? Authorizing Provider  acetaminophen (TYLENOL) 500 MG tablet Take 2,000 mg by mouth daily.    Historical Provider, MD  amiodarone (PACERONE) 200 MG tablet Take 1 tablet (200 mg total) by mouth daily. 03/06/15   Jettie Booze, MD  aspirin EC 81 MG tablet Take 81 mg by mouth daily.    Historical Provider, MD  atorvastatin (LIPITOR) 40 MG tablet Take 1 tablet (40 mg total) by mouth daily. 05/02/15   Jettie Booze, MD  beta carotene w/minerals (OCUVITE) tablet Take 1 tablet by mouth daily.     Historical  Provider, MD  clopidogrel (PLAVIX) 75 MG tablet TAKE 1 TABLET DAILY 02/03/15   Jettie Booze, MD  docusate sodium (COLACE) 100 MG capsule Take 100-200 mg by mouth 2 (two) times daily. Take one tablet in the morning and two tablets in the evening.    Historical Provider, MD  furosemide (LASIX) 40 MG tablet TAKE 2 TABLETS IN THE MORNING 07/23/15   Jettie Booze, MD  isosorbide mononitrate (IMDUR) 60 MG 24 hr tablet TAKE 1 TABLET DAILY 12/05/14   Jettie Booze, MD  loratadine (CLARITIN) 10 MG tablet Take 10 mg by mouth daily as needed. For allergies    Historical Provider, MD  Misc Natural Products (NARCOSOFT HERBAL LAX PO) Take 2 tablets by mouth 2 (two) times daily.     Historical Provider, MD  Misc Natural Products (OSTEO BI-FLEX ADV JOINT SHIELD) TABS Take 1 tablet by mouth 2 (two) times daily.    Historical Provider, MD  Multiple Vitamins-Minerals (MULTIVITAMINS THER. W/MINERALS) TABS Take 1 tablet by mouth daily.     Historical Provider, MD  nitroGLYCERIN (NITROSTAT) 0.4 MG SL tablet Place 1 tablet (0.4 mg total) under the tongue every 5 (five) minutes as needed. For chest pain. 04/09/13   Jettie Booze, MD  omeprazole (PRILOSEC) 20 MG capsule Take 1 capsule (20 mg total) by mouth daily. 11/17/11   Jettie Booze, MD  OVER THE COUNTER MEDICATION Herb-lax two in the am and 2 in pm    Historical Provider, MD  polymixin-bacitracin (POLYSPORIN) 500-10000 UNIT/GM OINT ointment Apply 1 application topically 2 (two) times daily.    Historical Provider, MD  potassium citrate (UROCIT-K) 10 MEQ (1080 MG) SR tablet Take 10 mEq by mouth daily.  04/04/13   Historical Provider, MD  SYNTHROID 100 MCG tablet Take 100 mcg by mouth daily. 11/01/14   Historical Provider, MD  triamcinolone cream (KENALOG) 0.5 % Apply 1 application topically 2 (two) times daily.  11/09/13   Historical Provider, MD   BP 103/87 mmHg  Pulse 105  Temp(Src) 97.3 F (36.3 C) (Oral)  Resp 32  SpO2 93% Physical  Exam  Constitutional: He is oriented to person, place, and time. He appears well-developed and well-nourished.  HENT:  Head: Normocephalic and atraumatic.  Right Ear: External ear normal.  Left Ear: External ear normal.  Eyes: Conjunctivae and EOM are normal. Pupils are equal, round, and reactive to light.  Neck: Normal range of motion. Neck supple.  Cardiovascular: Intact distal pulses.   Murmur heard. Irregularly irregular rhythm. Tachycardic.   Pulmonary/Chest: Effort normal and breath sounds normal. No respiratory distress. He has no wheezes. He has no rales. He exhibits no tenderness.  Abdominal: Soft. Bowel sounds are normal. He exhibits no distension and no  mass. There is no tenderness. There is no rebound and no guarding.  Musculoskeletal: Normal range of motion.  Lymphadenopathy:    He has no cervical adenopathy.  Neurological: He is alert and oriented to person, place, and time.  Skin: Skin is warm and dry.  Psychiatric: He has a normal mood and affect. His behavior is normal. Judgment and thought content normal.    ED Course  Procedures (including critical care time) Labs Review Labs Reviewed  COMPREHENSIVE METABOLIC PANEL - Abnormal; Notable for the following:    Sodium 129 (*)    Chloride 95 (*)    CO2 21 (*)    Glucose, Bld 126 (*)    BUN 34 (*)    Creatinine, Ser 1.38 (*)    Total Protein 6.4 (*)    Albumin 3.0 (*)    ALT 14 (*)    GFR calc non Af Amer 41 (*)    GFR calc Af Amer 47 (*)    All other components within normal limits  CBC WITH DIFFERENTIAL/PLATELET - Abnormal; Notable for the following:    RBC 4.02 (*)    Hemoglobin 12.9 (*)    Neutro Abs 8.4 (*)    All other components within normal limits  TROPONIN I - Abnormal; Notable for the following:    Troponin I 0.11 (*)    All other components within normal limits  URINALYSIS, ROUTINE W REFLEX MICROSCOPIC (NOT AT [facility]) - Abnormal; Notable for the following:    Nitrite POSITIVE (*)    Leukocytes, UA  TRACE (*)    All other components within normal limits  URINE MICROSCOPIC-ADD ON - Abnormal; Notable for the following:    Bacteria, UA RARE (*)    Casts HYALINE CASTS (*)    Crystals CA OXALATE CRYSTALS (*)    All other components within normal limits  BRAIN NATRIURETIC PEPTIDE  I-STAT CG4 LACTIC ACID, ED    Imaging Review Dg Chest 2 View  08/11/2015  CLINICAL DATA:  82 year old male with right chest pain for days. EXAM: CHEST  2 VIEW COMPARISON:  04/30/2012 FINDINGS: Patient is post median sternotomy and CABG. Cardiomediastinal contours are unchanged. Bilateral perihilar and lower lobe alveolar opacities. Probable left pleural effusion. Scarring at the right costophrenic angle. Scarring in the right and left mid lung again seen. No pneumothorax. IMPRESSION: Bilateral perihilar lower lobe alveolar opacities concerning for pulmonary edema. Left pleural effusion. Findings suspicious for CHF. Right basilar and mid lung scarring. Electronically Signed   By: Jeb Levering M.D.   On: 08/11/2015 01:01   I have personally reviewed and evaluated these images and lab results as part of my medical decision-making.   EKG Interpretation None     0031 (2/20) Patient resting comfortably in bed, he does not have any complaints at this time.  MDM   Patient needed admission to the hospital for further evaluation and care of what appears to be a CHF exacerbation.  Patient was advised plan.  All questions were answered.  I will speak with the [facility] does have an oxygen demand as well and was placed on 4 L nasal cannula is most likely related to his aortic stenosis.  The wife states that his blood pressures normally run low   Dalia Heading, PA-C 08/11/15 0129  Jola Schmidt, MD 08/11/15 [phone]

## 2015-08-11 ENCOUNTER — Encounter (HOSPITAL_COMMUNITY): Payer: Self-pay | Admitting: Family Medicine

## 2015-08-11 ENCOUNTER — Inpatient Hospital Stay (HOSPITAL_COMMUNITY): Payer: MEDICARE

## 2015-08-11 ENCOUNTER — Emergency Department (HOSPITAL_COMMUNITY): Payer: MEDICARE

## 2015-08-11 DIAGNOSIS — F05 Delirium due to known physiological condition: Secondary | ICD-10-CM | POA: Diagnosis present

## 2015-08-11 DIAGNOSIS — I5023 Acute on chronic systolic (congestive) heart failure: Secondary | ICD-10-CM

## 2015-08-11 DIAGNOSIS — I214 Non-ST elevation (NSTEMI) myocardial infarction: Secondary | ICD-10-CM | POA: Diagnosis present

## 2015-08-11 DIAGNOSIS — I10 Essential (primary) hypertension: Secondary | ICD-10-CM | POA: Diagnosis not present

## 2015-08-11 DIAGNOSIS — F0390 Unspecified dementia without behavioral disturbance: Secondary | ICD-10-CM | POA: Diagnosis present

## 2015-08-11 DIAGNOSIS — Z8701 Personal history of pneumonia (recurrent): Secondary | ICD-10-CM | POA: Diagnosis not present

## 2015-08-11 DIAGNOSIS — I350 Nonrheumatic aortic (valve) stenosis: Secondary | ICD-10-CM | POA: Diagnosis not present

## 2015-08-11 DIAGNOSIS — I2510 Atherosclerotic heart disease of native coronary artery without angina pectoris: Secondary | ICD-10-CM

## 2015-08-11 DIAGNOSIS — E039 Hypothyroidism, unspecified: Secondary | ICD-10-CM | POA: Diagnosis present

## 2015-08-11 DIAGNOSIS — E871 Hypo-osmolality and hyponatremia: Secondary | ICD-10-CM | POA: Diagnosis present

## 2015-08-11 DIAGNOSIS — Z951 Presence of aortocoronary bypass graft: Secondary | ICD-10-CM | POA: Diagnosis not present

## 2015-08-11 DIAGNOSIS — N189 Chronic kidney disease, unspecified: Secondary | ICD-10-CM | POA: Diagnosis present

## 2015-08-11 DIAGNOSIS — R7989 Other specified abnormal findings of blood chemistry: Secondary | ICD-10-CM | POA: Diagnosis not present

## 2015-08-11 DIAGNOSIS — I158 Other secondary hypertension: Secondary | ICD-10-CM

## 2015-08-11 DIAGNOSIS — R410 Disorientation, unspecified: Secondary | ICD-10-CM | POA: Diagnosis present

## 2015-08-11 DIAGNOSIS — I130 Hypertensive heart and chronic kidney disease with heart failure and stage 1 through stage 4 chronic kidney disease, or unspecified chronic kidney disease: Secondary | ICD-10-CM | POA: Diagnosis present

## 2015-08-11 DIAGNOSIS — J9621 Acute and chronic respiratory failure with hypoxia: Secondary | ICD-10-CM | POA: Diagnosis not present

## 2015-08-11 DIAGNOSIS — N390 Urinary tract infection, site not specified: Secondary | ICD-10-CM | POA: Diagnosis not present

## 2015-08-11 DIAGNOSIS — Z66 Do not resuscitate: Secondary | ICD-10-CM | POA: Diagnosis present

## 2015-08-11 DIAGNOSIS — R32 Unspecified urinary incontinence: Secondary | ICD-10-CM | POA: Diagnosis present

## 2015-08-11 DIAGNOSIS — I252 Old myocardial infarction: Secondary | ICD-10-CM | POA: Diagnosis not present

## 2015-08-11 DIAGNOSIS — Z7982 Long term (current) use of aspirin: Secondary | ICD-10-CM | POA: Diagnosis not present

## 2015-08-11 DIAGNOSIS — Z87891 Personal history of nicotine dependence: Secondary | ICD-10-CM | POA: Diagnosis not present

## 2015-08-11 DIAGNOSIS — R197 Diarrhea, unspecified: Secondary | ICD-10-CM | POA: Diagnosis present

## 2015-08-11 LAB — CBC
HCT: 38.1 % — ABNORMAL LOW (ref 39.0–52.0)
Hemoglobin: 12.2 g/dL — ABNORMAL LOW (ref 13.0–17.0)
MCH: 31.3 pg (ref 26.0–34.0)
MCHC: 32 g/dL (ref 30.0–36.0)
MCV: 97.7 fL (ref 78.0–100.0)
Platelets: 185 K/uL (ref 150–400)
RBC: 3.9 MIL/uL — ABNORMAL LOW (ref 4.22–5.81)
RDW: 15 % (ref 11.5–15.5)
WBC: 7.2 K/uL (ref 4.0–10.5)

## 2015-08-11 LAB — OSMOLALITY: OSMOLALITY: 293 mosm/kg (ref 275–295)

## 2015-08-11 LAB — OSMOLALITY, URINE: OSMOLALITY UR: 448 mosm/kg (ref 300–900)

## 2015-08-11 LAB — TSH: TSH: 4.506 u[IU]/mL — AB (ref 0.350–4.500)

## 2015-08-11 LAB — TROPONIN I
TROPONIN I: 0.12 ng/mL — AB (ref <0.031)
TROPONIN I: 0.12 ng/mL — AB (ref <0.031)
Troponin I: 0.12 ng/mL — ABNORMAL HIGH (ref <0.031)

## 2015-08-11 LAB — CREATININE, SERUM
Creatinine, Ser: 1.4 mg/dL — ABNORMAL HIGH (ref 0.61–1.24)
GFR calc Af Amer: 47 mL/min — ABNORMAL LOW
GFR calc non Af Amer: 40 mL/min — ABNORMAL LOW

## 2015-08-11 LAB — BRAIN NATRIURETIC PEPTIDE: B Natriuretic Peptide: 879.7 pg/mL — ABNORMAL HIGH (ref 0.0–100.0)

## 2015-08-11 MED ORDER — AMIODARONE HCL 200 MG PO TABS
200.0000 mg | ORAL_TABLET | Freq: Every day | ORAL | Status: DC
  Administered 2015-08-11 – 2015-08-12 (×2): 200 mg via ORAL
  Filled 2015-08-11 (×2): qty 1

## 2015-08-11 MED ORDER — SODIUM CHLORIDE 0.9% FLUSH
3.0000 mL | Freq: Two times a day (BID) | INTRAVENOUS | Status: DC
  Administered 2015-08-11 – 2015-08-12 (×3): 3 mL via INTRAVENOUS

## 2015-08-11 MED ORDER — ATORVASTATIN CALCIUM 40 MG PO TABS
40.0000 mg | ORAL_TABLET | Freq: Every day | ORAL | Status: DC
  Administered 2015-08-11 – 2015-08-12 (×2): 40 mg via ORAL
  Filled 2015-08-11 (×2): qty 1

## 2015-08-11 MED ORDER — LEVOTHYROXINE SODIUM 100 MCG PO TABS
100.0000 ug | ORAL_TABLET | Freq: Every day | ORAL | Status: DC
  Administered 2015-08-12: 100 ug via ORAL
  Filled 2015-08-11: qty 1

## 2015-08-11 MED ORDER — ASPIRIN EC 81 MG PO TBEC
81.0000 mg | DELAYED_RELEASE_TABLET | Freq: Every day | ORAL | Status: DC
  Administered 2015-08-11 – 2015-08-12 (×2): 81 mg via ORAL
  Filled 2015-08-11 (×2): qty 1

## 2015-08-11 MED ORDER — FUROSEMIDE 10 MG/ML IJ SOLN
20.0000 mg | Freq: Two times a day (BID) | INTRAMUSCULAR | Status: DC
  Administered 2015-08-11 – 2015-08-12 (×3): 20 mg via INTRAVENOUS
  Filled 2015-08-11 (×3): qty 2

## 2015-08-11 MED ORDER — CLOPIDOGREL BISULFATE 75 MG PO TABS
75.0000 mg | ORAL_TABLET | Freq: Every day | ORAL | Status: DC
  Administered 2015-08-11 – 2015-08-12 (×2): 75 mg via ORAL
  Filled 2015-08-11 (×2): qty 1

## 2015-08-11 MED ORDER — POTASSIUM CITRATE ER 10 MEQ (1080 MG) PO TBCR
10.0000 meq | EXTENDED_RELEASE_TABLET | Freq: Every day | ORAL | Status: DC
  Administered 2015-08-12: 10 meq via ORAL
  Filled 2015-08-11 (×2): qty 1

## 2015-08-11 MED ORDER — DEXTROSE 5 % IV SOLN
1.0000 g | INTRAVENOUS | Status: DC
  Administered 2015-08-11: 1 g via INTRAVENOUS
  Filled 2015-08-11: qty 10

## 2015-08-11 MED ORDER — ENOXAPARIN SODIUM 30 MG/0.3ML ~~LOC~~ SOLN
30.0000 mg | SUBCUTANEOUS | Status: DC
  Administered 2015-08-12: 30 mg via SUBCUTANEOUS
  Filled 2015-08-11: qty 0.3

## 2015-08-11 MED ORDER — ENOXAPARIN SODIUM 40 MG/0.4ML ~~LOC~~ SOLN
40.0000 mg | SUBCUTANEOUS | Status: DC
  Administered 2015-08-11: 40 mg via SUBCUTANEOUS
  Filled 2015-08-11: qty 0.4

## 2015-08-11 MED ORDER — FUROSEMIDE 10 MG/ML IJ SOLN
20.0000 mg | Freq: Once | INTRAMUSCULAR | Status: AC
  Administered 2015-08-11: 20 mg via INTRAVENOUS
  Filled 2015-08-11: qty 2

## 2015-08-11 NOTE — Consult Note (Addendum)
   [facility] Inpatient Consult   08/11/2015  SHERIFF AMESQUITA 09-Oct-1914 [id number] Referral received from office from patient's daughter, Clayborne Dana at [phone].  HIPPA verified.  Patient evaluated for community based chronic disease management services with [facility] as a benefit of patient's [employer]. Spoke with patient's wife and daughters at bedside to explain [facility] services for chronic disease and care management after hospital discharge. Consent form signed.  Patient will receive post hospital discharge call and will be evaluated for monthly home visits for assessments and disease process education.  Also, explained the HF EMMI program for symptom follow up and connected to follow up.    Left contact information and [facility] literature at bedside. Made Inpatient Case Manager aware that [facility] following. Of note, [facility] Care Management services does not replace or interfere with any services that are arranged by inpatient case management or social work.  For additional questions or referrals please contact:   Natividad Brood, RN BSN [facility] Liaison  [phone] business mobile phone Toll free office [phone]

## 2015-08-11 NOTE — Progress Notes (Signed)
   Follow Up Note  Pt admitted earlier this morning.  Seen after arrived to floor.  82 year old male admitted 2/20 for mild shortness of breath and found to have mild congestive heart failure. Started on Lasix. Patient already starting to feel better  Exam: CV: Regular rate and rhythm S1-S2 Lungs: Decreased breath sounds bibasilar Abd: Soft, nontender, nondistended, positive bowel sounds Ext: No clubbing or cyanosis, trace edema  Present on Admission:  . Acute on chronic systolic CHF (congestive heart failure) ([place]): Gentle diuresis  . HTN (hypertension) . Arteriosclerotic cardiovascular disease (ASCVD) . CAD (coronary artery disease)  Disposition: Anticipate discharge tomorrow, we'll start ambulation

## 2015-08-11 NOTE — H&P (Signed)
History and Physical  Patient Name: Shaun [name]     [id number]    DOB: [birth year]    DOA: 08/10/2015 Referring physician: Irena Cords, PA-C PCP: Donnie Coffin, MD      Chief Complaint: Dyspnea  HPI: Shaun [name] is a 82 y.o. male with a past medical history significant for CAD, moderate AS, HTN, hypothyroidism who presents with dyspnea and confusion.  The patient's wife called 25 tonight because for the last 2 weeks he has not been sleeping, he has been having trouble breathing when he goes to sleep, and he has been having shortness of breath with any exertion. Then the last 2 days he has had decreased urine output, appeared to be struggling to breath, and was talking about things that aren't there and seeing things that aren't there, which is unusual for him.    In the ED, he was afebrile, but hypoxic on arrival to 86%, tachycardic to 105, tachypneic to 32 and hypotensive to 92/76 mmHg.  Chest x-ray showed pulmonary edema, an ECG showed sinus tachycardia, urinalysis showed positive nitrates and casts, lactate was normal, troponin was elevated.  Na 129, HCO3 21, creatinine 1.38, elevated BUN, WBC 10, Hgb 12.9, BNP 800.  TRH were asked to evaluate for admission.  The patient denies cough, congestion, sputum, fever. He is not able to tell if he has had dysuria.  The patient is ambulatory and has only mild dementia at baseline. He lives with his wife in his own home. He wears a condom catheter because of incontinence. No risk factors for C diff., last hospitalization 2013.  family reported to EDP course of antibiotics within the last month.      Review of Systems:  All other systems negative except as just noted or noted in the history of present illness.  Allergies  Allergen Reactions  . Cocaine Other (See Comments)    Reaction: "passed out" from 1970's septum surgery    Prior to Admission medications   Medication Sig Start Date End Date Taking? Authorizing Provider    amiodarone (PACERONE) 200 MG tablet Take 1 tablet (200 mg total) by mouth daily. 03/06/15  Yes Jettie Booze, MD  aspirin EC 81 MG tablet Take 81 mg by mouth daily.   Yes Historical Provider, MD  atorvastatin (LIPITOR) 40 MG tablet Take 1 tablet (40 mg total) by mouth daily. 05/02/15  Yes Jettie Booze, MD  Cholecalciferol 1000 units capsule Take 2,000 Units by mouth daily.   Yes Historical Provider, MD  clopidogrel (PLAVIX) 75 MG tablet TAKE 1 TABLET DAILY 02/03/15  Yes Jettie Booze, MD  docusate sodium (COLACE) 100 MG capsule Take 200-300 mg by mouth 2 (two) times daily. Takes 2 caps in am and 3 caps in pm   Yes Historical Provider, MD  furosemide (LASIX) 40 MG tablet TAKE 2 TABLETS IN THE MORNING Patient taking differently: Take 60 mg by mouth daily at 12 noon.  07/23/15  Yes Jettie Booze, MD  ipratropium (ATROVENT) 0.03 % nasal spray Place 2 sprays into both nostrils daily as needed for rhinitis.   Yes Historical Provider, MD  isosorbide mononitrate (IMDUR) 60 MG 24 hr tablet TAKE 1 TABLET DAILY 12/05/14  Yes Jettie Booze, MD  Misc Natural Products (NARCOSOFT HERBAL LAX PO) Take 2 tablets by mouth 2 (two) times daily.    Yes Historical Provider, MD  Misc Natural Products (OSTEO BI-FLEX ADV JOINT SHIELD) TABS Take 1 tablet by mouth 2 (two) times daily.  Yes Historical Provider, MD  Multiple Vitamins-Minerals (MULTIVITAMINS THER. W/MINERALS) TABS Take 1 tablet by mouth daily.    Yes Historical Provider, MD  nitroGLYCERIN (NITROSTAT) 0.4 MG SL tablet Place 1 tablet (0.4 mg total) under the tongue every 5 (five) minutes as needed. For chest pain. 04/09/13  Yes Jettie Booze, MD  omeprazole (PRILOSEC) 20 MG capsule Take 1 capsule (20 mg total) by mouth daily. 11/17/11  Yes Jettie Booze, MD  Polyethyl Glycol-Propyl Glycol (SYSTANE) 0.4-0.3 % SOLN Apply 1 drop to eye 2 (two) times daily.   Yes Historical Provider, MD  potassium citrate (UROCIT-K) 10 MEQ (1080  MG) SR tablet Take 10 mEq by mouth daily at 12 noon.  04/04/13  Yes Historical Provider, MD  senna-docusate (SENOKOT-S) 8.6-50 MG tablet Take 1-2 tablets by mouth 2 (two) times daily. Takes 1 tab in am and 2 tabs in pm   Yes Historical Provider, MD  SYNTHROID 100 MCG tablet Take 100 mcg by mouth daily. 11/01/14  Yes Historical Provider, MD    Past Medical History  Diagnosis Date  . Hypertension   . CAD (coronary artery disease)   . Osteoarthritis   . DDD (degenerative disc disease)   . Spinal stenosis   . Heart murmur   . Shortness of breath   . Myocardial infarct ([place])     11/05/11 , 1999  . Anxiety   . Pneumonia     hx of double pneumonia   . GERD (gastroesophageal reflux disease)   . Chronic kidney disease     hxof multiple kidney stones   . Cancer ([place])     hx of skin cancery   . Elevated cholesterol     ELEVATED CHOLESTEROL  . Arthritis     ARTHRITIS  . Kidney stone     KIDNEY STONE ,DR.[place]  . Neuralgia, post-herpetic     POST HERPETIC NEURALGIA    Past Surgical History  Procedure Laterality Date  . Tonsillectomy    . Coronary artery bypass graft      1999  . Lithotripsy    . Hernia repair      bilateral 1947   . Cardiac catheterization    . Nasal septum surgery    . Cystoscopy with ureteroscopy  03/29/2012    Procedure: CYSTOSCOPY WITH URETEROSCOPY;  Surgeon: Molli Hazard, MD;  Location[facility];  Service: Urology;  Laterality: Left;  . Inguinal hernia repair  04/29/2012    Procedure: HERNIA REPAIR INGUINAL INCARCERATED;  Surgeon: Ralene Ok, MD;  Location: [place];  Service: General;  Laterality: Left;  left incarerated inguinal hernia repair with mesh    Family history: family history includes Alzheimer's disease in his brother and brother; Cancer in his father; Coronary artery disease in his father; Heart disease in his brother, brother, and brother; Stroke in his mother.  Social History: Patient lives with his wife. He is from [place]  originally. He builds Nutritional therapist for living. He went to [employer] high school in [place]. He is a former smoker.  He ambulates with a walker.  He is able to participate in ADLs.     Physical Exam: BP 93/59 mmHg  Pulse 89  Temp(Src) 97.3 F (36.3 C) (Oral)  Resp 26  SpO2 95% General appearance: Frail elderly adult male, awake and in no acute distress.   Eyes: Anicteric, conjunctiva pink, lids and lashes normal.  PERRL and EOMI. ENT: No nasal deformity, discharge, or epistaxis.  OP dry.   Lymph: No  cervical or supraclavicular lymphadenopathy. Skin: Warm and dry.  Diffuse ecchymosis.   Cardiac: RRR, nl S1-S2, no murmurs appreciated on my exam.  Capillary refill is brisk.  No JVD, no LE edema.  Radial and DP pulses 2+ and symmetric. Respiratory: Normal respiratory rate and rhythm.  Low pitched expiratory wheezes bilaterally. Abdomen: Abdomen soft without rigidity.  No TTP. No ascites, distension.  Condom catheter in place. MSK: No deformities or effusions. Neuro: Oriented to place, situation, and year.  Talks about the "trucks outside our house last night", and somewhat slowed mentally.  Responding to questions, attention seems mostly normal.  Speech is mildly slurred.  Moves all extremities, appears equal.  Globally weak.  Cranial nerves normal.    Psych: Behavior appropriate.  Affect normal.  No evidence of aural or visual hallucinations or delusions.       Labs on Admission:  The metabolic panel shows hyponatremia, normal potassium, slightly low bicarbonate, elevated creatinine at baseline. Transaminases and bilirubin are normal.  Troponin minimally elevated. Lactate normal. BNP 800. Urinalysis shows Nitrates. . The complete blood count shows no leukocytosis, anemia, thrombocytopenia.   Radiological Exams on Admission: Personally reviewed: Dg Chest 2 View  08/11/2015  CLINICAL DATA:  82 year old male with right chest pain for days. EXAM: CHEST  2 VIEW COMPARISON:  04/30/2012  FINDINGS: Patient is post median sternotomy and CABG. Cardiomediastinal contours are unchanged. Bilateral perihilar and lower lobe alveolar opacities. Probable left pleural effusion. Scarring at the right costophrenic angle. Scarring in the right and left mid lung again seen. No pneumothorax. IMPRESSION: Bilateral perihilar lower lobe alveolar opacities concerning for pulmonary edema. Left pleural effusion. Findings suspicious for CHF. Right basilar and mid lung scarring. Electronically Signed   By: Jeb Levering M.D.   On: 08/11/2015 01:01    EKG: Independently reviewed. sinus rhythm, rate 110, incomplete left bundle branch block.     Assessment/Plan 1. Acute on chronic CHF:  This is new.  Last EF 40% in 2013.  Last AS was moderate.  No loud murmur on my exam, but progressive dyspnea, orthopnea, PND and leg swelling with elevated BNP.   -Cautious diuresis -Strict I/O, daily weights, and net negative goal 1L over 24 hours -Daily BMP -Consult to Cardiology, appreciate cares -Echocardiogram is ordered   2. Hypothyrodism:  This is new.   -Check TSH -Continue home levothyroxine  3. CAD and HTN:  Hypotensive at admission -Hold Imdur and furosemide -Unclear from patient's last Cardiology note why patient is on amiodarone, but this is continued -Continue statin and aspirin and plavix  4. Hyponatremia:  -Check free water clearance, suspect hypervolemic hyponatremia  5. Elevated troponin:  ACS doubted, favor CHF related demand. -Trend TNI  6. UTI: Nitrites on exam, risk factor is condom catheter.  Last UTI was 2015.  There was mention to other EDP of diarrhea, recently, but family did not mention this to me, and patient has no risk factors of hospitalizations recently for C diff, so this is doubted. -Add on urine culture -Ceftriaxone, renally dosed      DVT PPx: Lovenox Diet: Heart healthy Consultants: Cardiology Code Status: DO NOT RESUSCITATE, patient has consulted with  palliative care in 2013 or so, per daughters. Family Communication: Step-Daughters, present at bedside.  CODE STATUS confirmed.  Overnight plan discussed and all questions answered. Medical decision making: What exists of the patient's previous chart was reviewed in depth and the case was discussed with Irena Cords and Cardiology. Patient seen 3:11 AM on 08/11/2015.  Disposition  Plan:  I recommend admission to telemetry.  Clinical condition: stable, but guarded.  Anticipate gentle diuresis, close monitoring of renal function and antibiotics for UTI.      Edwin Dada [facility] Pager [phone]

## 2015-08-11 NOTE — Progress Notes (Signed)
Talked to patient with spouse present about DCP; pt/ spouse is agreealbe to the [facility] CHF program with [facility], referral placed. Mindi Slicker [facility] [phone]

## 2015-08-11 NOTE — Consult Note (Signed)
Reason for Consult: moderate AS Primary Cardiologist: Varanasi/Turner Referring Physician: Dr. Myrene Buddy  ALEXY HELDT is an 82 y.o. male.  HPI: Mr. 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 Per his daughter, he also is acting mildly abnormal with some delirium noted. He's been on antibiotics by his PCP, he had some diarrhea today. He's had labored breathing in the ER and lower oxygen saturations. He was noted to have abnormal chest x-ray concerning for fluid vs. Infection and troponin 0.11. He was admitted to the hospitalist service for further evaluation and management. No known sick contacts.      Past Medical History  Diagnosis Date  . Hypertension   . CAD (coronary artery disease)   . Osteoarthritis   . DDD (degenerative disc disease)   . Spinal stenosis   . Heart murmur   . Shortness of breath   . Myocardial infarct ([place])     11/05/11 , 1999  . Anxiety   . Pneumonia     hx of double pneumonia   . GERD (gastroesophageal reflux disease)   . Chronic kidney disease     hxof multiple kidney stones   . Cancer ([place])     hx of skin cancery   . Elevated cholesterol     ELEVATED CHOLESTEROL  . Arthritis     ARTHRITIS  . Kidney stone     KIDNEY STONE ,[place]  . Neuralgia, post-herpetic     POST HERPETIC NEURALGIA    Past Surgical History  Procedure Laterality Date  . Tonsillectomy    . Coronary artery bypass graft      1999  . Lithotripsy    . Hernia repair      bilateral 1947   . Cardiac catheterization    . Nasal septum surgery    . Cystoscopy with ureteroscopy  03/29/2012    Procedure: CYSTOSCOPY WITH URETEROSCOPY;  Surgeon: Molli Hazard, MD;  Location: [facility];  Service: Urology;  Laterality: Left;  . Inguinal hernia repair  04/29/2012    Procedure: HERNIA REPAIR INGUINAL INCARCERATED;  Surgeon: Ralene Ok, MD;   Location: [facility];  Service: General;  Laterality: Left;  left incarerated inguinal hernia repair with mesh    Family History  Problem Relation Age of Onset  . Cancer Father   . Coronary artery disease Father   . Stroke Mother   . Heart disease Brother   . Heart disease Brother   . Alzheimer's disease Brother   . Alzheimer's disease Brother   . Heart disease Brother     Social History:  reports that he has quit smoking. His smoking use included Pipe. He has never used smokeless tobacco. He reports that he does not drink alcohol or use illicit drugs.  Allergies:  Allergies  Allergen Reactions  . Cocaine Other (See Comments)    Reaction: "passed out" from 1970's septum surgery    Medications: I have reviewed the patient's current medications. Prior to Admission:  (Not in a hospital admission) Scheduled:  Results for orders placed or performed during the hospital encounter of 08/10/15 (from the past 48 hour(s))  Comprehensive metabolic panel     Status: Abnormal   Collection Time: 08/10/15 10:41 PM  Result Value Ref Range   Sodium 129 (L) 135 - 145 mmol/L   Potassium 3.7 3.5 - 5.1 mmol/L   Chloride 95 (L) 101 -  111 mmol/L   CO2 21 (L) 22 - 32 mmol/L   Glucose, Bld 126 (H) 65 - 99 mg/dL   BUN 34 (H) 6 - 20 mg/dL   Creatinine, Ser 1.38 (H) 0.61 - 1.24 mg/dL   Calcium 9.3 8.9 - 10.3 mg/dL   Total Protein 6.4 (L) 6.5 - 8.1 g/dL   Albumin 3.0 (L) 3.5 - 5.0 g/dL   AST 23 15 - 41 U/L   ALT 14 (L) 17 - 63 U/L   Alkaline Phosphatase 71 38 - 126 U/L   Total Bilirubin 0.8 0.3 - 1.2 mg/dL   GFR calc non Af Amer 41 (L) >60 mL/min   GFR calc Af Amer 47 (L) >60 mL/min    Comment: (NOTE) The eGFR has been calculated using the CKD EPI equation. This calculation has not been validated in all clinical situations. eGFR's persistently <60 mL/min signify possible Chronic Kidney Disease.    Anion gap 13 5 - 15  CBC with Differential     Status: Abnormal   Collection Time: 08/10/15 10:41  PM  Result Value Ref Range   WBC 10.0 4.0 - 10.5 K/uL   RBC 4.02 (L) 4.22 - 5.81 MIL/uL   Hemoglobin 12.9 (L) 13.0 - 17.0 g/dL   HCT 39.2 39.0 - 52.0 %   MCV 97.5 78.0 - 100.0 fL   MCH 32.1 26.0 - 34.0 pg   MCHC 32.9 30.0 - 36.0 g/dL   RDW 15.0 11.5 - 15.5 %   Platelets 201 150 - 400 K/uL   Neutrophils Relative % 83 %   Neutro Abs 8.4 (H) 1.7 - 7.7 K/uL   Lymphocytes Relative 9 %   Lymphs Abs 0.9 0.7 - 4.0 K/uL   Monocytes Relative 8 %   Monocytes Absolute 0.8 0.1 - 1.0 K/uL   Eosinophils Relative 0 %   Eosinophils Absolute 0.0 0.0 - 0.7 K/uL   Basophils Relative 0 %   Basophils Absolute 0.0 0.0 - 0.1 K/uL  Troponin I     Status: Abnormal   Collection Time: 08/10/15 10:41 PM  Result Value Ref Range   Troponin I 0.11 (H) <0.031 ng/mL    Comment:        PERSISTENTLY INCREASED TROPONIN VALUES IN THE RANGE OF 0.04-0.49 ng/mL CAN BE SEEN IN:       -UNSTABLE ANGINA       -CONGESTIVE HEART FAILURE       -MYOCARDITIS       -CHEST TRAUMA       -ARRYHTHMIAS       -LATE PRESENTING MYOCARDIAL INFARCTION       -COPD   CLINICAL FOLLOW-UP RECOMMENDED.   Brain natriuretic peptide     Status: Abnormal   Collection Time: 08/10/15 10:41 PM  Result Value Ref Range   B Natriuretic Peptide 879.7 (H) 0.0 - 100.0 pg/mL  Urinalysis, Routine w reflex microscopic     Status: Abnormal   Collection Time: 08/10/15 10:47 PM  Result Value Ref Range   Color, Urine YELLOW YELLOW   APPearance CLEAR CLEAR   Specific Gravity, Urine 1.015 1.005 - 1.030   pH 5.5 5.0 - 8.0   Glucose, UA NEGATIVE NEGATIVE mg/dL   Hgb urine dipstick NEGATIVE NEGATIVE   Bilirubin Urine NEGATIVE NEGATIVE   Ketones, ur NEGATIVE NEGATIVE mg/dL   Protein, ur NEGATIVE NEGATIVE mg/dL   Nitrite POSITIVE (A) NEGATIVE   Leukocytes, UA TRACE (A) NEGATIVE  Urine microscopic-add on     Status: Abnormal   Collection  Time: 08/10/15 10:47 PM  Result Value Ref Range   Squamous Epithelial / LPF NONE SEEN NONE SEEN   WBC, UA 0-5 0 -  5 WBC/hpf   RBC / HPF NONE SEEN 0 - 5 RBC/hpf   Bacteria, UA RARE (A) NONE SEEN   Casts HYALINE CASTS (A) NEGATIVE   Crystals CA OXALATE CRYSTALS (A) NEGATIVE  I-Stat CG4 Lactic Acid, ED     Status: None   Collection Time: 08/10/15 10:54 PM  Result Value Ref Range   Lactic Acid, Venous 1.27 0.5 - 2.0 mmol/L    Dg Chest 2 View  08/11/2015  CLINICAL DATA:  82 year old male with right chest pain for days. EXAM: CHEST  2 VIEW COMPARISON:  04/30/2012 FINDINGS: Patient is post median sternotomy and CABG. Cardiomediastinal contours are unchanged. Bilateral perihilar and lower lobe alveolar opacities. Probable left pleural effusion. Scarring at the right costophrenic angle. Scarring in the right and left mid lung again seen. No pneumothorax. IMPRESSION: Bilateral perihilar lower lobe alveolar opacities concerning for pulmonary edema. Left pleural effusion. Findings suspicious for CHF. Right basilar and mid lung scarring. Electronically Signed   By: Jeb Levering M.D.   On: 08/11/2015 01:01    Review of Systems  Constitutional: Positive for malaise/fatigue. Negative for fever and chills.  HENT: Positive for hearing loss. Negative for ear pain.   Eyes: Negative for double vision and photophobia.  Respiratory: Positive for cough and shortness of breath.   Cardiovascular: Positive for orthopnea and leg swelling. Negative for palpitations.  Gastrointestinal: Positive for diarrhea. Negative for nausea, vomiting and abdominal pain.  Genitourinary: Negative for hematuria and flank pain.  Musculoskeletal: Positive for joint pain. Negative for myalgias and neck pain.  Neurological: Positive for weakness. Negative for tingling, speech change and focal weakness.  Endo/Heme/Allergies: Negative for polydipsia.  Psychiatric/Behavioral: Negative for depression, suicidal ideas and substance abuse.   Blood pressure 93/59, pulse 89, temperature 97.3 F (36.3 C), temperature source Oral, resp. rate 26, SpO2 95  %. Physical Exam  Nursing note and vitals reviewed. Constitutional: He is oriented to person, place, and time. He appears well-developed and well-nourished. He appears distressed.  Appears mildly uncomfortable  HENT:  Head: Normocephalic and atraumatic.  Nose: Nose normal.  Mouth/Throat: No oropharyngeal exudate.  Eyes: Conjunctivae and EOM are normal. Pupils are equal, round, and reactive to light. No scleral icterus.  Neck: Normal range of motion. Neck supple. JVD present. No tracheal deviation present.  JVP 3-4 cm above clavicle  Cardiovascular: Normal rate, regular rhythm and intact distal pulses.  Exam reveals no gallop.   Murmur heard. Mid to late peaking systolic murmur heard at LSB  Respiratory: He has no wheezes. He has rales.  Decreased BS at bases, scattered rales diffusely  GI: Soft. Bowel sounds are normal. He exhibits no distension. There is no tenderness. There is no rebound.  Musculoskeletal: Normal range of motion. He exhibits edema.  Neurological: He is alert and oriented to person, place, and time. No cranial nerve deficit. Coordination normal.  Skin: Skin is warm and dry. No rash noted. He is not diaphoretic. No erythema.  Psychiatric: He has a normal mood and affect. His behavior is normal. Thought content normal.  labs reviewed; bun/cr 34/1.38, albumin 3.0, trop 0.11 EKG: Sinus tachycardia 5/13 Echo: moderate AS, EF 40-45%  Assessment/Plan: Mr. 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 His exam and chest x-ray are concerning for increased volume but his chest x-ray could also be consistent with pneumonia. He has a mild troponin. I suspect he has volume overload exacerbated by his at least moderate aortic stenosis. However, will further evaluate with labs. He has low normal blood pressure. I favor very gentle  trial of diuresis with close monitoring. I also favor broad spectrum antibiotics for HCAP initially after blood/urine cultures.  Problem List 1. Acute dyspnea 2. ? Moderate to Severe Aortic Stenosis 3. ? PNA  4. Acute on chronic systolic heart failure 5. Elevated Troponin - Type II NSTEMI 6. Coronary Artery Disease 7. Intermittent delirium 8. Respiratory failure   Plan 1. Admit to stepdown, trend troponins, trial 20-40 mg IV lasix x1 2. Broad spectrum antibiotics, blood/urine culture 3. Update echocardiogram 4. TSH, BNP, hba1c, magnesium 5. Culture diarrhea and send c. Diff if recurrent 6. CT head for delirium 7. Nasal cannula  Kiyanna Biegler 08/11/2015, 3:50 AM

## 2015-08-12 ENCOUNTER — Other Ambulatory Visit (HOSPITAL_COMMUNITY): Payer: MEDICARE

## 2015-08-12 DIAGNOSIS — I10 Essential (primary) hypertension: Secondary | ICD-10-CM

## 2015-08-12 DIAGNOSIS — J9621 Acute and chronic respiratory failure with hypoxia: Secondary | ICD-10-CM

## 2015-08-12 LAB — BASIC METABOLIC PANEL
Anion gap: 13 (ref 5–15)
BUN: 30 mg/dL — ABNORMAL HIGH (ref 6–20)
CHLORIDE: 103 mmol/L (ref 101–111)
CO2: 25 mmol/L (ref 22–32)
Calcium: 9.7 mg/dL (ref 8.9–10.3)
Creatinine, Ser: 1.19 mg/dL (ref 0.61–1.24)
GFR, EST AFRICAN AMERICAN: 57 mL/min — AB (ref >60)
GFR, EST NON AFRICAN AMERICAN: 49 mL/min — AB (ref >60)
Glucose, Bld: 78 mg/dL (ref 65–99)
POTASSIUM: 3.9 mmol/L (ref 3.5–5.1)
SODIUM: 141 mmol/L (ref 135–145)

## 2015-08-12 MED ORDER — FUROSEMIDE 40 MG PO TABS
60.0000 mg | ORAL_TABLET | Freq: Every day | ORAL | Status: AC
Start: 2015-08-12 — End: ?

## 2015-08-12 NOTE — Progress Notes (Signed)
SATURATION QUALIFICATIONS: (This note is used to comply with regulatory documentation for home oxygen)  Patient Saturations on Room Air at Rest = 92%  Patient Saturations on Room Air while Ambulating = 83%  Patient Saturations on 2 Liters of oxygen while Ambulating = 91%  Please briefly explain why patient needs home oxygen: Hypoxic with ambulation on RA.  [place], [place] 08/12/2015

## 2015-08-12 NOTE — Care Management Note (Signed)
Case Management Note  Patient Details  Name: DAEKWON FOUGHT MRN: [id number] Date of Birth: 1914/11/29  Subjective/Objective:            Admitted with CHF        Action/Plan: Patient lives at home with his spouse and 24 hr caregivers. CM talked to patient with his daughter present in room about [facility] choices, his daughter requested that I talk to another daughter [name] about [facility] choices, she chose [facility] for HHPT and home oxygen. A portable oxygen tank is to be delivered to the room today prior to discharging home, also Brenton Grills is to contact his daughter concerning delivering smaller oxygen tanks to the home.Eval for portable oxygen placed as requested by [facility]. Tiffany with [facility] called for [facility] arrangements.  Expected Discharge Date:  08/13/15               Expected Discharge Plan:  [place]  Discharge planning Services  CM Consult  Choice offered to:  Adult Children  DME Arranged:  Oxygen DME Agency:  [place] Arranged:  PT [place] Agency:  [place]  Status of Service:  In process, will continue to follow  Sherrilyn Rist [id number] 08/12/2015, 3:26 PM

## 2015-08-12 NOTE — Evaluation (Addendum)
Physical Therapy Evaluation Patient Details Name: Shaun [name] MRN: [id number] DOB: Feb 24, 1915 Today's Date: 08/12/2015   History of Present Illness  Shaun [name] is a 82 y.o. male with a past medical history significant for CAD, moderate AS, HTN, hypothyroidism who presents with dyspnea and confusion.  Clinical Impression  Patient presents with decreased independence with mobility due to deficits listed in PT problem list.  He will benefit from skilled PT in the acute setting to allow return home with family/caregiver support and HHPT.  Has needed equipment for home.  Of note did desat ambulating on room air.  See separate note for documentation.    Follow Up Recommendations Home health PT;Supervision/Assistance - 24 hour    Equipment Recommendations  None recommended by PT    Recommendations for Other Services       Precautions / Restrictions Precautions Precautions: Fall Precaution Comments: reports recent "fall at home" but caregiver assisted to prevent per daughter      Mobility  Bed Mobility Overal bed mobility: Needs Assistance Bed Mobility: Supine to Sit     Supine to sit: Supervision;HOB elevated     General bed mobility comments: increased time, HOB elevated use of rails  Transfers Overall transfer level: Needs assistance Equipment used: Rolling walker (2 wheeled) Transfers: Sit to/from Stand Sit to Stand: Min assist         General transfer comment: assist for balance due to posterior and rt lateral lean  Ambulation/Gait Ambulation/Gait assistance: Min assist;Mod assist Ambulation Distance (Feet): 90 Feet Assistive device: Rolling walker (2 wheeled) Gait Pattern/deviations: Step-through pattern;Decreased stride length;Trunk flexed;Shuffle     General Gait Details: 0 foot clearance, demonstrated R lateral lean at times, assist for balance, safety and proximity to walker  Stairs            Wheelchair Mobility    Modified Rankin  (Stroke Patients Only)       Balance Overall balance assessment: Needs assistance Sitting-balance support: Feet supported Sitting balance-Leahy Scale: Fair Sitting balance - Comments: can sit safely static, but cues for preventing posterior LOB with dynamic siting activities (i.e MMT of LE's)   Standing balance support: Bilateral upper extremity supported Standing balance-Leahy Scale: Poor Standing balance comment: needs UE support and physical help for balance                             Pertinent Vitals/Pain Pain Assessment: No/denies pain    Home Living Family/patient expects to be discharged to:: Private residence Living Arrangements: Spouse/significant other Available Help at Discharge: Personal care attendant;Family;Available 24 hours/day Type of Home: House Home Access: Ramped entrance     Home Layout: One level Home Equipment: Walker - 2 wheels;Wheelchair - manual;Shower seat - built in;Grab bars - toilet;Grab bars - tub/shower;Hand held shower head      Prior Function Level of Independence: Needs assistance   Gait / Transfers Assistance Needed: walks with walker and caregiver supervision  ADL's / Homemaking Assistance Needed: assist for ADL's        Hand Dominance        Extremity/Trunk Assessment               Lower Extremity Assessment: Generalized weakness      Cervical / Trunk Assessment: Kyphotic  Communication   Communication: HOH  Cognition Arousal/Alertness: Awake/alert   Overall Cognitive Status: History of cognitive impairments - at baseline  General Comments General comments (skin integrity, edema, etc.): daughter report L knee gives away at times and can cause falls. has metal upright  AFO that helps some, but heavy so wears about 70% of the time    Exercises        Assessment/Plan    PT Assessment Patient needs continued PT services  PT Diagnosis Abnormality of gait;Generalized  weakness   PT Problem List Decreased strength;Decreased knowledge of use of DME;Decreased activity tolerance;Decreased safety awareness;Cardiopulmonary status limiting activity;Decreased knowledge of precautions  PT Treatment Interventions DME instruction;Gait training;Functional mobility training;Therapeutic activities;Therapeutic exercise;Patient/family education;Balance training   PT Goals (Current goals can be found in the Care Plan section) Acute Rehab PT Goals Patient Stated Goal: to go home PT Goal Formulation: With patient/family Time For Goal Achievement: 08/19/15 Potential to Achieve Goals: Good    Frequency Min 3X/week   Barriers to discharge        Co-evaluation               End of Session Equipment Utilized During Treatment: Gait belt Activity Tolerance: Patient limited by fatigue Patient left: with call bell/phone within reach;in bed;with family/visitor present Nurse Communication: Other (comment) (O2 status)         Time: 1140-1210 PT Time Calculation (min) (ACUTE ONLY): 30 min   Charges:    Mod PT Eval PT Treatments $Gait Training: 8-22 mins   PT G Codes:        Reginia Naas 2015-09-01, 12:30 PM  Magda Kiel, [place] 2015/09/01

## 2015-08-12 NOTE — Discharge Summary (Signed)
Discharge Summary  Shaun [name] [id number] DOB: Jun 10, 1915  PCP: Shaun Coffin, MD  Admit date: 08/10/2015 Discharge date: 08/12/2015  Time spent: 25 minutes   Recommendations for Outpatient Follow-up:  1. Patient went home on oxygen 2 L nasal cannula continuous 2. He will follow-up with his PCP in the next one month  3. Patient will go home with home health physical therapy  Discharge Diagnoses:  Active Hospital Problems   Diagnosis Date Noted  . Acute on chronic systolic CHF (congestive heart failure) ([place]) 08/11/2015  . UTI (lower urinary tract infection) 08/11/2015  . CAD (coronary artery disease) 04/29/2012  . HTN (hypertension) 05/30/2011  . Arteriosclerotic cardiovascular disease (ASCVD) 05/30/2011    Resolved Hospital Problems   Diagnosis Date Noted Date Resolved  No resolved problems to display.    Discharge Condition: Improved, being discharged home   Diet recommendation: Heart healthy   Filed Weights   08/11/15 0421 08/12/15 0425  Weight: 62.097 kg (136 lb 14.4 oz) 60.601 kg (133 lb 9.6 oz)    History of present illness:  Patient is a 82 year old male with past medical history of chronic systolic heart failure and hypertension and CAD admitted on 2/24 progressively worsening shortness of breath and found to be in acute respiratory failure with hypoxia.   Hospital Course:  Principal Problem:   Acute on chronic respiratory failure with hypoxia secondary to Acute on chronic systolic CHF (congestive heart failure) [facility]): see my cardiology. They recommended no aggressive intervention, only gentle diuresis. Patient responded well to this. By following day, breathing more comfortably. Noted to have oxygen saturations of 92% on room air at rest, however with ambulation on room air, oxygen saturations down to 83% and with oxygen 2 L, back up to 91%. Patient felt to need oxygen. Had a discussion with his daughter and given only mild diuresis, suspected likely he  has been in some degree of respiratory failure for some time and will need this oxygen long-term. Seen by physical therapy also recommended home health physical therapy Active Problems:   HTN (hypertension): Blood pressure stable during hospitalization   Arteriosclerotic cardiovascular disease (ASCVD)   CAD (coronary artery disease)    Procedures:  None   Consultations:  Cardiology   Discharge Exam: BP 92/63 mmHg  Pulse 84  Temp(Src) 98 F (36.7 C) (Oral)  Resp 18  Wt 60.601 kg (133 lb 9.6 oz)  SpO2 97%  General: Alert and oriented 2, no acute distress  Cardiovascular: Regular rate and rhythm, [id number], 2/6 systolic ejection murmur  Respiratory: Clear to auscultation bilaterally   Discharge Instructions You were cared for by a hospitalist during your hospital stay. If you have any questions about your discharge medications or the care you received while you were in the hospital after you are discharged, you can call the unit and asked to speak with the hospitalist on call if the hospitalist that took care of you is not available. Once you are discharged, your primary care physician will handle any further medical issues. Please note that NO REFILLS for any discharge medications will be authorized once you are discharged, as it is imperative that you return to your primary care physician (or establish a relationship with a primary care physician if you do not have one) for your aftercare needs so that they can reassess your need for medications and monitor your lab values.  Discharge Instructions    Diet - low sodium heart healthy    Complete by:  As directed  Increase activity slowly    Complete by:  As directed             Medication List    TAKE these medications        amiodarone 200 MG tablet  Commonly known as:  PACERONE  Take 1 tablet (200 mg total) by mouth daily.     aspirin EC 81 MG tablet  Take 81 mg by mouth daily.     atorvastatin 40 MG tablet    Commonly known as:  LIPITOR  Take 1 tablet (40 mg total) by mouth daily.     Cholecalciferol 1000 units capsule  Take 2,000 Units by mouth daily.     clopidogrel 75 MG tablet  Commonly known as:  PLAVIX  TAKE 1 TABLET DAILY     docusate sodium 100 MG capsule  Commonly known as:  COLACE  Take 200-300 mg by mouth 2 (two) times daily. Takes 2 caps in am and 3 caps in pm     furosemide 40 MG tablet  Commonly known as:  LASIX  Take 1.5 tablets (60 mg total) by mouth daily at 12 noon.     ipratropium 0.03 % nasal spray  Commonly known as:  ATROVENT  Place 2 sprays into both nostrils daily as needed for rhinitis.     isosorbide mononitrate 60 MG 24 hr tablet  Commonly known as:  IMDUR  TAKE 1 TABLET DAILY     multivitamins ther. w/minerals Tabs tablet  Take 1 tablet by mouth daily.     nitroGLYCERIN 0.4 MG SL tablet  Commonly known as:  NITROSTAT  Place 1 tablet (0.4 mg total) under the tongue every 5 (five) minutes as needed. For chest pain.     omeprazole 20 MG capsule  Commonly known as:  PRILOSEC  Take 1 capsule (20 mg total) by mouth daily.     NARCOSOFT HERBAL LAX PO  Take 2 tablets by mouth 2 (two) times daily.     OSTEO BI-FLEX ADV JOINT SHIELD Tabs  Take 1 tablet by mouth 2 (two) times daily.     potassium citrate 10 MEQ (1080 MG) SR tablet  Commonly known as:  UROCIT-K  Take 10 mEq by mouth daily at 12 noon.     senna-docusate 8.6-50 MG tablet  Commonly known as:  Senokot-S  Take 1-2 tablets by mouth 2 (two) times daily. Takes 1 tab in am and 2 tabs in pm     SYNTHROID 100 MCG tablet  Generic drug:  levothyroxine  Take 100 mcg by mouth daily.     SYSTANE 0.4-0.3 % Soln  Generic drug:  Polyethyl Glycol-Propyl Glycol  Apply 1 drop to eye 2 (two) times daily.       Allergies  Allergen Reactions  . Cocaine Other (See Comments)    Reaction: "passed out" from 1970's septum surgery       Follow-up Information    Follow up with Shaun Coffin, MD In 1  month.   Specialty:  Family Medicine   Why:  Office will call office   Contact information:   [address] [place] [address] [phone]       Follow up with [facility].   Why:  They will do your home health care at your home and deliver your oxygen to your home   Contact information:   [address] [place] [address] [phone]        The results of significant  diagnostics from this hospitalization (including imaging, microbiology, ancillary and laboratory) are listed below for reference.    Significant Diagnostic Studies: Dg Chest 2 View  08/11/2015  CLINICAL DATA:  82 year old male with right chest pain for days. EXAM: CHEST  2 VIEW COMPARISON:  04/30/2012 FINDINGS: Patient is post median sternotomy and CABG. Cardiomediastinal contours are unchanged. Bilateral perihilar and lower lobe alveolar opacities. Probable left pleural effusion. Scarring at the right costophrenic angle. Scarring in the right and left mid lung again seen. No pneumothorax. IMPRESSION: Bilateral perihilar lower lobe alveolar opacities concerning for pulmonary edema. Left pleural effusion. Findings suspicious for CHF. Right basilar and mid lung scarring. Electronically Signed   By: Jeb Levering M.D.   On: 08/11/2015 01:01   Ct Head Wo Contrast  08/11/2015  CLINICAL DATA:  Acute onset of shortness of breath and decreased O2 saturation. Labored breathing. Altered mental status. Initial encounter. EXAM: CT HEAD WITHOUT CONTRAST TECHNIQUE: Contiguous axial images were obtained from the base of the skull through the vertex without intravenous contrast. COMPARISON:  CT of the neck performed 11/14/2008 FINDINGS: There is no evidence of acute infarction, mass lesion, or intra- or extra-axial hemorrhage on CT. Prominence of the ventricles and sulci reflects moderate cortical volume loss. Mild cerebellar atrophy is noted. Scattered periventricular and subcortical white  matter change likely reflects small vessel ischemic microangiopathy. Chronic ischemic change is noted at the basal ganglia bilaterally. A small chronic lacunar infarct is noted at the left cerebellar hemisphere. The brainstem and fourth ventricle are within normal limits. The cerebral hemispheres demonstrate grossly normal gray-white differentiation. No mass effect or midline shift is seen. There is no evidence of fracture; visualized osseous structures are unremarkable in appearance. The visualized portions of the orbits are within normal limits. The paranasal sinuses and mastoid air cells are well-aerated. No significant soft tissue abnormalities are seen. IMPRESSION: 1. No acute intracranial pathology seen on CT. 2. Moderate cortical volume loss and scattered small vessel ischemic microangiopathy. 3. Chronic ischemic change at the basal ganglia bilaterally. Small chronic lacunar infarct at the left cerebellar hemisphere. Electronically Signed   By: Garald Balding M.D.   On: 08/11/2015 04:00    Microbiology: Recent Results (from the past 240 hour(s))  Culture, Urine     Status: None (Preliminary result)   Collection Time: 08/10/15 10:47 PM  Result Value Ref Range Status   Specimen Description URINE, RANDOM  Final   Special Requests ADDED 0616 08/11/15  Final   Culture >=100,000 COLONIES/mL GRAM NEGATIVE RODS  Final   Report Status PENDING  Incomplete     Labs: Basic Metabolic Panel:  Recent Labs Lab 08/10/15 2241 08/11/15 0507 08/12/15 0400  NA 129*  --  141  K 3.7  --  3.9  CL 95*  --  103  CO2 21*  --  25  GLUCOSE 126*  --  78  BUN 34*  --  30*  CREATININE 1.38* 1.40* 1.19  CALCIUM 9.3  --  9.7   Liver Function Tests:  Recent Labs Lab 08/10/15 2241  AST 23  ALT 14*  ALKPHOS 71  BILITOT 0.8  PROT 6.4*  ALBUMIN 3.0*   No results for input(s): LIPASE, AMYLASE in the last 168 hours. No results for input(s): AMMONIA in the last 168 hours. CBC:  Recent Labs Lab  08/10/15 2241 08/11/15 0507  WBC 10.0 7.2  NEUTROABS 8.4*  --   HGB 12.9* 12.2*  HCT 39.2 38.1*  MCV 97.5 97.7  PLT 201 185  Cardiac Enzymes:  Recent Labs Lab 08/10/15 2241 08/11/15 0507 08/11/15 1101 08/11/15 1626  TROPONINI 0.11* 0.12* 0.12* 0.12*   BNP: BNP (last 3 results)  Recent Labs  08/10/15 2241  BNP 879.7*    ProBNP (last 3 results) No results for input(s): PROBNP in the last 8760 hours.  CBG: No results for input(s): GLUCAP in the last 168 hours.     Signed:  Annita Brod  [facility] 08/12/2015, 5:20 PM

## 2015-08-12 NOTE — Progress Notes (Signed)
Patient stable this morning. Agree with no further cardiac workup.  Daryel November, MD

## 2015-08-13 ENCOUNTER — Other Ambulatory Visit: Payer: Self-pay

## 2015-08-14 ENCOUNTER — Other Ambulatory Visit: Payer: Self-pay

## 2015-08-14 LAB — URINE CULTURE

## 2015-08-14 NOTE — Patient Outreach (Signed)
Initial telephone contact for assessment of community care coordination needs. Spoke with patient's daughter, Windell Hummingbird, [place] director.  Mrs. Arnoldo Morale provided patient's date of birth and address and is listed as patient's contact. Mrs. Arnoldo Morale was able to provide information for assessment.  Home visit scheduled for next week for further assessment of community care assessment and caregiver education. Initial case management care plan created.

## 2015-08-18 ENCOUNTER — Other Ambulatory Visit: Payer: Self-pay

## 2015-08-18 NOTE — Patient Outreach (Signed)
Communication with patient's daughter, Shaun [name]. Shaun [name] informed this RNCM on issue with oxygen.  Call made to [place], spoke with [facility] who stated she put in a work order to get a [employer]chant navy officer out there today.    Communicated with patient's daughter to inform her of intervention.    Plan:  home visit this week

## 2015-08-21 ENCOUNTER — Other Ambulatory Visit: Payer: Self-pay

## 2015-08-22 NOTE — Patient Outreach (Signed)
[facility]) Care Management  August 21, 2015  Shaun [name] 16-Feb-1915 [id number]   Initial home visit for assessment of community care coordination needs. Patient and wife have private pay 24 hour in home aide care.  Patient and wife were very pleasant and readily responded to assessment quesitons. Patient weighs daily and and presented recordings of weights since discharge from acute care.   Patient and wife opted not to view Heart Failure EMMI material, requested copies be sent instead.   Patient's care plan reviewed and updated.    Plan: Telephone contact next week for further assessment of community care coordination needs.

## 2015-08-26 ENCOUNTER — Other Ambulatory Visit: Payer: Self-pay

## 2015-08-27 NOTE — Patient Outreach (Signed)
Per patient and wife request, EMMI video educational material printed to be delivered to patient.  Plan: Telephone call  Next week to follow up with heart failure education needs.

## 2015-09-02 ENCOUNTER — Other Ambulatory Visit: Payer: Self-pay

## 2015-09-02 NOTE — Patient Outreach (Signed)
This RNCM made home visit to deliver printed EMMI Heart Failure Educational Bundle. Patient had requested printed information so he could read at his leisure. Patient states compliance with daily weights and low sodium diet.  Patient encouraged to call if he has questions after reading material.  Plan: Telephone contact next week for community care coordination

## 2015-09-07 ENCOUNTER — Encounter: Payer: Self-pay | Admitting: Interventional Cardiology

## 2015-09-18 ENCOUNTER — Encounter: Payer: Self-pay | Admitting: Interventional Cardiology

## 2015-09-25 ENCOUNTER — Encounter: Payer: Self-pay | Admitting: Interventional Cardiology

## 2015-09-28 ENCOUNTER — Encounter: Payer: Self-pay | Admitting: Interventional Cardiology

## 2015-09-29 ENCOUNTER — Encounter: Payer: Self-pay | Admitting: Interventional Cardiology

## 2015-10-15 ENCOUNTER — Other Ambulatory Visit (INDEPENDENT_AMBULATORY_CARE_PROVIDER_SITE_OTHER): Payer: MEDICARE

## 2015-10-15 ENCOUNTER — Telehealth: Payer: Self-pay | Admitting: Interventional Cardiology

## 2015-10-15 ENCOUNTER — Inpatient Hospital Stay (HOSPITAL_COMMUNITY)
Admission: AD | Admit: 2015-10-15 | Discharge: 2015-10-19 | DRG: 291 | Disposition: A | Discharge status: Hospice | Payer: MEDICARE | Source: Ambulatory Visit | Attending: Internal Medicine | Admitting: Internal Medicine

## 2015-10-15 DIAGNOSIS — I130 Hypertensive heart and chronic kidney disease with heart failure and stage 1 through stage 4 chronic kidney disease, or unspecified chronic kidney disease: Secondary | ICD-10-CM | POA: Diagnosis present

## 2015-10-15 DIAGNOSIS — I5021 Acute systolic (congestive) heart failure: Secondary | ICD-10-CM

## 2015-10-15 DIAGNOSIS — Z515 Encounter for palliative care: Secondary | ICD-10-CM | POA: Diagnosis present

## 2015-10-15 DIAGNOSIS — Z7189 Other specified counseling: Secondary | ICD-10-CM | POA: Diagnosis not present

## 2015-10-15 DIAGNOSIS — R0600 Dyspnea, unspecified: Secondary | ICD-10-CM | POA: Diagnosis not present

## 2015-10-15 DIAGNOSIS — I10 Essential (primary) hypertension: Secondary | ICD-10-CM | POA: Diagnosis not present

## 2015-10-15 DIAGNOSIS — I2510 Atherosclerotic heart disease of native coronary artery without angina pectoris: Secondary | ICD-10-CM | POA: Diagnosis present

## 2015-10-15 DIAGNOSIS — E785 Hyperlipidemia, unspecified: Secondary | ICD-10-CM | POA: Diagnosis present

## 2015-10-15 DIAGNOSIS — Z809 Family history of malignant neoplasm, unspecified: Secondary | ICD-10-CM

## 2015-10-15 DIAGNOSIS — Z7982 Long term (current) use of aspirin: Secondary | ICD-10-CM

## 2015-10-15 DIAGNOSIS — R0602 Shortness of breath: Secondary | ICD-10-CM

## 2015-10-15 DIAGNOSIS — I509 Heart failure, unspecified: Secondary | ICD-10-CM | POA: Diagnosis not present

## 2015-10-15 DIAGNOSIS — Z87891 Personal history of nicotine dependence: Secondary | ICD-10-CM

## 2015-10-15 DIAGNOSIS — Z7902 Long term (current) use of antithrombotics/antiplatelets: Secondary | ICD-10-CM | POA: Diagnosis not present

## 2015-10-15 DIAGNOSIS — I5022 Chronic systolic (congestive) heart failure: Secondary | ICD-10-CM | POA: Diagnosis not present

## 2015-10-15 DIAGNOSIS — I252 Old myocardial infarction: Secondary | ICD-10-CM | POA: Diagnosis not present

## 2015-10-15 DIAGNOSIS — I5023 Acute on chronic systolic (congestive) heart failure: Secondary | ICD-10-CM | POA: Diagnosis present

## 2015-10-15 DIAGNOSIS — Z820 Family history of epilepsy and other diseases of the nervous system: Secondary | ICD-10-CM | POA: Diagnosis not present

## 2015-10-15 DIAGNOSIS — Z79899 Other long term (current) drug therapy: Secondary | ICD-10-CM

## 2015-10-15 DIAGNOSIS — E7800 Pure hypercholesterolemia, unspecified: Secondary | ICD-10-CM | POA: Diagnosis present

## 2015-10-15 DIAGNOSIS — N183 Chronic kidney disease, stage 3 (moderate): Secondary | ICD-10-CM | POA: Diagnosis not present

## 2015-10-15 DIAGNOSIS — F419 Anxiety disorder, unspecified: Secondary | ICD-10-CM | POA: Diagnosis present

## 2015-10-15 DIAGNOSIS — K219 Gastro-esophageal reflux disease without esophagitis: Secondary | ICD-10-CM | POA: Diagnosis present

## 2015-10-15 DIAGNOSIS — Z823 Family history of stroke: Secondary | ICD-10-CM | POA: Diagnosis not present

## 2015-10-15 DIAGNOSIS — Z66 Do not resuscitate: Secondary | ICD-10-CM | POA: Diagnosis present

## 2015-10-15 DIAGNOSIS — Z951 Presence of aortocoronary bypass graft: Secondary | ICD-10-CM

## 2015-10-15 DIAGNOSIS — I350 Nonrheumatic aortic (valve) stenosis: Secondary | ICD-10-CM | POA: Diagnosis not present

## 2015-10-15 DIAGNOSIS — Z8249 Family history of ischemic heart disease and other diseases of the circulatory system: Secondary | ICD-10-CM

## 2015-10-15 DIAGNOSIS — Z87442 Personal history of urinary calculi: Secondary | ICD-10-CM

## 2015-10-15 LAB — COMPREHENSIVE METABOLIC PANEL
ALBUMIN: 3 g/dL — AB (ref 3.5–5.0)
ALK PHOS: 77 U/L (ref 38–126)
ALT: 15 U/L — AB (ref 17–63)
ALT: 14 U/L — ABNORMAL LOW (ref 17–63)
ANION GAP: 10 (ref 5–15)
AST: 26 U/L (ref 15–41)
AST: 25 U/L (ref 15–41)
Albumin: 3.1 g/dL — ABNORMAL LOW (ref 3.5–5.0)
Alkaline Phosphatase: 72 U/L (ref 38–126)
Anion gap: 10 (ref 5–15)
BILIRUBIN TOTAL: 0.4 mg/dL (ref 0.3–1.2)
BILIRUBIN TOTAL: 0.6 mg/dL (ref 0.3–1.2)
BUN: 34 mg/dL — AB (ref 6–20)
BUN: 32 mg/dL — ABNORMAL HIGH (ref 6–20)
CALCIUM: 9.8 mg/dL (ref 8.9–10.3)
CALCIUM: 9.8 mg/dL (ref 8.9–10.3)
CO2: 30 mmol/L (ref 22–32)
CO2: 29 mmol/L (ref 22–32)
CREATININE: 1.44 mg/dL — AB (ref 0.61–1.24)
Chloride: 95 mmol/L — ABNORMAL LOW (ref 101–111)
Chloride: 98 mmol/L — ABNORMAL LOW (ref 101–111)
Creatinine, Ser: 1.34 mg/dL — ABNORMAL HIGH (ref 0.61–1.24)
GFR calc Af Amer: 45 mL/min — ABNORMAL LOW (ref >60)
GFR, EST AFRICAN AMERICAN: 49 mL/min — AB (ref >60)
GFR, EST NON AFRICAN AMERICAN: 39 mL/min — AB (ref >60)
GFR, EST NON AFRICAN AMERICAN: 42 mL/min — AB (ref >60)
GLUCOSE: 106 mg/dL — AB (ref 65–99)
Glucose, Bld: 101 mg/dL — ABNORMAL HIGH (ref 65–99)
POTASSIUM: 4.7 mmol/L (ref 3.5–5.1)
POTASSIUM: 4.3 mmol/L (ref 3.5–5.1)
Sodium: 135 mmol/L (ref 135–145)
Sodium: 137 mmol/L (ref 135–145)
TOTAL PROTEIN: 6.3 g/dL — AB (ref 6.5–8.1)
TOTAL PROTEIN: 6.7 g/dL (ref 6.5–8.1)

## 2015-10-15 LAB — CBC WITH DIFFERENTIAL/PLATELET
BASOS PCT: 0 %
Basophils Absolute: 0 10*3/uL (ref 0.0–0.1)
Basophils Absolute: 0 10*3/uL (ref 0.0–0.1)
Basophils Relative: 0 %
EOS ABS: 0.1 10*3/uL (ref 0.0–0.7)
Eosinophils Absolute: 0.1 10*3/uL (ref 0.0–0.7)
Eosinophils Relative: 1 %
Eosinophils Relative: 2 %
HCT: 37.2 % — ABNORMAL LOW (ref 39.0–52.0)
HEMATOCRIT: 37.4 % — AB (ref 39.0–52.0)
HEMOGLOBIN: 11.5 g/dL — AB (ref 13.0–17.0)
Hemoglobin: 11.6 g/dL — ABNORMAL LOW (ref 13.0–17.0)
LYMPHS ABS: 0.9 10*3/uL (ref 0.7–4.0)
Lymphocytes Relative: 10 %
Lymphocytes Relative: 13 %
Lymphs Abs: 0.9 10*3/uL (ref 0.7–4.0)
MCH: 30.7 pg (ref 26.0–34.0)
MCH: 30.5 pg (ref 26.0–34.0)
MCHC: 30.9 g/dL (ref 30.0–36.0)
MCHC: 31 g/dL (ref 30.0–36.0)
MCV: 99.2 fL (ref 78.0–100.0)
MCV: 98.4 fL (ref 78.0–100.0)
MONO ABS: 0.9 10*3/uL (ref 0.1–1.0)
MONO ABS: 0.8 10*3/uL (ref 0.1–1.0)
MONOS PCT: 12 %
Monocytes Relative: 10 %
NEUTROS ABS: 5.1 10*3/uL (ref 1.7–7.7)
Neutro Abs: 7 10*3/uL (ref 1.7–7.7)
Neutrophils Relative %: 79 %
Neutrophils Relative %: 73 %
Platelets: 183 10*3/uL (ref 150–400)
Platelets: 177 10*3/uL (ref 150–400)
RBC: 3.75 MIL/uL — ABNORMAL LOW (ref 4.22–5.81)
RBC: 3.8 MIL/uL — ABNORMAL LOW (ref 4.22–5.81)
RDW: 15.4 % (ref 11.5–15.5)
RDW: 15.3 % (ref 11.5–15.5)
WBC: 8.9 10*3/uL (ref 4.0–10.5)
WBC: 7 10*3/uL (ref 4.0–10.5)

## 2015-10-15 LAB — BRAIN NATRIURETIC PEPTIDE
B NATRIURETIC PEPTIDE 5: 1142.3 pg/mL — AB (ref 0.0–100.0)
B NATRIURETIC PEPTIDE 5: 1076.1 pg/mL — AB (ref 0.0–100.0)

## 2015-10-15 LAB — MAGNESIUM: MAGNESIUM: 2.5 mg/dL — AB (ref 1.7–2.4)

## 2015-10-15 LAB — PROTIME-INR
INR: 1.22 (ref 0.00–1.49)
PROTHROMBIN TIME: 15.5 s — AB (ref 11.6–15.2)

## 2015-10-15 LAB — TSH: TSH: 5.831 u[IU]/mL — ABNORMAL HIGH (ref 0.350–4.500)

## 2015-10-15 MED ORDER — SODIUM CHLORIDE 0.9 % IV SOLN: 250.0000 mL | INTRAVENOUS | Status: DC | PRN

## 2015-10-15 MED ORDER — ACETAMINOPHEN 325 MG PO TABS: 650.0000 mg | ORAL_TABLET | ORAL | Status: DC | PRN

## 2015-10-15 MED ORDER — SODIUM CHLORIDE 0.9% FLUSH: 3.0000 mL | INTRAVENOUS | Status: DC | PRN

## 2015-10-15 MED ORDER — VITAMIN D 1000 UNITS PO TABS
2000.0000 [IU] | ORAL_TABLET | Freq: Every day | ORAL | Status: DC
  Administered 2015-10-16 – 2015-10-19 (×4): 2000 [IU] via ORAL
  Filled 2015-10-15 (×4): qty 2

## 2015-10-15 MED ORDER — ATORVASTATIN CALCIUM 40 MG PO TABS
40.0000 mg | ORAL_TABLET | Freq: Every day | ORAL | Status: DC
  Administered 2015-10-16 – 2015-10-19 (×4): 40 mg via ORAL
  Filled 2015-10-15 (×4): qty 1

## 2015-10-15 MED ORDER — SENNOSIDES-DOCUSATE SODIUM 8.6-50 MG PO TABS
2.0000 | ORAL_TABLET | Freq: Every day | ORAL | Status: DC
Start: 2015-10-15 — End: 2015-10-19
  Administered 2015-10-15 – 2015-10-18 (×4): 2 via ORAL
  Filled 2015-10-15 (×4): qty 2

## 2015-10-15 MED ORDER — HEPARIN SODIUM (PORCINE) 5000 UNIT/ML IJ SOLN
5000.0000 [IU] | Freq: Three times a day (TID) | INTRAMUSCULAR | Status: DC
  Administered 2015-10-15 – 2015-10-19 (×11): 5000 [IU] via SUBCUTANEOUS
  Filled 2015-10-15 (×11): qty 1

## 2015-10-15 MED ORDER — SENNOSIDES-DOCUSATE SODIUM 8.6-50 MG PO TABS
1.0000 | ORAL_TABLET | Freq: Every day | ORAL | Status: DC
  Administered 2015-10-16 – 2015-10-19 (×4): 1 via ORAL
  Filled 2015-10-15 (×4): qty 1

## 2015-10-15 MED ORDER — DOCUSATE SODIUM 100 MG PO CAPS
300.0000 mg | ORAL_CAPSULE | Freq: Every day | ORAL | Status: DC
  Administered 2015-10-15 – 2015-10-18 (×4): 300 mg via ORAL
  Filled 2015-10-15 (×4): qty 3

## 2015-10-15 MED ORDER — CLOPIDOGREL BISULFATE 75 MG PO TABS
75.0000 mg | ORAL_TABLET | Freq: Every day | ORAL | Status: DC
  Administered 2015-10-16 – 2015-10-19 (×4): 75 mg via ORAL
  Filled 2015-10-15 (×4): qty 1

## 2015-10-15 MED ORDER — SODIUM CHLORIDE 0.9% FLUSH
3.0000 mL | Freq: Two times a day (BID) | INTRAVENOUS | Status: DC
  Administered 2015-10-15 – 2015-10-19 (×8): 3 mL via INTRAVENOUS

## 2015-10-15 MED ORDER — ADULT MULTIVITAMIN W/MINERALS CH
1.0000 | ORAL_TABLET | Freq: Every day | ORAL | Status: DC
  Administered 2015-10-16 – 2015-10-19 (×4): 1 via ORAL
  Filled 2015-10-15 (×4): qty 1

## 2015-10-15 MED ORDER — ONDANSETRON HCL 4 MG/2ML IJ SOLN: 4.0000 mg | Freq: Four times a day (QID) | INTRAMUSCULAR | Status: DC | PRN

## 2015-10-15 MED ORDER — ISOSORBIDE MONONITRATE ER 60 MG PO TB24
60.0000 mg | ORAL_TABLET | Freq: Every day | ORAL | Status: DC
  Administered 2015-10-16 – 2015-10-18 (×3): 60 mg via ORAL
  Filled 2015-10-15 (×3): qty 1

## 2015-10-15 MED ORDER — ASPIRIN EC 81 MG PO TBEC
81.0000 mg | DELAYED_RELEASE_TABLET | Freq: Every day | ORAL | Status: DC
  Administered 2015-10-16 – 2015-10-19 (×4): 81 mg via ORAL
  Filled 2015-10-15 (×4): qty 1

## 2015-10-15 MED ORDER — LEVOTHYROXINE SODIUM 100 MCG PO TABS
100.0000 ug | ORAL_TABLET | Freq: Every day | ORAL | Status: DC
  Administered 2015-10-16 – 2015-10-19 (×4): 100 ug via ORAL
  Filled 2015-10-15 (×4): qty 1

## 2015-10-15 MED ORDER — POLYVINYL ALCOHOL 1.4 % OP SOLN
1.0000 [drp] | Freq: Two times a day (BID) | OPHTHALMIC | Status: DC
  Administered 2015-10-15 – 2015-10-19 (×8): 1 [drp] via OPHTHALMIC
  Filled 2015-10-15: qty 15

## 2015-10-15 MED ORDER — FUROSEMIDE 10 MG/ML IJ SOLN
80.0000 mg | Freq: Once | INTRAMUSCULAR | Status: AC
  Administered 2015-10-15: 80 mg via INTRAVENOUS
  Filled 2015-10-15: qty 8

## 2015-10-15 MED ORDER — PANTOPRAZOLE SODIUM 40 MG PO TBEC
40.0000 mg | DELAYED_RELEASE_TABLET | Freq: Every day | ORAL | Status: DC
  Administered 2015-10-16 – 2015-10-19 (×4): 40 mg via ORAL
  Filled 2015-10-15 (×4): qty 1

## 2015-10-15 MED ORDER — DOCUSATE SODIUM 100 MG PO CAPS
200.0000 mg | ORAL_CAPSULE | Freq: Every day | ORAL | Status: DC
  Administered 2015-10-16 – 2015-10-19 (×4): 200 mg via ORAL
  Filled 2015-10-15 (×5): qty 2

## 2015-10-15 MED ORDER — AMIODARONE HCL 200 MG PO TABS
200.0000 mg | ORAL_TABLET | Freq: Every day | ORAL | Status: DC
  Administered 2015-10-16 – 2015-10-19 (×4): 200 mg via ORAL
  Filled 2015-10-15 (×4): qty 1

## 2015-10-15 NOTE — Telephone Encounter (Signed)
The pts BNP, CBC and CMET results came back (see lab results from 4/26). I advised Dr Irish Lack that [name], the pts daughter, states that the pt has been taking 80 mg BID of Lasix since Sunday 4/23. Per Dr Irish Lack the pt needs to be admitted into the hospital to receive IV Lasix. Per Shawn in bed control the pt will be in 3 east in telemetry bed. [name] is advised and she verbalized understanding and is in agreement with plan. [name] states that she will have the pt at the hospital in 1 to 2 hours and is aware to arrive at admitting at the [facility] entrance A at [facility].

## 2015-10-15 NOTE — Addendum Note (Signed)
Addended by: Velna Ochs on: 10/15/2015 01:48 PM   Modules accepted: Orders

## 2015-10-15 NOTE — Telephone Encounter (Addendum)
The pts daughter is advised and she is in agreement with bringing the pt to the office today for labwork.  She wants Dr Irish Lack to be aware that since Sunday 4/23 she has been giving the pt 80 mg of Lasix BID because the pt c/o SOB.  Also, the pt is using his O2 24/7 at this time because he states that he cant breath and she wants to know if that is ok with Dr Irish Lack.  Please advise.

## 2015-10-15 NOTE — Addendum Note (Signed)
Addended by: Velna Ochs on: 10/15/2015 01:47 PM   Modules accepted: Orders

## 2015-10-15 NOTE — H&P (Signed)
History & Physical    Patient ID: Shaun [name] MRN: [id number], DOB/AGE: Aug 28, 1914   Admit date: 10/15/2015   Primary Physician: Donnie Coffin, MD Primary Cardiologist: Dr. Irish Lack  Patient Profile    Pt is a 82 yo M w/ a PMH significant for CAD s/p CABG c/b inferior AMI, mod AS, and CKD p/w acute on chronic SOB.  Past Medical History   Past Medical History  Diagnosis Date  . Hypertension   . CAD (coronary artery disease)   . Osteoarthritis   . DDD (degenerative disc disease)   . Spinal stenosis   . Heart murmur   . Shortness of breath   . Myocardial infarct ([place])     11/05/11 , 1999  . Anxiety   . Pneumonia     hx of double pneumonia   . GERD (gastroesophageal reflux disease)   . Chronic kidney disease     hxof multiple kidney stones   . Cancer ([place])     hx of skin cancery   . Elevated cholesterol     ELEVATED CHOLESTEROL  . Arthritis     ARTHRITIS  . Kidney stone     KIDNEY STONE ,DR.Cushing  . Neuralgia, post-herpetic     POST HERPETIC NEURALGIA    Past Surgical History  Procedure Laterality Date  . Tonsillectomy    . Coronary artery bypass graft      1999  . Lithotripsy    . Hernia repair      bilateral 1947   . Cardiac catheterization    . Nasal septum surgery    . Cystoscopy with ureteroscopy  03/29/2012    Procedure: CYSTOSCOPY WITH URETEROSCOPY;  Surgeon: Molli Hazard, MD;  Location: [facility];  Service: Urology;  Laterality: Left;  . Inguinal hernia repair  04/29/2012    Procedure: HERNIA REPAIR INGUINAL INCARCERATED;  Surgeon: Ralene Ok, MD;  Location: [facility];  Service: General;  Laterality: Left;  left incarerated inguinal hernia repair with mesh    Allergies  Allergies  Allergen Reactions  . Cocaine Other (See Comments)    Reaction: "passed out" from 1970's septum surgery   History of Present Illness    Pt has a longstanding cardiac hx. Follows w/ Dr. Irish Lack. Has been followed for CAD and mod AS. Last hospitalized  for HF 07/2015. Gentle diuresis and discharged on lasix 60 mg PO bid. Required home O2 w/ ambulation at discharge. Echo at the time showed mild-moderately reduced LVEF and mod AS by peak velocity/mean gradient and AVA. For the past few weeks has been feeling progressively SOB. Even at rest with conversation. Any amount of exertion is difficult. Very fatigued. Endorses orthopnea and PND. Progressive BLE swelling. Taking lasix 80 mg PO bid per family. Had labs checked today which were notable for rising BNP. Admitted for presumptive diagnosis of HF.  Home Medications    Prior to Admission medications   Medication Sig Start Date End Date Taking? Authorizing Provider  amiodarone (PACERONE) 200 MG tablet Take 1 tablet (200 mg total) by mouth daily. 03/06/15   Jettie Booze, MD  aspirin EC 81 MG tablet Take 81 mg by mouth daily.    Historical Provider, MD  atorvastatin (LIPITOR) 40 MG tablet Take 1 tablet (40 mg total) by mouth daily. 05/02/15   Jettie Booze, MD  Cholecalciferol 1000 units capsule Take 2,000 Units by mouth daily.    Historical Provider, MD  clopidogrel (PLAVIX) 75 MG tablet TAKE 1 TABLET DAILY 02/03/15  Jettie Booze, MD  docusate sodium (COLACE) 100 MG capsule Take 200-300 mg by mouth 2 (two) times daily. Takes 2 caps in am and 3 caps in pm    Historical Provider, MD  furosemide (LASIX) 40 MG tablet Take 1.5 tablets (60 mg total) by mouth daily at 12 noon. 08/12/15   Annita Brod, MD  ipratropium (ATROVENT) 0.03 % nasal spray Place 2 sprays into both nostrils daily as needed for rhinitis.    Historical Provider, MD  isosorbide mononitrate (IMDUR) 60 MG 24 hr tablet TAKE 1 TABLET DAILY 12/05/14   Jettie Booze, MD  Misc Natural Products (NARCOSOFT HERBAL LAX PO) Take 2 tablets by mouth 2 (two) times daily.     Historical Provider, MD  Misc Natural Products (OSTEO BI-FLEX ADV JOINT SHIELD) TABS Take 1 tablet by mouth 2 (two) times daily.    Historical Provider, MD    Multiple Vitamins-Minerals (MULTIVITAMINS THER. W/MINERALS) TABS Take 1 tablet by mouth daily.     Historical Provider, MD  nitroGLYCERIN (NITROSTAT) 0.4 MG SL tablet Place 1 tablet (0.4 mg total) under the tongue every 5 (five) minutes as needed. For chest pain. 04/09/13   Jettie Booze, MD  omeprazole (PRILOSEC) 20 MG capsule Take 1 capsule (20 mg total) by mouth daily. 11/17/11   Jettie Booze, MD  Polyethyl Glycol-Propyl Glycol (SYSTANE) 0.4-0.3 % SOLN Apply 1 drop to eye 2 (two) times daily.    Historical Provider, MD  potassium citrate (UROCIT-K) 10 MEQ (1080 MG) SR tablet Take 10 mEq by mouth daily at 12 noon.  04/04/13   Historical Provider, MD  senna-docusate (SENOKOT-S) 8.6-50 MG tablet Take 1-2 tablets by mouth 2 (two) times daily. Takes 1 tab in am and 2 tabs in pm    Historical Provider, MD  SYNTHROID 100 MCG tablet Take 100 mcg by mouth daily. 11/01/14   Historical Provider, MD   Family History    Family History  Problem Relation Age of Onset  . Cancer Father   . Coronary artery disease Father   . Stroke Mother   . Heart disease Brother   . Heart disease Brother   . Alzheimer's disease Brother   . Alzheimer's disease Brother   . Heart disease Brother    Social History    Social History   Social History  . Marital Status: Married    Spouse Name: N/A  . Number of Children: N/A  . Years of Education: N/A   Occupational History  . Not on file.   Social History Main Topics  . Smoking status: Former Smoker    Types: Pipe  . Smokeless tobacco: Never Used  . Alcohol Use: No  . Drug Use: No  . Sexual Activity: Not Currently   Other Topics Concern  . Not on file   Social History Narrative    Review of Systems    General:  No chills, fever, night sweats or weight changes.  Cardiovascular:  Per HPI. Dermatological: No rash, lesions/masses Respiratory: No cough, dyspnea Urologic: No hematuria, dysuria Abdominal:   No nausea, vomiting, diarrhea,  bright red blood per rectum, melena, or hematemesis Neurologic:  No visual changes, wkns, changes in mental status. All other systems reviewed and are otherwise negative except as noted above.  Physical Exam    Blood pressure 104/66, pulse 77, temperature 98.3 F (36.8 C), temperature source Oral, resp. rate 18, height 5\' 3"  (1.6 m), weight 142 lb 3.2 oz (64.501 kg), SpO2 97 %.  General: Extremely pleasant, mild distress Psych: Normal affect. Neuro: Alert and oriented X 3. Moves all extremities spontaneously. HEENT: Normal  Neck: Supple without bruits. JVP elevated ~3-4 cm above clavicle at 45 degrees Lungs:  Decreased breath sounds, rales at bases Heart: RRR no s3, s4. III/VI systolic crescendo-decrescendo murmur does not obliterate 2nd heart sound Abdomen: Soft, non-tender, non-distended, BS + x 4.  Extremities: No clubbing, cyanosis. DP/PT/Radials 2+ and equal bilaterally. 2-3+ pitting edema to knees  Labs    Troponin (Point of Care Test) No results for input(s): TROPIPOC in the last 72 hours. No results for input(s): CKTOTAL, CKMB, TROPONINI in the last 72 hours. Lab Results  Component Value Date   WBC 8.9 10/15/2015   HGB 11.5* 10/15/2015   HCT 37.2* 10/15/2015   MCV 99.2 10/15/2015   PLT 183 10/15/2015    Recent Labs Lab 10/15/15 1340  NA 135  K 4.7  CL 95*  CO2 30  BUN 34*  CREATININE 1.44*  CALCIUM 9.8  PROT 6.3*  BILITOT 0.4  ALKPHOS 77  ALT 15*  AST 26  GLUCOSE 106*   Lab Results  Component Value Date   CHOL 153 05/27/2014   HDL 45.20 05/27/2014   LDLCALC 75 05/27/2014   TRIG 162.0* 05/27/2014   No results found for: [facility]   Radiology Studies    No results found.  ECG & Cardiac Imaging    EKG - pending  CXR - pending  Assessment & Plan   Pt is a 82 yo M w/ a PMH significant for CAD s/p CABG c/b inferior AMI, mod AS, and CKD p/w acute on chronic SOB.  #HFrEF: Acute on chronic systolic. Mild-moderately reduced EF. Likely diastolic  component. Hx and PEX classic for HF. Unclear if he has ever been challenged with ACEI/ARB, beta-blocker, or MRA. Only on lasix. -admit telemetry -check NT-proBNP, EKG, CXR -consider GDMT per above as tolerated -lasix 80 mg IV X 1, reassess in AM -strict Is/Os, replete lytes  #CAD: Unclear if DAPT is indicated. No active ischemic symptoms or equivalents. -continue ASA, Plavix, and atorva  #AS: Moderate on last echo. Consistent with PEX. Would not be SAVR or TAVR candidate due to frailty regardless.  #CKD: Stage III -check renal fxn -renally dose medications -avoid nephrotoxins  #FEN/GI -cardiac diet -diuresis -replete lytes PRN  #PPx -heparin SUBQ -SCDs  #Dispo -pending w/u and eval  Signed, Bari Edward, MD 10/15/2015, 8:52 PM

## 2015-10-15 NOTE — Telephone Encounter (Signed)
Pt's daughter calling requesting a sooner appt with Ethelene Browns feels he needs to come in as she and Dr. Irish Lack have been discussing pt's fluid and oxygen issues over my chart-he gets regulated for awhile then goes back -pls advise

## 2015-10-15 NOTE — Telephone Encounter (Signed)
The pts daughter is requesting an apt today with Dr Irish Lack as she states the pt needs lab work and possibly medication adjustment. She states that the pt is doing better than he was but she wants Dr Irish Lack to lay eyes on him.  We have no available apts today to see the pt. I called the pts daughter back and left a message on her VM stating that per Dr Irish Lack the pt needs to have a CMET and a CBC drawn today and that the pt may not need to be seen depending on his lab results. I left this office's phone number so she can call me back.

## 2015-10-16 ENCOUNTER — Inpatient Hospital Stay (HOSPITAL_COMMUNITY): Payer: MEDICARE

## 2015-10-16 ENCOUNTER — Encounter (HOSPITAL_COMMUNITY): Payer: Self-pay | Admitting: General Practice

## 2015-10-16 DIAGNOSIS — I5023 Acute on chronic systolic (congestive) heart failure: Secondary | ICD-10-CM

## 2015-10-16 DIAGNOSIS — R0602 Shortness of breath: Secondary | ICD-10-CM

## 2015-10-16 DIAGNOSIS — I350 Nonrheumatic aortic (valve) stenosis: Secondary | ICD-10-CM

## 2015-10-16 LAB — CBC WITH DIFFERENTIAL/PLATELET
BASOS ABS: 0 10*3/uL (ref 0.0–0.1)
BASOS PCT: 0 %
EOS ABS: 0.1 10*3/uL (ref 0.0–0.7)
EOS PCT: 2 %
HCT: 36.6 % — ABNORMAL LOW (ref 39.0–52.0)
Hemoglobin: 11.5 g/dL — ABNORMAL LOW (ref 13.0–17.0)
LYMPHS ABS: 0.8 10*3/uL (ref 0.7–4.0)
LYMPHS PCT: 12 %
MCH: 31 pg (ref 26.0–34.0)
MCHC: 31.4 g/dL (ref 30.0–36.0)
MCV: 98.7 fL (ref 78.0–100.0)
Monocytes Absolute: 0.7 10*3/uL (ref 0.1–1.0)
Monocytes Relative: 11 %
NEUTROS ABS: 5.2 10*3/uL (ref 1.7–7.7)
NEUTROS PCT: 75 %
PLATELETS: 175 10*3/uL (ref 150–400)
RBC: 3.71 MIL/uL — AB (ref 4.22–5.81)
RDW: 15.3 % (ref 11.5–15.5)
WBC: 6.9 10*3/uL (ref 4.0–10.5)

## 2015-10-16 LAB — COMPREHENSIVE METABOLIC PANEL
ALT: 15 U/L — AB (ref 17–63)
ANION GAP: 9 (ref 5–15)
AST: 25 U/L (ref 15–41)
Albumin: 3 g/dL — ABNORMAL LOW (ref 3.5–5.0)
Alkaline Phosphatase: 74 U/L (ref 38–126)
BUN: 30 mg/dL — ABNORMAL HIGH (ref 6–20)
CHLORIDE: 99 mmol/L — AB (ref 101–111)
CO2: 30 mmol/L (ref 22–32)
CREATININE: 1.32 mg/dL — AB (ref 0.61–1.24)
Calcium: 9.6 mg/dL (ref 8.9–10.3)
GFR calc non Af Amer: 43 mL/min — ABNORMAL LOW (ref >60)
GFR, EST AFRICAN AMERICAN: 50 mL/min — AB (ref >60)
Glucose, Bld: 93 mg/dL (ref 65–99)
Potassium: 4.1 mmol/L (ref 3.5–5.1)
SODIUM: 138 mmol/L (ref 135–145)
Total Bilirubin: 0.6 mg/dL (ref 0.3–1.2)
Total Protein: 6.2 g/dL — ABNORMAL LOW (ref 6.5–8.1)

## 2015-10-16 LAB — PROTIME-INR
INR: 1.22 (ref 0.00–1.49)
Prothrombin Time: 15.6 seconds — ABNORMAL HIGH (ref 11.6–15.2)

## 2015-10-16 LAB — MAGNESIUM: MAGNESIUM: 2.3 mg/dL (ref 1.7–2.4)

## 2015-10-16 MED ORDER — FUROSEMIDE 10 MG/ML IJ SOLN
40.0000 mg | Freq: Two times a day (BID) | INTRAMUSCULAR | Status: DC
  Administered 2015-10-16 – 2015-10-17 (×3): 40 mg via INTRAVENOUS
  Filled 2015-10-16 (×3): qty 4

## 2015-10-16 MED ORDER — POTASSIUM CHLORIDE CRYS ER 20 MEQ PO TBCR
20.0000 meq | EXTENDED_RELEASE_TABLET | Freq: Two times a day (BID) | ORAL | Status: DC
  Administered 2015-10-16 – 2015-10-17 (×4): 20 meq via ORAL
  Filled 2015-10-16 (×5): qty 1

## 2015-10-16 MED ORDER — CETYLPYRIDINIUM CHLORIDE 0.05 % MT LIQD
7.0000 mL | Freq: Two times a day (BID) | OROMUCOSAL | Status: DC
  Administered 2015-10-16 – 2015-10-19 (×7): 7 mL via OROMUCOSAL

## 2015-10-16 NOTE — Progress Notes (Signed)
Patient Name: Shaun [name] Date of Encounter: 10/16/2015  Active Problems:   Acute on chronic systolic (congestive) heart failure [facility])   Primary Cardiologist: Dr. Irish Lack Patient Profile: Mr. Woitas is a 82 yo male with a PMH significant for CAD s/p CABG c/b inferior AMI, mod AS, CKD, HTN, and HLD. He presented to the ED on 10/15/15 with SOB and BLE edema.    SUBJECTIVE: Feels better this morning, breathing better. Denies chest pain, has a little SOB.   OBJECTIVE Filed Vitals:   10/15/15 1943 10/15/15 2355 10/16/15 0604  BP: 104/66 100/70 102/66  Pulse: 77 72 74  Temp: 98.3 F (36.8 C) 98 F (36.7 C) 98.3 F (36.8 C)  TempSrc: Oral Oral Oral  Resp: 18 18 17   Height: 5\' 3"  (1.6 m)    Weight: 142 lb 3.2 oz (64.501 kg)  142 lb 3.1 oz (64.498 kg)  SpO2: 97% 98% 99%    Intake/Output Summary (Last 24 hours) at 10/16/15 0958 Last data filed at 10/16/15 [id number]  Gross per 24 hour  Intake    240 ml  Output    675 ml  Net   -435 ml   Filed Weights   10/15/15 1943 10/16/15 0604  Weight: 142 lb 3.2 oz (64.501 kg) 142 lb 3.1 oz (64.498 kg)    PHYSICAL EXAM General: Well developed, well nourished, male in no acute distress. Head: Normocephalic, atraumatic.  Neck: Supple without bruits, jaw JVD. Lungs:  Resp regular and unlabored, coarse crackles in BLL Heart: RRR, S1, S2, no S3, S4, or murmur; no rub. Abdomen: Soft, non-tender, non-distended, BS + x 4.  Extremities: No clubbing, cyanosis, +3 ankle edema.  Neuro: Alert and oriented X 3. Moves all extremities spontaneously. Psych: Normal affect.  LABS: CBC: Recent Labs  10/15/15 2052 10/16/15 0340  WBC 7.0 6.9  NEUTROABS 5.1 5.2  HGB 11.6* 11.5*  HCT 37.4* 36.6*  MCV 98.4 98.7  PLT 177 175   INR: Recent Labs  10/16/15 0340  INR [id number]   Basic Metabolic Panel: Recent Labs  10/15/15 2052 10/16/15 0340  NA 137 138  K 4.3 4.1  CL 98* 99*  CO2 29 30  GLUCOSE 101* 93  BUN 32* 30*  CREATININE  1.34* 1.32*  CALCIUM 9.8 9.6  MG 2.5* 2.3   Liver Function Tests: Recent Labs  10/15/15 2052 10/16/15 0340  AST 25 25  ALT 14* 15*  ALKPHOS 72 74  BILITOT 0.6 0.6  PROT 6.7 6.2*  ALBUMIN 3.0* 3.0*   BNP:  B NATRIURETIC PEPTIDE  Date/Time Value Ref Range Status  10/15/2015 08:52 PM 1076.1* 0.0 - 100.0 pg/mL Final  10/15/2015 01:40 PM 1142.3* 0.0 - 100.0 pg/mL Final    Thyroid Function Tests: Recent Labs  10/15/15 2052  TSH 5.831*     Current facility-administered medications:  .  0.9 %  sodium chloride infusion, 250 mL, Intravenous, PRN, Jay Schlichter, MD .  acetaminophen (TYLENOL) tablet 650 mg, 650 mg, Oral, Q4H PRN, Jay Schlichter, MD .  amiodarone (PACERONE) tablet 200 mg, 200 mg, Oral, Daily, Jay Schlichter, MD, 200 mg at 10/16/15 0950 .  antiseptic oral rinse (CPC / CETYLPYRIDINIUM CHLORIDE 0.05%) solution 7 mL, 7 mL, Mouth Rinse, BID, Jay Schlichter, MD, 7 mL at 10/16/15 0957 .  aspirin EC tablet 81 mg, 81 mg, Oral, Daily, Jay Schlichter, MD, 81 mg at 10/16/15 0950 .  atorvastatin (LIPITOR) tablet 40 mg, 40 mg, Oral, Daily, Jay Schlichter, MD, 40 mg at 10/16/15  104 .  cholecalciferol (VITAMIN D) tablet 2,000 Units, 2,000 Units, Oral, Daily, Jay Schlichter, MD, 2,000 Units at 10/16/15 0950 .  clopidogrel (PLAVIX) tablet 75 mg, 75 mg, Oral, Daily, Jay Schlichter, MD, 75 mg at 10/16/15 0950 .  docusate sodium (COLACE) capsule 200 mg, 200 mg, Oral, Daily, Jay Schlichter, MD, 200 mg at 10/16/15 0950 .  docusate sodium (COLACE) capsule 300 mg, 300 mg, Oral, QHS, Jay Schlichter, MD, 300 mg at 10/15/15 2331 .  heparin injection 5,000 Units, 5,000 Units, Subcutaneous, Q8H, Jay Schlichter, MD, 5,000 Units at 10/16/15 [phone]  isosorbide mononitrate (IMDUR) 24 hr tablet 60 mg, 60 mg, Oral, Daily, Jay Schlichter, MD, 60 mg at 10/16/15 0950 .  levothyroxine (SYNTHROID, LEVOTHROID) tablet 100 mcg, 100 mcg, Oral, QAC breakfast, Jay Schlichter, MD, 100 mcg at 10/16/15 [phone] .   multivitamin with minerals tablet 1 tablet, 1 tablet, Oral, Daily, Jay Schlichter, MD, 1 tablet at 10/16/15 0949 .  ondansetron (ZOFRAN) injection 4 mg, 4 mg, Intravenous, Q6H PRN, Jay Schlichter, MD .  pantoprazole (PROTONIX) EC tablet 40 mg, 40 mg, Oral, Daily, Jay Schlichter, MD, 40 mg at 10/16/15 0950 .  polyvinyl alcohol (LIQUIFILM TEARS) 1.4 % ophthalmic solution 1 drop, 1 drop, Both Eyes, BID, Jay Schlichter, MD, 1 drop at 10/15/15 2344 .  senna-docusate (Senokot-S) tablet 1 tablet, 1 tablet, Oral, Daily, Jay Schlichter, MD, 1 tablet at 10/16/15 0950 .  senna-docusate (Senokot-S) tablet 2 tablet, 2 tablet, Oral, QHS, Jay Schlichter, MD, 2 tablet at 10/15/15 2332 .  sodium chloride flush (NS) 0.9 % injection 3 mL, 3 mL, Intravenous, Q12H, Jay Schlichter, MD, 3 mL at 10/16/15 1000 .  sodium chloride flush (NS) 0.9 % injection 3 mL, 3 mL, Intravenous, PRN, Jay Schlichter, MD   TELE: NSR  ECG: NSR  Radiology/Studies: Dg Chest Port 1 View  10/16/2015  CLINICAL DATA:  Shortness of breath. EXAM: PORTABLE CHEST 1 VIEW COMPARISON:  08/10/2015. FINDINGS: Prior CABG. Stable cardiomegaly. Slight improvement of bilateral from interstitial prominence and pleural effusions suggesting slight improvement of congestive heart failure . No pneumothorax. IMPRESSION: 1. Prior CABG.  Stable cardiomegaly. 2. Bilateral pulmonary interstitial prominence and small pleural effusions consistent congestive heart failure. Slight improvement from prior exam . Electronically Signed   By: Marcello Moores  Register   On: 10/16/2015 07:24     Current Medications:  . amiodarone  200 mg Oral Daily  . antiseptic oral rinse  7 mL Mouth Rinse BID  . aspirin EC  81 mg Oral Daily  . atorvastatin  40 mg Oral Daily  . cholecalciferol  2,000 Units Oral Daily  . clopidogrel  75 mg Oral Daily  . docusate sodium  200 mg Oral Daily  . docusate sodium  300 mg Oral QHS  . heparin  5,000 Units Subcutaneous Q8H  . isosorbide mononitrate  60  mg Oral Daily  . levothyroxine  100 mcg Oral QAC breakfast  . multivitamin with minerals  1 tablet Oral Daily  . pantoprazole  40 mg Oral Daily  . polyvinyl alcohol  1 drop Both Eyes BID  . senna-docusate  1 tablet Oral Daily  . senna-docusate  2 tablet Oral QHS  . sodium chloride flush  3 mL Intravenous Q12H      ASSESSMENT AND PLAN: Active Problems:   Acute on chronic systolic (congestive) heart failure ([place])  1. Acute on chronic systolic CHF: last Echo was in 2013 and showed LVEF of 40-45%, and moderate AS.  He needs repeat Echo this admission.  He is volume overloaded on exam with +3 ankle edema and jaw JVD.  His daughter reports that when his weight gets above 140, he is more SOB and doesn't feel well.  He is 142 lbs today. He will need IV diuresis today, Scr is 1.32 and BUN is 30. He is negative 462ml so far. We will follow renal function closely.   He is on isosorbide, BP is stable to soft with SBP in 100's.  No ACEI in setting of chronic renal insufficiency. He is not on beta blocker therapy, can add with caution once he is closer to euvolemia. Continue daily weights and strict I & O.   2. CAD: On Plavix after inferior MI in 2013. He is largely inactive and can no longer walk without assistance. Denies chest pain. On moderate dose statin.   3. Atrial tachycardia: Had atrial tachycardia following MI in 2013, Amio was started.  He has continued with this. No tachycardia noted on tele. TSH is elevated at 5.831, liver enzymes ok.    Signed, Arbutus Leas , NP 9:58 AM 10/16/2015 Pager [phone]

## 2015-10-17 ENCOUNTER — Inpatient Hospital Stay (HOSPITAL_COMMUNITY): Payer: MEDICARE

## 2015-10-17 DIAGNOSIS — Z7189 Other specified counseling: Secondary | ICD-10-CM

## 2015-10-17 DIAGNOSIS — R0600 Dyspnea, unspecified: Secondary | ICD-10-CM

## 2015-10-17 DIAGNOSIS — I509 Heart failure, unspecified: Secondary | ICD-10-CM

## 2015-10-17 DIAGNOSIS — Z515 Encounter for palliative care: Secondary | ICD-10-CM

## 2015-10-17 LAB — CBC WITH DIFFERENTIAL/PLATELET
BASOS ABS: 0 10*3/uL (ref 0.0–0.1)
BASOS PCT: 0 %
EOS PCT: 2 %
Eosinophils Absolute: 0.1 10*3/uL (ref 0.0–0.7)
HCT: 37.2 % — ABNORMAL LOW (ref 39.0–52.0)
Hemoglobin: 11.3 g/dL — ABNORMAL LOW (ref 13.0–17.0)
Lymphocytes Relative: 12 %
Lymphs Abs: 0.9 10*3/uL (ref 0.7–4.0)
MCH: 30.4 pg (ref 26.0–34.0)
MCHC: 30.4 g/dL (ref 30.0–36.0)
MCV: 100 fL (ref 78.0–100.0)
MONO ABS: 0.8 10*3/uL (ref 0.1–1.0)
Monocytes Relative: 10 %
Neutro Abs: 6 10*3/uL (ref 1.7–7.7)
Neutrophils Relative %: 76 %
PLATELETS: 187 10*3/uL (ref 150–400)
RBC: 3.72 MIL/uL — ABNORMAL LOW (ref 4.22–5.81)
RDW: 15.4 % (ref 11.5–15.5)
WBC: 7.8 10*3/uL (ref 4.0–10.5)

## 2015-10-17 LAB — ECHOCARDIOGRAM COMPLETE
HEIGHTINCHES: 63 in
Weight: 2193.6 oz

## 2015-10-17 LAB — COMPREHENSIVE METABOLIC PANEL
ALBUMIN: 2.9 g/dL — AB (ref 3.5–5.0)
ALT: 13 U/L — ABNORMAL LOW (ref 17–63)
AST: 23 U/L (ref 15–41)
Alkaline Phosphatase: 72 U/L (ref 38–126)
Anion gap: 10 (ref 5–15)
BUN: 32 mg/dL — AB (ref 6–20)
CHLORIDE: 100 mmol/L — AB (ref 101–111)
CO2: 30 mmol/L (ref 22–32)
Calcium: 9.7 mg/dL (ref 8.9–10.3)
Creatinine, Ser: 1.41 mg/dL — ABNORMAL HIGH (ref 0.61–1.24)
GFR calc Af Amer: 46 mL/min — ABNORMAL LOW (ref >60)
GFR calc non Af Amer: 40 mL/min — ABNORMAL LOW (ref >60)
GLUCOSE: 94 mg/dL (ref 65–99)
POTASSIUM: 4.5 mmol/L (ref 3.5–5.1)
Sodium: 140 mmol/L (ref 135–145)
Total Bilirubin: 0.5 mg/dL (ref 0.3–1.2)
Total Protein: 6.4 g/dL — ABNORMAL LOW (ref 6.5–8.1)

## 2015-10-17 LAB — PROTIME-INR
INR: 1.25 (ref 0.00–1.49)
PROTHROMBIN TIME: 15.8 s — AB (ref 11.6–15.2)

## 2015-10-17 LAB — MAGNESIUM: Magnesium: 2.3 mg/dL (ref 1.7–2.4)

## 2015-10-17 MED ORDER — MORPHINE SULFATE (PF) 2 MG/ML IV SOLN
1.0000 mg | INTRAVENOUS | Status: DC | PRN
  Administered 2015-10-17 (×2): 1 mg via INTRAVENOUS
  Filled 2015-10-17 (×2): qty 1

## 2015-10-17 MED ORDER — FUROSEMIDE 80 MG PO TABS
80.0000 mg | ORAL_TABLET | Freq: Two times a day (BID) | ORAL | Status: DC
  Administered 2015-10-17 – 2015-10-19 (×5): 80 mg via ORAL
  Filled 2015-10-17 (×5): qty 1

## 2015-10-17 MED ORDER — GUAIFENESIN ER 600 MG PO TB12
600.0000 mg | ORAL_TABLET | Freq: Two times a day (BID) | ORAL | Status: DC
  Administered 2015-10-17 – 2015-10-19 (×5): 600 mg via ORAL
  Filled 2015-10-17 (×5): qty 1

## 2015-10-17 MED ORDER — FUROSEMIDE 80 MG PO TABS: 80.0000 mg | ORAL_TABLET | Freq: Two times a day (BID) | ORAL | Status: DC

## 2015-10-17 MED ORDER — FUROSEMIDE 10 MG/ML IJ SOLN
40.0000 mg | Freq: Once | INTRAMUSCULAR | Status: AC
  Administered 2015-10-17: 40 mg via INTRAVENOUS
  Filled 2015-10-17: qty 4

## 2015-10-17 MED ORDER — LORAZEPAM 2 MG/ML IJ SOLN: 0.5000 mg | INTRAMUSCULAR | Status: DC | PRN

## 2015-10-17 MED ORDER — FUROSEMIDE 40 MG PO TABS: 40.0000 mg | ORAL_TABLET | Freq: Two times a day (BID) | ORAL | Status: DC

## 2015-10-17 NOTE — Consult Note (Signed)
Consultation Note Date: 10/17/2015   Patient Name: Shaun [name]  DOB: [birth year]  MRN: [id number]  Age / Sex: 82 y.o., male  PCP: Shaun Coffin, MD Referring Physician: Jay Schlichter, MD  Reason for Consultation: Establishing goals of care and Non pain symptom management  HPI/Patient Profile:Shaun [name] is a 82 yo male with a PMH significant for CAD s/p CABG c/b inferior AMI, AS, CKD, HTN, and HLD. He presented to the ED on 10/15/15 with SOB and BLE edema. He has been found to have severe AS and CHF.    Clinical Assessment and Goals of Care: I met today with patient and his step-daughter and we called his Step-daughter Shaun [name] as well.  He reports that the most important things are his family, being at home, and being comfortable.  We discussed hospice as tool that will be beneficial as we have reached a point where we are trying to fix problems that are not fixable.  He reports that he continues to feel short of breath.  This has kept him from being able to get a good night's sleep.   SUMMARY OF RECOMMENDATIONS   - Goal of care at this point is comfort measures with diuretics and morphine as needed to help with SOB.  - Added on morphine for use on as needed basis for dyspnea. - Discussed hospice at length.   - he would like to pursue discharge home with hospice support through [facility]  Code Status/Advance Care Planning:  DNR   Symptom Management:   SOB: Morphine as needed  Anxiety: Ativan as needed  On discharge, would recommend scripts for: - Morphine Concentrate 57m/0.5ml: 550m(0.2514msublingual every 1 hour as needed for pain or shortness of breath: Disp 77m80mLorazepam 2mg/48mconcentrated solution: 1mg (26mml) s7mingual every 4 hours as needed for anxiety: Disp 77ml - 36mol 2mg/ml s70mtion: 0.5mg (0.2558m subl78mal every 4 hours as needed  for agitation or nausea: Disp 77ml    Pal49mive Prophylaxis:   Aspiration, Bowel Regimen, Delirium Protocol and Frequent Pain Assessment  Psycho-social/Spiritual:   Desire for further Chaplaincy support:no  Additional Recommendations: Education on Hospice and Grief/Bereavement Support  Prognosis:   < 6 months  Discharge Planning: Home with Hospice      Primary Diagnoses: Present on Admission:  . Acute on chronic systolic (congestive) heart failure (HCC)  I haveCenterviewed the medical record, interviewed the patient and family, and examined the patient. The following aspects are pertinent.  Past Medical History  Diagnosis Date  . Hypertension   . CAD (coronary artery disease)   . Osteoarthritis   . DDD (degenerative disc disease)   . Spinal stenosis   . Heart murmur   . Shortness of breath   . Myocardial infarct (HCC)     5/1[place] , 1999  . Anxiety   . Pneumonia     hx of double pneumonia   . GERD (gastroesophageal reflux disease)   . Chronic kidney disease     hxof multiple kidney stones   .  Cancer ([place])     hx of skin cancery   . Elevated cholesterol     ELEVATED CHOLESTEROL  . Arthritis     ARTHRITIS  . Kidney stone     KIDNEY STONE ,Shaun [name]  . Neuralgia, post-herpetic     POST HERPETIC NEURALGIA   Social History   Social History  . Marital Status: Married    Spouse Name: N/A  . Number of Children: N/A  . Years of Education: N/A   Social History Main Topics  . Smoking status: Former Smoker    Types: Pipe  . Smokeless tobacco: Never Used  . Alcohol Use: No  . Drug Use: No  . Sexual Activity: Not Currently   Other Topics Concern  . None   Social History Narrative   Family History  Problem Relation Age of Onset  . Cancer Father   . Coronary artery disease Father   . Stroke Mother   . Heart disease Brother   . Heart disease Brother   . Alzheimer's disease Brother   . Alzheimer's disease Brother   . Heart disease Brother     Scheduled Meds: . amiodarone  200 mg Oral Daily  . antiseptic oral rinse  7 mL Mouth Rinse BID  . aspirin EC  81 mg Oral Daily  . atorvastatin  40 mg Oral Daily  . cholecalciferol  2,000 Units Oral Daily  . clopidogrel  75 mg Oral Daily  . docusate sodium  200 mg Oral Daily  . docusate sodium  300 mg Oral QHS  . furosemide  80 mg Oral BID  . guaiFENesin  600 mg Oral BID  . heparin  5,000 Units Subcutaneous Q8H  . isosorbide mononitrate  60 mg Oral Daily  . levothyroxine  100 mcg Oral QAC breakfast  . multivitamin with minerals  1 tablet Oral Daily  . pantoprazole  40 mg Oral Daily  . polyvinyl alcohol  1 drop Both Eyes BID  . potassium chloride  20 mEq Oral BID  . senna-docusate  1 tablet Oral Daily  . senna-docusate  2 tablet Oral QHS  . sodium chloride flush  3 mL Intravenous Q12H   Continuous Infusions:  PRN Meds:.sodium chloride, acetaminophen, LORazepam, morphine injection, ondansetron (ZOFRAN) IV, sodium chloride flush Medications Prior to Admission:  Prior to Admission medications   Medication Sig Start Date End Date Taking? Authorizing Provider  amiodarone (PACERONE) 200 MG tablet Take 1 tablet (200 mg total) by mouth daily. 03/06/15  Yes Jettie Booze, MD  aspirin EC 81 MG tablet Take 81 mg by mouth daily.   Yes Historical Provider, MD  atorvastatin (LIPITOR) 40 MG tablet Take 1 tablet (40 mg total) by mouth daily. 05/02/15  Yes Jettie Booze, MD  clopidogrel (PLAVIX) 75 MG tablet TAKE 1 TABLET DAILY 02/03/15  Yes Jettie Booze, MD  docusate sodium (COLACE) 100 MG capsule Take 200-300 mg by mouth 2 (two) times daily. Takes 2 caps in am and 3 caps in pm   Yes Historical Provider, MD  furosemide (LASIX) 40 MG tablet Take 1.5 tablets (60 mg total) by mouth daily at 12 noon. Patient taking differently: Take 80 mg by mouth 2 (two) times daily.  08/12/15  Yes Annita Brod, MD  ipratropium (ATROVENT) 0.03 % nasal spray Place 2 sprays into both nostrils  daily as needed for rhinitis.   Yes Historical Provider, MD  isosorbide mononitrate (IMDUR) 60 MG 24 hr tablet TAKE 1 TABLET DAILY 12/05/14  Yes Charlann Lange  Irish Lack, MD  Misc Natural Products (NARCOSOFT HERBAL LAX PO) Take 2 tablets by mouth 2 (two) times daily.    Yes Historical Provider, MD  Misc Natural Products (OSTEO BI-FLEX ADV JOINT SHIELD) TABS Take 1 tablet by mouth 2 (two) times daily.   Yes Historical Provider, MD  Multiple Vitamins-Minerals (MULTIVITAMINS THER. W/MINERALS) TABS Take 1 tablet by mouth every evening.    Yes Historical Provider, MD  Multiple Vitamins-Minerals (PRESERVISION AREDS 2 PO) Take 1 tablet by mouth 2 (two) times daily.   Yes Historical Provider, MD  nitroGLYCERIN (NITROSTAT) 0.4 MG SL tablet Place 1 tablet (0.4 mg total) under the tongue every 5 (five) minutes as needed. For chest pain. 04/09/13  Yes Jettie Booze, MD  omeprazole (PRILOSEC) 20 MG capsule Take 1 capsule (20 mg total) by mouth daily. 11/17/11  Yes Jettie Booze, MD  Polyethyl Glycol-Propyl Glycol (SYSTANE) 0.4-0.3 % SOLN Apply 1 drop to eye 2 (two) times daily. In the afternoon use 1 drop both eyes. In the evening use 2 drops in the left eye, and 1 drop in the right eye   Yes Historical Provider, MD  potassium citrate (UROCIT-K) 10 MEQ (1080 MG) SR tablet Take 10 mEq by mouth 2 (two) times daily.  04/04/13  Yes Historical Provider, MD  senna-docusate (SENOKOT-S) 8.6-50 MG tablet Take 1-2 tablets by mouth 2 (two) times daily. Takes 1 tab in am and 2 tabs in pm   Yes Historical Provider, MD  SYNTHROID 100 MCG tablet Take 100 mcg by mouth daily before breakfast.  11/01/14  Yes Historical Provider, MD   Allergies  Allergen Reactions  . Cocaine Other (See Comments)    Reaction: "passed out" from 1970's septum surgery   Review of Systems  Physical Exam  General: Alert, awake, in mild respiratory distress.  HEENT: No bruits, no goiter, no JVD Heart: Regular rate and rhythm. No murmur  appreciated. Lungs: fair air movement, clear Abdomen: Soft, nontender, nondistended, positive bowel sounds.  Ext: BL edema Skin: Warm and dry Neuro: Grossly intact, nonfocal.  Vital Signs: BP 96/62 mmHg  Pulse 89  Temp(Src) 97.8 F (36.6 C) (Oral)  Resp 20  Ht 5' 3"  (1.6 m)  Wt 62.188 kg (137 lb 1.6 oz)  BMI 24.29 kg/m2  SpO2 93% Pain Assessment: No/denies pain       SpO2: SpO2: 93 % O2 Device:SpO2: 93 % O2 Flow Rate: .O2 Flow Rate (L/min): 4 L/min  IO: Intake/output summary:  Intake/Output Summary (Last 24 hours) at 10/17/15 2249 Last data filed at 10/17/15 2100  Gross per 24 hour  Intake    720 ml  Output    900 ml  Net   -180 ml    LBM: Last BM Date: 10/15/15 Baseline Weight: Weight: 64.501 kg (142 lb 3.2 oz) Most recent weight: Weight: 62.188 kg (137 lb 1.6 oz) (bed scale)     Palliative Assessment/Data:     Time In: 1840 Time Out: 1940 Time Total: 60 Greater than 50%  of this time was spent counseling and coordinating care related to the above assessment and plan.  Signed by: Micheline Rough, MD   Please contact Palliative Medicine Team phone at [phone] for questions and concerns.  For individual provider: See Shea Evans

## 2015-10-17 NOTE — Progress Notes (Signed)
I had a long discussion with patient and his step daughter.  His echo showed mild LV dysfunction with severe AS.  This is most likely etiology of acute CHF exacerbation.  He had an episode of diaphoresis with increased SOB and was given 40mg  IV Lasix and has had good UOP but still SOB.  Lungs have crackles at bases.  I have spoken with his step daughter and the patient.  He has a living will and has been DNR in the past.  I will update code status in EPIC to DNR.  I have had a long discussion in regards to life expectancy with severe AS and CHF.  Given his advanced age and comorbidities he is not a candidate for surgical or percutaneous valve replacement.  Goals of care at this point are comfort measures with diuretics and morphine as needed to help with SOB.  I have recommended that we get a Palliative Care consult.  The patient would like care at home if possible to be with his wife.

## 2015-10-17 NOTE — Evaluation (Signed)
Physical Therapy Evaluation Patient Details Name: ALIAS GRAHN MRN: [id number] DOB: 04/30/1915 Today's Date: 10/17/2015   History of Present Illness  Mr. Cartee is a 82 yo male with a PMH significant for CAD s/p CABG c/b inferior AMI, mod AS, CKD, HTN, and HLD. He presented to the ED on 10/15/15 with SOB and BLE edema.   Clinical Impression  Pt admitted with above diagnosis. Pt currently with functional limitations due to the deficits listed below (see PT Problem List). Pt was able to ambulate around bed to chair but did have episode of right knee buckling needing assist.  Pt has 24 hour care at home and will eventually go home with this assist.  Desats and was not previously on home O2.  Will possibly need home O2 on d/c this time. Will follow acutely.   Pt will benefit from skilled PT to increase their independence and safety with mobility to allow discharge to the venue listed below.      Follow Up Recommendations Home health PT;Supervision/Assistance - 24 hour    Equipment Recommendations  Other (comment) (home O2)    Recommendations for Other Services       Precautions / Restrictions Precautions Precautions: Fall Restrictions Weight Bearing Restrictions: No      Mobility  Bed Mobility Overal bed mobility: Needs Assistance Bed Mobility: Supine to Sit     Supine to sit: Min assist;Mod assist     General bed mobility comments: pt reached for PT hand and pulled up on PT to come to EOB.   Transfers Overall transfer level: Needs assistance Equipment used: Rolling walker (2 wheeled) Transfers: Sit to/from Stand Sit to Stand: Min assist         General transfer comment: Pt was able to stand to RW with min assist and cues for hand placement.  Needed steadying assist once up.  Slightly flexed posture as well.    Ambulation/Gait Ambulation/Gait assistance: Min assist;Mod assist Ambulation Distance (Feet): 20 Feet Assistive device: Rolling walker (2 wheeled) Gait  Pattern/deviations: Decreased stride length;Leaning posteriorly;Staggering left;Staggering right;Wide base of support;Trunk flexed;Decreased step length - right;Step-to pattern   Gait velocity interpretation: Below normal speed for age/gender General Gait Details: Pt unsteady with ambulation with RW.  Right knee injured previuous per pt.  Pt with a few LOB needing mod assist to recover.  Pt had episode of knee buckling but was almost to chair therefore controlled descent into chair.  Pt needs physical assist to ambulate.    Stairs            Wheelchair Mobility    Modified Rankin (Stroke Patients Only)       Balance Overall balance assessment: Needs assistance Sitting-balance support: No upper extremity supported;Feet supported Sitting balance-Leahy Scale: Fair     Standing balance support: Bilateral upper extremity supported;During functional activity Standing balance-Leahy Scale: Poor Standing balance comment: relies heavily on RW.  Cannot withstand standing without UE support and physical assist.                              Pertinent Vitals/Pain Pain Assessment: No/denies pain   SATURATION QUALIFICATIONS: (This note is used to comply with regulatory documentation for home oxygen)  Patient Saturations on Room Air at Rest = 87-90%  Patient Saturations on Room Air while Ambulating = 82-88%  Patient Saturations on 4 Liters of oxygen while Ambulating = 90-92%  Please briefly explain why patient needs home oxygen:Pt desat on  RA.  Needed 4LO2 to keep sats >90% at rest and with ambulation. Nursing made aware.     Home Living Family/patient expects to be discharged to:: Private residence Living Arrangements: Spouse/significant other Available Help at Discharge: Personal care attendant;Family;Available 24 hours/day Type of Home: House Home Access: Ramped entrance     Home Layout: One level Home Equipment: Wheelchair - manual;Grab bars - toilet;Grab bars -  tub/shower;Toilet riser;Walker - 2 wheels;Walker - 4 wheels;Cane - single point;Shower seat (2Lhome O2) Additional Comments: has [employer] 24 hour caregivers    Prior Function Level of Independence: Needs assistance   Gait / Transfers Assistance Needed: walks with walker and caregiver supervision  ADL's / Homemaking Assistance Needed: assist for ADL's        Hand Dominance        Extremity/Trunk Assessment   Upper Extremity Assessment: Defer to OT evaluation           Lower Extremity Assessment: RLE deficits/detail RLE Deficits / Details: grossly 3/-5       Communication   Communication: HOH  Cognition Arousal/Alertness: Awake/alert Behavior During Therapy: WFL for tasks assessed/performed Overall Cognitive Status: Within Functional Limits for tasks assessed                      General Comments      Exercises General Exercises - Lower Extremity Ankle Circles/Pumps: AROM;Both;10 reps;Seated Long Arc Quad: AROM;Both;10 reps;Seated Hip Flexion/Marching: AROM;Both;10 reps;Seated      Assessment/Plan    PT Assessment Patient needs continued PT services  PT Diagnosis Generalized weakness   PT Problem List Decreased mobility;Decreased activity tolerance;Decreased balance;Decreased strength;Decreased knowledge of use of DME;Decreased safety awareness;Decreased knowledge of precautions  PT Treatment Interventions DME instruction;Gait training;Functional mobility training;Therapeutic activities;Therapeutic exercise;Balance training;Patient/family education   PT Goals (Current goals can be found in the Care Plan section) Acute Rehab PT Goals Patient Stated Goal: to get better PT Goal Formulation: With patient Time For Goal Achievement: 10/31/15 Potential to Achieve Goals: Fair    Frequency Min 3X/week   Barriers to discharge        Co-evaluation               End of Session Equipment Utilized During Treatment: Gait belt;Oxygen Activity Tolerance:  Patient limited by fatigue Patient left: in chair;with call bell/phone within reach;with chair alarm set Nurse Communication: Mobility status         Time: [id number] PT Time Calculation (min) (ACUTE ONLY): 26 min   Charges:   PT Evaluation $PT Eval Moderate Complexity: 1 Procedure PT Treatments $Gait Training: 8-22 mins   PT G CodesIrwin Brakeman F 10/21/15, 11:54 AM Amanda [facility] Acute Rehabilitation [phone] (pager)

## 2015-10-17 NOTE — Progress Notes (Signed)
SATURATION QUALIFICATIONS: (This note is used to comply with regulatory documentation for home oxygen)  Patient Saturations on Room Air at Rest = 87-90%  Patient Saturations on Room Air while Ambulating = 82-88%  Patient Saturations on 4 Liters of oxygen while Ambulating = 90-92%  Please briefly explain why patient needs home oxygen:Pt desat on RA.  Needed 4LO2 to keep sats >90% at rest and with ambulation. Nursing made aware.  Thanks.  [place] [phone] (pager)

## 2015-10-17 NOTE — Progress Notes (Signed)
Patient Name: Shaun [name] Date of Encounter: 10/17/2015  Active Problems:   Acute on chronic systolic (congestive) heart failure [facility])   Primary Cardiologist: Dr. Irish Lack  Patient Profile: Mr. Fiene is a 82 yo male with a PMH significant for CAD s/p CABG c/b inferior AMI, mod AS, CKD, HTN, and HLD. He presented to the ED on 10/15/15 with SOB and BLE edema.   SUBJECTIVE: Feels well this am, breathing much better. Denies orthopnea, PND. Denies chest pain.    OBJECTIVE Filed Vitals:   10/16/15 0604 10/16/15 1331 10/16/15 2103 10/17/15 0546  BP: 102/66 92/59 84/46  95/64  Pulse: 74 82 75 85  Temp: 98.3 F (36.8 C) 97.8 F (36.6 C) 97.6 F (36.4 C) 97.6 F (36.4 C)  TempSrc: Oral Oral Oral Oral  Resp: 17 16 16 18   Height:      Weight: 142 lb 3.1 oz (64.498 kg)   137 lb 1.6 oz (62.188 kg)  SpO2: 99% 97%  98%    Intake/Output Summary (Last 24 hours) at 10/17/15 0756 Last data filed at 10/17/15 0544  Gross per 24 hour  Intake    720 ml  Output   2000 ml  Net  -1280 ml   Filed Weights   10/15/15 1943 10/16/15 0604 10/17/15 0546  Weight: 142 lb 3.2 oz (64.501 kg) 142 lb 3.1 oz (64.498 kg) 137 lb 1.6 oz (62.188 kg)    PHYSICAL EXAM General: Well developed, well nourished, male in no acute distress. Head: Normocephalic, atraumatic.  Neck: Supple without bruits.  Lungs:  Resp regular and unlabored, scattered fine crackles in bases.  Heart: RRR, S1, S2, no S3, S4, or murmur; no rub. Abdomen: Soft, non-tender, non-distended, BS + x 4.  Extremities: No clubbing, cyanosis,+3 ankle edema.  Neuro: Alert and oriented X 3. Moves all extremities spontaneously. Psych: Normal affect.  LABS: CBC: Recent Labs  10/16/15 0340 10/17/15 0306  WBC 6.9 7.8  NEUTROABS 5.2 6.0  HGB 11.5* 11.3*  HCT 36.6* 37.2*  MCV 98.7 100.0  PLT 175 187   INR: Recent Labs  10/17/15 0306  INR [id number]   Basic Metabolic Panel: Recent Labs  10/16/15 0340 10/17/15 0306  NA 138 140    K 4.1 4.5  CL 99* 100*  CO2 30 30  GLUCOSE 93 94  BUN 30* 32*  CREATININE 1.32* 1.41*  CALCIUM 9.6 9.7  MG 2.3 2.3   Liver Function Tests: Recent Labs  10/16/15 0340 10/17/15 0306  AST 25 23  ALT 15* 13*  ALKPHOS 74 72  BILITOT 0.6 0.5  PROT 6.2* 6.4*  ALBUMIN 3.0* 2.9*   BNP:  B NATRIURETIC PEPTIDE  Date/Time Value Ref Range Status  10/15/2015 08:52 PM 1076.1* 0.0 - 100.0 pg/mL Final  10/15/2015 01:40 PM 1142.3* 0.0 - 100.0 pg/mL Final   Thyroid Function Tests: Recent Labs  10/15/15 2052  TSH 5.831*    Current facility-administered medications:  .  0.9 %  sodium chloride infusion, 250 mL, Intravenous, PRN, Jay Schlichter, MD .  acetaminophen (TYLENOL) tablet 650 mg, 650 mg, Oral, Q4H PRN, Jay Schlichter, MD .  amiodarone (PACERONE) tablet 200 mg, 200 mg, Oral, Daily, Jay Schlichter, MD, 200 mg at 10/16/15 0950 .  antiseptic oral rinse (CPC / CETYLPYRIDINIUM CHLORIDE 0.05%) solution 7 mL, 7 mL, Mouth Rinse, BID, Jay Schlichter, MD, 7 mL at 10/16/15 2133 .  aspirin EC tablet 81 mg, 81 mg, Oral, Daily, Jay Schlichter, MD, 81 mg at 10/16/15 0950 .  atorvastatin (LIPITOR) tablet 40 mg, 40 mg, Oral, Daily, Jay Schlichter, MD, 40 mg at 10/16/15 0950 .  cholecalciferol (VITAMIN D) tablet 2,000 Units, 2,000 Units, Oral, Daily, Jay Schlichter, MD, 2,000 Units at 10/16/15 0950 .  clopidogrel (PLAVIX) tablet 75 mg, 75 mg, Oral, Daily, Jay Schlichter, MD, 75 mg at 10/16/15 0950 .  docusate sodium (COLACE) capsule 200 mg, 200 mg, Oral, Daily, Jay Schlichter, MD, 200 mg at 10/16/15 0950 .  docusate sodium (COLACE) capsule 300 mg, 300 mg, Oral, QHS, Jay Schlichter, MD, 300 mg at 10/16/15 2124 .  furosemide (LASIX) injection 40 mg, 40 mg, Intravenous, BID, Arbutus Leas, NP, 40 mg at 10/16/15 1737 .  heparin injection 5,000 Units, 5,000 Units, Subcutaneous, Q8H, Jay Schlichter, MD, 5,000 Units at 10/17/15 0537 .  isosorbide mononitrate (IMDUR) 24 hr tablet 60 mg, 60 mg, Oral, Daily,  Jay Schlichter, MD, 60 mg at 10/16/15 0950 .  levothyroxine (SYNTHROID, LEVOTHROID) tablet 100 mcg, 100 mcg, Oral, QAC breakfast, Jay Schlichter, MD, 100 mcg at 10/17/15 0540 .  multivitamin with minerals tablet 1 tablet, 1 tablet, Oral, Daily, Jay Schlichter, MD, 1 tablet at 10/16/15 0949 .  ondansetron (ZOFRAN) injection 4 mg, 4 mg, Intravenous, Q6H PRN, Jay Schlichter, MD .  pantoprazole (PROTONIX) EC tablet 40 mg, 40 mg, Oral, Daily, Jay Schlichter, MD, 40 mg at 10/16/15 0950 .  polyvinyl alcohol (LIQUIFILM TEARS) 1.4 % ophthalmic solution 1 drop, 1 drop, Both Eyes, BID, Jay Schlichter, MD, 1 drop at 10/16/15 2131 .  potassium chloride SA (K-DUR,KLOR-CON) CR tablet 20 mEq, 20 mEq, Oral, BID, Arbutus Leas, NP, 20 mEq at 10/16/15 2124 .  senna-docusate (Senokot-S) tablet 1 tablet, 1 tablet, Oral, Daily, Jay Schlichter, MD, 1 tablet at 10/16/15 0950 .  senna-docusate (Senokot-S) tablet 2 tablet, 2 tablet, Oral, QHS, Jay Schlichter, MD, 2 tablet at 10/16/15 2123 .  sodium chloride flush (NS) 0.9 % injection 3 mL, 3 mL, Intravenous, Q12H, Jay Schlichter, MD, 3 mL at 10/16/15 2136 .  sodium chloride flush (NS) 0.9 % injection 3 mL, 3 mL, Intravenous, PRN, Jay Schlichter, MD    TELE:   NSR     ECG: NSR  Radiology/Studies: Dg Chest Port 1 View  10/16/2015  CLINICAL DATA:  Shortness of breath. EXAM: PORTABLE CHEST 1 VIEW COMPARISON:  08/10/2015. FINDINGS: Prior CABG. Stable cardiomegaly. Slight improvement of bilateral from interstitial prominence and pleural effusions suggesting slight improvement of congestive heart failure . No pneumothorax. IMPRESSION: 1. Prior CABG.  Stable cardiomegaly. 2. Bilateral pulmonary interstitial prominence and small pleural effusions consistent congestive heart failure. Slight improvement from prior exam . Electronically Signed   By: Marcello Moores  Register   On: 10/16/2015 07:24     Current Medications:  . amiodarone  200 mg Oral Daily  . antiseptic oral rinse  7 mL  Mouth Rinse BID  . aspirin EC  81 mg Oral Daily  . atorvastatin  40 mg Oral Daily  . cholecalciferol  2,000 Units Oral Daily  . clopidogrel  75 mg Oral Daily  . docusate sodium  200 mg Oral Daily  . docusate sodium  300 mg Oral QHS  . furosemide  40 mg Intravenous BID  . heparin  5,000 Units Subcutaneous Q8H  . isosorbide mononitrate  60 mg Oral Daily  . levothyroxine  100 mcg Oral QAC breakfast  . multivitamin with minerals  1 tablet Oral Daily  . pantoprazole  40 mg Oral Daily  . polyvinyl alcohol  1 drop Both Eyes BID  .  potassium chloride  20 mEq Oral BID  . senna-docusate  1 tablet Oral Daily  . senna-docusate  2 tablet Oral QHS  . sodium chloride flush  3 mL Intravenous Q12H      ASSESSMENT AND PLAN: Active Problems:   Acute on chronic systolic (congestive) heart failure ([place])  1. Acute on chronic systolic CHF: last Echo was in 2013 and showed LVEF of 40-45%, and moderate AS. Awaiting Echo to be done this admission. He is volume overloaded on exam with +3 ankle edema, JVD better today. His weight is down to 137 today. Creatinine has bumped today, his BP is soft in the 90's. Will switch to po lasix this afternoon.  He is on isosorbide, no room for BB given soft BP. No ACEI in setting of chronic renal insufficiency.   2. CAD: On Plavix after inferior MI in 2013. He is largely inactive and can no longer walk without assistance. Denies chest pain. On moderate dose statin.   3. Atrial tachycardia: Had atrial tachycardia following MI in 2013, Amio was started. He has continued with this. No tachycardia noted on tele. TSH is elevated at 5.831, liver enzymes ok.     Signed, Arbutus Leas , NP 7:56 AM 10/17/2015 Pager [phone]

## 2015-10-17 NOTE — Consult Note (Signed)
   [facility] Inpatient Consult   10/17/2015  Shaun [name] April 14, 1915 [id number]  Patient is currently active with [facility] for chronic disease management services.  Patient has been engaged by a [employer]. Patient is a 82 yo male with a HX of CAD s/p CABG c/b inferior AMI, mod AS, CKD, HTN, and HLD.  Met with the patient at bedside to confirm ongoing post hospital follow up with [facility].  Patient endorses ongoing follow up and will receive a post discharge transition of care call and will be evaluated for monthly home visits for assessments and disease process education.  Made Inpatient Case Manager aware that [facility] following in the progression meeting. Of note, [facility] Care Management services does not replace or interfere with any services that are needed or arranged by inpatient case management or social work.  For additional questions or referrals please contact:  Natividad Brood, RN BSN [facility] Liaison  [phone] business mobile phone Toll free office [phone]

## 2015-10-17 NOTE — Progress Notes (Signed)
  Echocardiogram 2D Echocardiogram has been performed.  Jennette Dubin 10/17/2015, 11:24 AM

## 2015-10-17 NOTE — Progress Notes (Signed)
OT Cancellation Note  Patient Details Name: Shaun [name] MRN: [id number] DOB: 10/11/1914   Cancelled Treatment:    Reason Eval/Treat Not Completed: Other (comment) Pt declined to get up right now--offered to assist pt to chair for lunch but he states he is not up for this right now. He states he is still worn out from PT and test earlier. Wife present. Will reattempt at a later time.  [place], [place] 10/17/2015, 12:26 PM

## 2015-10-17 NOTE — Progress Notes (Signed)
Called by RN.  Patient is SOB, diaphoretic. O2 sats are 93% on 3L. Crackles in bilateral lower lobes. Will give one dose of IV Lasix now.

## 2015-10-18 DIAGNOSIS — I10 Essential (primary) hypertension: Secondary | ICD-10-CM

## 2015-10-18 DIAGNOSIS — E785 Hyperlipidemia, unspecified: Secondary | ICD-10-CM

## 2015-10-18 LAB — PROTIME-INR
INR: 1.2 (ref 0.00–1.49)
Prothrombin Time: 15.4 seconds — ABNORMAL HIGH (ref 11.6–15.2)

## 2015-10-18 LAB — CBC WITH DIFFERENTIAL/PLATELET
BASOS ABS: 0 10*3/uL (ref 0.0–0.1)
BASOS PCT: 0 %
Eosinophils Absolute: 0 10*3/uL (ref 0.0–0.7)
Eosinophils Relative: 0 %
HEMATOCRIT: 38.4 % — AB (ref 39.0–52.0)
HEMOGLOBIN: 12.1 g/dL — AB (ref 13.0–17.0)
LYMPHS PCT: 9 %
Lymphs Abs: 0.8 10*3/uL (ref 0.7–4.0)
MCH: 32.1 pg (ref 26.0–34.0)
MCHC: 31.5 g/dL (ref 30.0–36.0)
MCV: 101.9 fL — AB (ref 78.0–100.0)
MONO ABS: 1.2 10*3/uL — AB (ref 0.1–1.0)
Monocytes Relative: 12 %
NEUTROS ABS: 7.4 10*3/uL (ref 1.7–7.7)
NEUTROS PCT: 79 %
Platelets: 158 10*3/uL (ref 150–400)
RBC: 3.77 MIL/uL — AB (ref 4.22–5.81)
RDW: 15.5 % (ref 11.5–15.5)
WBC: 9.4 10*3/uL (ref 4.0–10.5)

## 2015-10-18 LAB — BASIC METABOLIC PANEL
ANION GAP: 9 (ref 5–15)
BUN: 37 mg/dL — ABNORMAL HIGH (ref 6–20)
CALCIUM: 10 mg/dL (ref 8.9–10.3)
CO2: 29 mmol/L (ref 22–32)
Chloride: 102 mmol/L (ref 101–111)
Creatinine, Ser: 1.4 mg/dL — ABNORMAL HIGH (ref 0.61–1.24)
GFR, EST AFRICAN AMERICAN: 47 mL/min — AB (ref >60)
GFR, EST NON AFRICAN AMERICAN: 40 mL/min — AB (ref >60)
GLUCOSE: 96 mg/dL (ref 65–99)
POTASSIUM: 5.6 mmol/L — AB (ref 3.5–5.1)
Sodium: 140 mmol/L (ref 135–145)

## 2015-10-18 LAB — MAGNESIUM: MAGNESIUM: 2.3 mg/dL (ref 1.7–2.4)

## 2015-10-18 NOTE — Progress Notes (Signed)
SUBJECTIVE:  Slept well last night after getting morphine.  Less SOB today  OBJECTIVE:   Vitals:   Filed Vitals:   10/17/15 1521 10/17/15 2119 10/18/15 0708 10/18/15 1219  BP:  96/62 89/50 82/48   Pulse:  89 79 87  Temp:  97.8 F (36.6 C) 98.5 F (36.9 C) 98.2 F (36.8 C)  TempSrc:  Oral Oral Oral  Resp:  20 20 20   Height:      Weight:   137 lb 5.6 oz (62.3 kg)   SpO2: 92% 93% 98% 97%   I&O's:   Intake/Output Summary (Last 24 hours) at 10/18/15 1543 Last data filed at 10/18/15 1443  Gross per 24 hour  Intake    760 ml  Output    950 ml  Net   -190 ml   TELEMETRY: Reviewed telemetry pt in NSR:     PHYSICAL EXAM General: Well developed, well nourished, in no acute distress Head: Eyes PERRLA, No xanthomas.   Normal cephalic and atramatic  Lungs:   Crackles at bases bilaterally Heart:   HRRR S1 S2 Pulses are 2+ & equal. Abdomen: Bowel sounds are positive, abdomen soft and non-tender without masses Extremities:   No clubbing, cyanosis or edema.  DP +1 Neuro: Alert and oriented X 3. Psych:  Good affect, responds appropriately   LABS: Basic Metabolic Panel:  Recent Labs  10/17/15 0306 10/18/15 0513  NA 140 140  K 4.5 5.6*  CL 100* 102  CO2 30 29  GLUCOSE 94 96  BUN 32* 37*  CREATININE 1.41* 1.40*  CALCIUM 9.7 10.0  MG 2.3 2.3   Liver Function Tests:  Recent Labs  10/16/15 0340 10/17/15 0306  AST 25 23  ALT 15* 13*  ALKPHOS 74 72  BILITOT 0.6 0.5  PROT 6.2* 6.4*  ALBUMIN 3.0* 2.9*   No results for input(s): LIPASE, AMYLASE in the last 72 hours. CBC:  Recent Labs  10/17/15 0306 10/18/15 0513  WBC 7.8 9.4  NEUTROABS 6.0 7.4  HGB 11.3* 12.1*  HCT 37.2* 38.4*  MCV 100.0 101.9*  PLT 187 158   Cardiac Enzymes: No results for input(s): CKTOTAL, CKMB, CKMBINDEX, TROPONINI in the last 72 hours. BNP: Invalid input(s): POCBNP D-Dimer: No results for input(s): DDIMER in the last 72 hours. Hemoglobin A1C: No results for input(s): HGBA1C in  the last 72 hours. Fasting Lipid Panel: No results for input(s): CHOL, HDL, LDLCALC, TRIG, CHOLHDL, LDLDIRECT in the last 72 hours. Thyroid Function Tests:  Recent Labs  10/15/15 2052  TSH 5.831*   Anemia Panel: No results for input(s): VITAMINB12, FOLATE, FERRITIN, TIBC, IRON, RETICCTPCT in the last 72 hours. Coag Panel:   Lab Results  Component Value Date   INR 1.20 10/18/2015   INR 1.25 10/17/2015   INR 1.22 10/16/2015    RADIOLOGY: Dg Chest Port 1 View  10/16/2015  CLINICAL DATA:  Shortness of breath. EXAM: PORTABLE CHEST 1 VIEW COMPARISON:  08/10/2015. FINDINGS: Prior CABG. Stable cardiomegaly. Slight improvement of bilateral from interstitial prominence and pleural effusions suggesting slight improvement of congestive heart failure . No pneumothorax. IMPRESSION: 1. Prior CABG.  Stable cardiomegaly. 2. Bilateral pulmonary interstitial prominence and small pleural effusions consistent congestive heart failure. Slight improvement from prior exam . Electronically Signed   By: Marcello Moores  Register   On: 10/16/2015 07:24    ASSESSMENT AND PLAN: Active Problems:  Acute on chronic systolic (congestive) heart failure ([place])  1. Acute on chronic systolic CHF: last Echo was in 2013 and  showed LVEF of 40-45%, and moderate AS. 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 He is volume overloaded on exam with +3 ankle edema.  His weight same at 137 today. Creatinine remains stable and his BP is soft in the 80's. He put out 900cc with IV Lasix yesterday.  Now on po lasix this . Stop Imdur in setting of severe AS and hypotension.   No ACEI in setting of chronic renal insufficiency.   2. CAD: On Plavix after inferior MI in 2013. He is largely inactive and can no longer walk  without assistance. Denies chest pain. On moderate dose statin.   3. Atrial tachycardia: Had atrial tachycardia following MI in 2013, Amio was started. He has continued with this. No tachycardia noted on tele. TSH is elevated at 5.831, liver enzymes ok  4.  Palliative Care - prognosis very poor with mild LV dysfunction with severe AS and CHF with no options for definitive treatment given advanced age and comorbidities.  He is DNR and appreciate Palliative Care Consult.  Patient wants comfort measures only.  He wants to go home with Hospice which is being set up.  Will use Lasix, morphine and Ativan as needed.     Sueanne Margarita, MD  10/18/2015  3:43 PM

## 2015-10-18 NOTE — Evaluation (Signed)
Occupational Therapy Evaluation Patient Details Name: Shaun [name] MRN: [id number] DOB: 09/10/1914 Today's Date: 10/18/2015    History of Present Illness Shaun [name] is a 82 yo male with a PMH significant for CAD s/p CABG c/b inferior AMI, mod AS, CKD, HTN, and HLD. He presented to the ED on 10/15/15 with SOB and BLE edema--now plan is home with hospice care (not all family know--even wife does not know as of 4/29)   Clinical Impression   This 82 yo male admitted with above presents to acute OT with deficits below affecting his PLOF of S for ambulation with rollator and setup/S for UB ADLs. He will benefit from acute OT to work on increasing mobility for basic ADLs to decrease burden on caregivers and keep pt as independent as possible for as long as possible.    Follow Up Recommendations  Home health OT    Equipment Recommendations  3 in 1 bedside comode;Wheelchair (measurements OT);Wheelchair cushion (measurements OT)       Precautions / Restrictions Precautions Precautions: Fall Required Braces or Orthoses: Other Brace/Splint Other Brace/Splint: Pt normally wears an upright knee brace for RLE when he ambulates--family to hopefully bring it in on 10/20/2015. Restrictions Weight Bearing Restrictions: No      Mobility Bed Mobility Overal bed mobility: Needs Assistance Bed Mobility: Supine to Sit     Supine to sit: Min assist (with Mod A to scoot forward to EOB)        Transfers Overall transfer level: Needs assistance Equipment used:  (therapist standing in front of him with gait belt) Transfers: Sit to/from [facility] Sit to Stand: Mod assist Stand pivot transfers: Mod assist            Balance Overall balance assessment: Needs assistance Sitting-balance support: Bilateral upper extremity supported;Feet supported Sitting balance-Leahy Scale: Poor Sitting balance - Comments: Still with tendency for posterior lean even with Bil UE support    Standing balance support: Bilateral upper extremity supported Standing balance-Leahy Scale: Poor Standing balance comment: Reliant on Bil UE for support in standing                            ADL Overall ADL's : Needs assistance/impaired Eating/Feeding: Set up;Supervision/ safety (supported sitting)   Grooming: Supervision/safety;Set up (supported sitting)   Upper Body Bathing: Set up;Supervision/ safety (supported sitting)   Lower Body Bathing: Maximal assistance (Mod A sit<>stand)   Upper Body Dressing : Moderate assistance (supported sitting)   Lower Body Dressing: Total assistance (Mod A sit<>stand)   Toilet Transfer: Moderate assistance;Stand-pivot (bed>recliner going to pt's right)   Toileting- Clothing Manipulation and Hygiene: Total assistance (Mod A sit<>stand)                         Pertinent Vitals/Pain Pain Assessment: Faces Faces Pain Scale: Hurts little more Pain Location: right knee Pain Descriptors / Indicators: Aching;Sore (with weight bearing) Pain Intervention(s): Limited activity within patient's tolerance;Repositioned        Extremity/Trunk Assessment Upper Extremity Assessment Upper Extremity Assessment: Generalized weakness              Cognition Arousal/Alertness: Awake/alert Behavior During Therapy: WFL for tasks assessed/performed Overall Cognitive Status: Within Functional Limits for tasks assessed                                Home Living  Family/patient expects to be discharged to:: Private residence Living Arrangements: Spouse/significant other Available Help at Discharge: Personal care attendant;Family;Available 24 hours/day Type of Home: House Home Access: Ramped entrance     Home Layout: One level     Bathroom Shower/Tub: Occupational psychologist: Standard     Home Equipment: Wheelchair - manual;Grab bars - toilet;Grab bars - tub/shower;Toilet riser;Walker - 2 wheels;Walker - 4  wheels;Cane - single point;Shower seat (2 liters of O2, craftmatic adjustable bed)   Additional Comments: has 24 hour care givers 7 days a week      Prior Functioning/Environment Level of Independence: Needs assistance  Gait / Transfers Assistance Needed: walks with walker and caregiver supervision ADL's / Homemaking Assistance Needed: assist for ADL's--he could do UB, but caregivers did LB and helped him in and out of shower        OT Diagnosis: Generalized weakness   OT Problem List: Decreased strength;Impaired balance (sitting and/or standing);Pain;Decreased knowledge of use of DME or AE;Decreased activity tolerance   OT Treatment/Interventions: Self-care/ADL training;Patient/family education;Balance training;Therapeutic activities;DME and/or AE instruction    OT Goals(Current goals can be found in the care plan section) Acute Rehab OT Goals Patient Stated Goal: to be able to go home OT Goal Formulation: With patient Time For Goal Achievement: 10/25/15 Potential to Achieve Goals: Good  OT Frequency: Min 2X/week              End of Session Equipment Utilized During Treatment: Gait belt;Oxygen (2 liters) Nurse Communication: Mobility status (NT: He had already gotten pt up to [place] today)  Activity Tolerance:  (a little DOE post transfer, cued for purse lipped breathing) Patient left: in chair;with call bell/phone within reach;with chair alarm set;with family/visitor present   Time: [id number] OT Time Calculation (min): 30 min Charges:  OT General Charges $OT Visit: 1 Procedure OT Evaluation $OT Eval Moderate Complexity: 1 Procedure OT Treatments $Self Care/Home Management : 8-22 mins  Almon Register [id number] 10/18/2015, 12:37 PM

## 2015-10-19 LAB — CBC WITH DIFFERENTIAL/PLATELET
BASOS ABS: 0 10*3/uL (ref 0.0–0.1)
BASOS PCT: 0 %
EOS ABS: 0.1 10*3/uL (ref 0.0–0.7)
Eosinophils Relative: 1 %
HEMATOCRIT: 38 % — AB (ref 39.0–52.0)
HEMOGLOBIN: 11.7 g/dL — AB (ref 13.0–17.0)
Lymphocytes Relative: 8 %
Lymphs Abs: 0.8 10*3/uL (ref 0.7–4.0)
MCH: 31.2 pg (ref 26.0–34.0)
MCHC: 30.8 g/dL (ref 30.0–36.0)
MCV: 101.3 fL — ABNORMAL HIGH (ref 78.0–100.0)
Monocytes Absolute: 1.1 10*3/uL — ABNORMAL HIGH (ref 0.1–1.0)
Monocytes Relative: 12 %
NEUTROS PCT: 79 %
Neutro Abs: 7.3 10*3/uL (ref 1.7–7.7)
Platelets: 186 10*3/uL (ref 150–400)
RBC: 3.75 MIL/uL — ABNORMAL LOW (ref 4.22–5.81)
RDW: 15.2 % (ref 11.5–15.5)
WBC: 9.2 10*3/uL (ref 4.0–10.5)

## 2015-10-19 LAB — BASIC METABOLIC PANEL
Anion gap: 8 (ref 5–15)
BUN: 39 mg/dL — AB (ref 6–20)
CHLORIDE: 100 mmol/L — AB (ref 101–111)
CO2: 30 mmol/L (ref 22–32)
CREATININE: 1.41 mg/dL — AB (ref 0.61–1.24)
Calcium: 9.7 mg/dL (ref 8.9–10.3)
GFR calc Af Amer: 46 mL/min — ABNORMAL LOW (ref >60)
GFR calc non Af Amer: 40 mL/min — ABNORMAL LOW (ref >60)
Glucose, Bld: 138 mg/dL — ABNORMAL HIGH (ref 65–99)
Potassium: 3.7 mmol/L (ref 3.5–5.1)
SODIUM: 138 mmol/L (ref 135–145)

## 2015-10-19 LAB — MAGNESIUM: Magnesium: 2.2 mg/dL (ref 1.7–2.4)

## 2015-10-19 LAB — PROTIME-INR
INR: 1.21 (ref 0.00–1.49)
PROTHROMBIN TIME: 15.4 s — AB (ref 11.6–15.2)

## 2015-10-19 MED ORDER — MORPHINE SULFATE 15 MG PO TABS: 15.0000 mg | ORAL_TABLET | ORAL | Status: AC | PRN

## 2015-10-19 MED ORDER — ONDANSETRON 4 MG PO TBDP: 4.0000 mg | ORAL_TABLET | Freq: Three times a day (TID) | ORAL | Status: AC | PRN

## 2015-10-19 MED ORDER — LORAZEPAM 0.5 MG PO TABS: 0.5000 mg | ORAL_TABLET | Freq: Three times a day (TID) | ORAL | Status: AC

## 2015-10-19 NOTE — Progress Notes (Signed)
SUBJECTIVE:  Mild SOB  OBJECTIVE:   Vitals:   Filed Vitals:   10/18/15 0708 10/18/15 1219 10/18/15 2233 10/19/15 0454  BP: 89/50 82/48 95/53  108/70  Pulse: 79 87 88 61  Temp: 98.5 F (36.9 C) 98.2 F (36.8 C) 98 F (36.7 C) 98.1 F (36.7 C)  TempSrc: Oral Oral Oral Oral  Resp: 20 20 20 20   Height:      Weight: 137 lb 5.6 oz (62.3 kg)   139 lb 1.8 oz (63.1 kg)  SpO2: 98% 97% 100% 95%   I&O's:   Intake/Output Summary (Last 24 hours) at 10/19/15 1131 Last data filed at 10/19/15 1017  Gross per 24 hour  Intake    540 ml  Output    575 ml  Net    -35 ml   TELEMETRY: Reviewed telemetry pt in SNR:     PHYSICAL EXAM General: Well developed, well nourished, in no acute distress Head: Eyes PERRLA, No xanthomas.   Normal cephalic and atramatic  Lungs:   Clear bilaterally to auscultation and percussion. Heart:   HRRR S1 S2 Pulses are 2+ & equal. Abdomen: Bowel sounds are positive, abdomen soft and non-tender without masses  Extremities:   No clubbing, cyanosis .  DP +1.  LE edema 1+ Neuro: Alert and oriented X 3. Psych:  Good affect, responds appropriately   LABS: Basic Metabolic Panel:  Recent Labs  10/17/15 0306 10/18/15 0513 10/19/15 0350  NA 140 140  --   K 4.5 5.6*  --   CL 100* 102  --   CO2 30 29  --   GLUCOSE 94 96  --   BUN 32* 37*  --   CREATININE 1.41* 1.40*  --   CALCIUM 9.7 10.0  --   MG 2.3 2.3 2.2   Liver Function Tests:  Recent Labs  10/17/15 0306  AST 23  ALT 13*  ALKPHOS 72  BILITOT 0.5  PROT 6.4*  ALBUMIN 2.9*   No results for input(s): LIPASE, AMYLASE in the last 72 hours. CBC:  Recent Labs  10/18/15 0513 10/19/15 0350  WBC 9.4 9.2  NEUTROABS 7.4 7.3  HGB 12.1* 11.7*  HCT 38.4* 38.0*  MCV 101.9* 101.3*  PLT 158 186   Cardiac Enzymes: No results for input(s): CKTOTAL, CKMB, CKMBINDEX, TROPONINI in the last 72 hours. BNP: Invalid input(s): POCBNP D-Dimer: No results for input(s): DDIMER in the last 72  hours. Hemoglobin A1C: No results for input(s): HGBA1C in the last 72 hours. Fasting Lipid Panel: No results for input(s): CHOL, HDL, LDLCALC, TRIG, CHOLHDL, LDLDIRECT in the last 72 hours. Thyroid Function Tests: No results for input(s): TSH, T4TOTAL, T3FREE, THYROIDAB in the last 72 hours.  Invalid input(s): FREET3 Anemia Panel: No results for input(s): VITAMINB12, FOLATE, FERRITIN, TIBC, IRON, RETICCTPCT in the last 72 hours. Coag Panel:   Lab Results  Component Value Date   INR 1.21 10/19/2015   INR 1.20 10/18/2015   INR 1.25 10/17/2015    RADIOLOGY: Dg Chest Port 1 View  10/16/2015  CLINICAL DATA:  Shortness of breath. EXAM: PORTABLE CHEST 1 VIEW COMPARISON:  08/10/2015. FINDINGS: Prior CABG. Stable cardiomegaly. Slight improvement of bilateral from interstitial prominence and pleural effusions suggesting slight improvement of congestive heart failure . No pneumothorax. IMPRESSION: 1. Prior CABG.  Stable cardiomegaly. 2. Bilateral pulmonary interstitial prominence and small pleural effusions consistent congestive heart failure. Slight improvement from prior exam . Electronically Signed   By: Marcello Moores  Register   On: 10/16/2015 07:24  ASSESSMENT AND PLAN: Active Problems:  Acute on chronic systolic (congestive) heart failure ([place])  1. Acute on chronic systolic CHF: last Echo was in 2013 and showed LVEF of 40-45%, and moderate AS. 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 He is volume overloaded on exam with +3 ankle edema. His weight same at 137 today. Creatinine remains stable and his BP is soft but improved today. He put out 675cc with PO Lasix yesterday. Imdur stopped in setting of severe AS and hypotension. No ACEI in setting of chronic renal  insufficiency.   2. CAD: On Plavix after inferior MI in 2013. He is largely inactive and can no longer walk without assistance. Denies chest pain. On moderate dose statin.   3. Atrial tachycardia: Had atrial tachycardia following MI in 2013, Amio was started. He has continued with this. No tachycardia noted on tele. TSH is elevated at 5.831, liver enzymes ok  4. Palliative Care - prognosis very poor with mild LV dysfunction with severe AS and CHF with no options for definitive treatment given advanced age and comorbidities. He is DNR and appreciate Palliative Care Consult. Patient wants comfort measures only. He wants to go home with Hospice which is being set up. Will use Lasix, morphine and Ativan as needed. Hoping that [place] can be set up today as patient wants to go home today.      Sueanne Margarita, MD  10/19/2015  11:31 AM

## 2015-10-19 NOTE — Discharge Summary (Signed)
Pt got discharged to home, discharge instructions provided and patient showed understanding to it, IV taken out,Telemonitor DC,pt left unit in wheelchair with all of the belongings accompanied with a family member (Daughter) with 2l oxygen on via nasal canula.

## 2015-10-19 NOTE — Progress Notes (Signed)
Notified by [facility] of family request for Hospice and [place] services at home after discharge. Chart and patient information currently under review to confirm hospice eligibility.   Spoke with family, at bedside to initiate education related to hospice philosophy, services and team approach to care. Family verbalized understanding of the information provided.   Please send signed completed DNR form home with patient.  Patient will need prescriptions for discharge comfort medications.   DME needs discussed and family denies any needs at this time.  Completed discharge summary will need to be faxed to [facility] at [phone] when final.  Please notify [facility] when patient is ready to leave unit at discharge-call [phone].   [facility] information and contact numbers have been given to [place] during visit.   Please call with any questions.  Thank You,  Freddi Starr RN, [facility] Liaison  [phone]

## 2015-10-19 NOTE — Discharge Summary (Signed)
Discharge Summary    Patient ID: Shaun [name],  MRN: [id number], DOB/AGE: Nov 15, 1914 82 y.o.  Admit date: 10/15/2015 Discharge date: 10/19/2015  Primary Care Provider: Donnie Coffin Primary Cardiologist: Dr. Irish Lack  Discharge Diagnoses    Principal Problem:   Acute on chronic systolic CHF (congestive heart failure) ([place]) Active Problems:   HTN (hypertension)   Aortic stenosis, severe   Hyperlipidemia   CAD (coronary artery disease)   Allergies Allergies  Allergen Reactions  . Cocaine Other (See Comments)    Reaction: "passed out" from 1970's septum surgery    Diagnostic Studies/Procedures    Echo 10/16/2012 LV EF: 40% - 45%  ------------------------------------------------------------------- Indications: CHF - 428.0.  ------------------------------------------------------------------- History: PMH: Dyspnea and murmur. Coronary artery disease. PMH: Myocardial infarction. Risk factors: Former tobacco use. Hypertension.  ------------------------------------------------------------------- Study Conclusions  - Left ventricle: The cavity size was normal. There was mild  concentric hypertrophy. Systolic function was mildly to  moderately reduced. The estimated ejection fraction was in the  range of 40% to 45%. Akinesis and aneurysmal deformity of the  basallateral and inferior myocardium. 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 Features are  consistent with a pseudonormal left ventricular filling pattern,  with concomitant abnormal relaxation and increased filling  pressure (grade 2 diastolic dysfunction). - Aortic valve: Transvalvular velocity was increased less than  expected, due to low cardiac output. There was severe stenosis.  There is &quot;low gradient severe aortic stenosis&quot;. There was mild  regurgitation. Valve area (VTI):  0.42 cm^2. Valve area (Vmax):  0.42 cm^2. Valve area (Vmean): 0.4 cm^2. - Mitral valve: Calcified annulus. Mildly thickened, mildly  calcified leaflets anterior greater than posterior. There was  mild regurgitation. Diastolic regurgitation was present. - Left atrium: The atrium was mildly dilated. - Right ventricle: Systolic function was reduced. - Pulmonary arteries: Systolic pressure was moderately increased.  PA peak pressure: 59 mm Hg (S).  Impressions:  - Comparison is made with the study from 2013. There was a  &quot;posterior pericardial effusion&quot; that may represent the same  abnormality described above, but the basal segment of the  inferior wall showed aneurysmal change without clear evidence of  psudoaneurysm. _____________   History of Present Illness     Pt has a longstanding cardiac hx. Follows w/ Dr. Irish Lack. Has been followed for CAD and mod AS. Last hospitalized for HF 07/2015. Gentle diuresis and discharged on lasix 60 mg PO bid. Required home O2 w/ ambulation at discharge. Echo at the time showed mild-moderately reduced LVEF and mod AS by peak velocity/mean gradient and AVA. For the past few weeks has been feeling progressively SOB. Even at rest with conversation. Any amount of exertion is difficult. Very fatigued. Endorses orthopnea and PND. Progressive BLE swelling. Taking lasix 80 mg PO bid per family. Had labs checked today which were notable for rising BNP. Admitted for presumptive diagnosis of HF.  Hospital Course     Consultants: Palliative Care   His dry weight was 140 pounds. He was 142 pounds on admission. 80 mg IV Lasix on admission and diuresed 500 mL and felt better afterward. He was started on 40 mg twice a day of IV Lasix. Chest x-ray obtained on 10/16/2015 showed bilateral pulmonary interstitial prominence and a small pleural effusion consistent with congestive heart failure. 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  He was changed to 80 mg PO BID lasix on 4/28. That same afternoon, patient became short of breath and diaphoretic. O2 saturation was 93% on 3 L. Crackles were noted in bilateral lower lobes. He received 1 more dose of IV Lasix. It was felt the likely etiology for heart failure exacerbation was mild LV dysfunction in setting of severe aortic stenosis. A long discussion was held between Dr. Radford Pax and the family, family opted for comfort measure with diuretics and morphine as needed to help with shortness breath. Palliative Care was consulted on 4/28. Imdur was stopped in the setting of severe AS and hypotension. After discussing with palliative care, patient decided to go home with home hospice. Patient is a DO NOT RESUSCITATE. We'll use Lasix, morphine and Ativan as needed.   _____________  Discharge Vitals Blood pressure 95/56, pulse 89, temperature 98.5 F (36.9 C), temperature source Oral, resp. rate 20, height 5\' 3"  (1.6 m), weight 139 lb 1.8 oz (63.1 kg), SpO2 92 %.  Filed Weights   10/17/15 0546 10/18/15 0708 10/19/15 0454  Weight: 137 lb 1.6 oz (62.188 kg) 137 lb 5.6 oz (62.3 kg) 139 lb 1.8 oz (63.1 kg)    Labs & Radiologic Studies     CBC  Recent Labs  10/18/15 0513 10/19/15 0350  WBC 9.4 9.2  NEUTROABS 7.4 7.3  HGB 12.1* 11.7*  HCT 38.4* 38.0*  MCV 101.9* 101.3*  PLT 158 [id number]   Basic Metabolic Panel  Recent Labs  10/18/15 0513 10/19/15 0350 10/19/15 1201  NA 140  --  138  K 5.6*  --  3.7  CL 102  --  100*  CO2 29  --  30  GLUCOSE 96  --  138*  BUN 37*  --  39*  CREATININE 1.40*  --  1.41*  CALCIUM 10.0  --  9.7  MG 2.3 2.2  --    Liver Function  Tests  Recent Labs  10/17/15 0306  AST 23  ALT 13*  ALKPHOS 72  BILITOT 0.5  PROT 6.4*  ALBUMIN 2.9*    Dg Chest Port 1 View  10/16/2015  CLINICAL DATA:  Shortness of breath. EXAM: PORTABLE CHEST 1 VIEW COMPARISON:  08/10/2015. FINDINGS: Prior CABG. Stable cardiomegaly. Slight improvement of bilateral from interstitial prominence and pleural effusions suggesting slight improvement of congestive heart failure . No pneumothorax. IMPRESSION: 1. Prior CABG.  Stable cardiomegaly. 2. Bilateral pulmonary interstitial prominence and small pleural effusions consistent congestive heart failure. Slight improvement from prior exam . Electronically Signed   By: Marcello Moores  Register   On: 10/16/2015 07:24    Disposition   Pt is being discharged home with home Hospice  Follow-up Plans & Appointments    Follow-up Information    Schedule an appointment as soon as possible for a visit with Jettie Booze., MD.   Specialties:  Cardiology, Radiology, Interventional Cardiology   Why:  Contact Dr. Hassell Done office on an as needed basis   Contact information:   [id number] [place] [address] [phone]      Discharge Instructions    Amb Referral to Phase II Cardiac  Rehab    Complete by:  As directed   Diagnosis:  Heart Failure (see criteria below if ordering Phase  II)  Heart Failure Type:  Chronic Systolic     Diet - low sodium heart healthy    Complete by:  As directed      Increase activity slowly    Complete by:  As directed            Discharge Medications   Current Discharge Medication List    START taking these medications   Details  LORazepam (ATIVAN) 0.5 MG tablet Take 1 tablet (0.5 mg total) by mouth every 8 (eight) hours. Qty: 10 tablet, Refills: 0    morphine (MSIR) 15 MG tablet Take 1 tablet (15 mg total) by mouth every 4 (four) hours as needed for severe pain. Qty: 10 tablet, Refills: 0    ondansetron (ZOFRAN ODT) 4 MG disintegrating tablet  Take 1 tablet (4 mg total) by mouth every 8 (eight) hours as needed for nausea or vomiting. Qty: 20 tablet, Refills: 0      CONTINUE these medications which have NOT CHANGED   Details  amiodarone (PACERONE) 200 MG tablet Take 1 tablet (200 mg total) by mouth daily. Qty: 90 tablet, Refills: 2    aspirin EC 81 MG tablet Take 81 mg by mouth daily.    atorvastatin (LIPITOR) 40 MG tablet Take 1 tablet (40 mg total) by mouth daily. Qty: 90 tablet, Refills: 1    clopidogrel (PLAVIX) 75 MG tablet TAKE 1 TABLET DAILY Qty: 90 tablet, Refills: 3    docusate sodium (COLACE) 100 MG capsule Take 200-300 mg by mouth 2 (two) times daily. Takes 2 caps in am and 3 caps in pm    furosemide (LASIX) 40 MG tablet Take 1.5 tablets (60 mg total) by mouth daily at 12 noon. Qty: 180 tablet, Refills: 0    ipratropium (ATROVENT) 0.03 % nasal spray Place 2 sprays into both nostrils daily as needed for rhinitis.    Misc Natural Products (NARCOSOFT HERBAL LAX PO) Take 2 tablets by mouth 2 (two) times daily.     Misc Natural Products (OSTEO BI-FLEX ADV JOINT SHIELD) TABS Take 1 tablet by mouth 2 (two) times daily.    Multiple Vitamins-Minerals (MULTIVITAMINS THER. W/MINERALS) TABS Take 1 tablet by mouth every evening.     Multiple Vitamins-Minerals (PRESERVISION AREDS 2 PO) Take 1 tablet by mouth 2 (two) times daily.    nitroGLYCERIN (NITROSTAT) 0.4 MG SL tablet Place 1 tablet (0.4 mg total) under the tongue every 5 (five) minutes as needed. For chest pain. Qty: 30 tablet, Refills: 5    omeprazole (PRILOSEC) 20 MG capsule Take 1 capsule (20 mg total) by mouth daily.    Polyethyl Glycol-Propyl Glycol (SYSTANE) 0.4-0.3 % SOLN Apply 1 drop to eye 2 (two) times daily. In the afternoon use 1 drop both eyes. In the evening use 2 drops in the left eye, and 1 drop in the right eye    potassium citrate (UROCIT-K) 10 MEQ (1080 MG) SR tablet Take 10 mEq by mouth 2 (two) times daily.     senna-docusate (SENOKOT-S)  8.6-50 MG tablet Take 1-2 tablets by mouth 2 (two) times daily. Takes 1 tab in am and 2 tabs in pm    SYNTHROID 100 MCG tablet Take 100 mcg by mouth daily before breakfast.       STOP taking these medications     isosorbide mononitrate (IMDUR) 60 MG 24 hr tablet      Cholecalciferol 1000 units capsule            Outstanding Labs/Studies   None  Duration of Discharge Encounter   Greater than 30 minutes including physician time.  Signed, Almyra Deforest PA-C 10/19/2015, 4:43 PM  Agree with discharge summary as outlined by Almyra Deforest PA wth minor changes.  Signed: Fransico Him, MD [facility]  10/19/2015

## 2015-10-19 NOTE — Progress Notes (Signed)
Pt's oxygen is weaned to 2l from 4ll and his current oxygen sat is 94% on 2l/min

## 2015-10-19 NOTE — Progress Notes (Addendum)
CM spoke to [facility] with [facility] [place] who accepted patient and said that a nurse will be out tonight to admit to Hospice and family was agreeable to discharge today. Cm faxed discharge summary to Hospice agency @ [phone] and confirmation received. Cm called [facility] to advise of patient going home with hospice with oxygen and patient previously active with [facility] for [facility]. CM called [facility] @ [phone] and spoke with Estrella Myrtle to advise. Cm called extension [phone] to alert also and message accepted. CM communicated information to [facility] with Hospice since patient has a home filled o2 setup so hospice needs to let them know if it needs to be switched out or if it is approved.

## 2015-10-19 NOTE — Progress Notes (Signed)
Cm received call from [facility] who accepted the referral and said that she will set up the home hospice. CM advised that family would like to have patient discharged today and she said she would see the patient today and see what she can do about seeing patient today.

## 2015-10-19 NOTE — Care Management Note (Addendum)
Case Management Note  Patient Details  Name: Shaun [name] MRN: [id number] Date of Birth: 16-May-1915  Subjective/Objective:                  PMH  CAD s/p CABG, AMI, AS, CKD, HTN, and HLD. To ED on 10/15/15 with SOB and BLE edema. Dx with severe AS and CHF.    Action/Plan: Cm spoke to patient, spouse, and adult children at the bedside. Patient alert and involved with conversation involving home hospice discharge plan. CM spoke to patient and family about choice of Home hospice agency and they said that they are aware of the other agencies but would prefer to use [facility]. CM called Hospice agency @ [phone] and provided callback number. Awaiting return call. CM spoke to patient and family about DME and they said they already have an adjustable bed and do not need a hospital bed. They said they also have a RW, 3N1, and wheelchair. No further DME needs identified at this time. CM will continue to follow for discharge planning needs.   Expected Discharge Date:  10/19/15               Expected Discharge Plan:  Home w Hospice Care  In-House Referral:     Discharge planning Services  CM Consult  Post Acute Care Choice:  Hospice Choice offered to:  Patient, Spouse, Adult Children  DME Arranged:    DME Agency:  [facility] [place]:    [facility] Agency:  [facility]  Status of Service:  In process, will continue to follow  Medicare Important Message Given:    Date Medicare IM Given:    Medicare IM give by:    Date Additional Medicare IM Given:    Additional Medicare Important Message give by:     If discussed at [place] of Stay Meetings, dates discussed:    Additional Comments:  Guido Sander, RN 10/19/2015, 10:15 AM

## 2015-10-20 ENCOUNTER — Other Ambulatory Visit: Payer: Self-pay

## 2015-10-20 NOTE — Patient Outreach (Signed)
Received communications from [place] this [facility] on disposition from acute care. Patient has elected community care coordination from [facility].  Plan: Discharge from caseload

## 2015-10-29 ENCOUNTER — Other Ambulatory Visit: Payer: Self-pay | Admitting: Interventional Cardiology

## 2015-12-05 ENCOUNTER — Ambulatory Visit: Payer: MEDICARE | Admitting: Interventional Cardiology

## 2016-02-20 DEATH — deceased
